# Patient Record
Sex: Female | Born: 1940
Health system: Southern US, Community
[De-identification: ages and names within clinical notes are randomized; demographics above are authoritative.]

## PROBLEM LIST (undated history)

## (undated) DIAGNOSIS — I341 Nonrheumatic mitral (valve) prolapse: Secondary | ICD-10-CM

## (undated) DIAGNOSIS — I350 Nonrheumatic aortic (valve) stenosis: Secondary | ICD-10-CM

## (undated) DIAGNOSIS — M1990 Unspecified osteoarthritis, unspecified site: Secondary | ICD-10-CM

## (undated) DIAGNOSIS — I420 Dilated cardiomyopathy: Secondary | ICD-10-CM

## (undated) DIAGNOSIS — B019 Varicella without complication: Secondary | ICD-10-CM

## (undated) DIAGNOSIS — I219 Acute myocardial infarction, unspecified: Secondary | ICD-10-CM

## (undated) DIAGNOSIS — D649 Anemia, unspecified: Secondary | ICD-10-CM

## (undated) DIAGNOSIS — I252 Old myocardial infarction: Secondary | ICD-10-CM

## (undated) DIAGNOSIS — I255 Ischemic cardiomyopathy: Secondary | ICD-10-CM

## (undated) DIAGNOSIS — I351 Nonrheumatic aortic (valve) insufficiency: Secondary | ICD-10-CM

## (undated) DIAGNOSIS — E7800 Pure hypercholesterolemia, unspecified: Secondary | ICD-10-CM

## (undated) DIAGNOSIS — IMO0001 Reserved for inherently not codable concepts without codable children: Secondary | ICD-10-CM

## (undated) HISTORY — DX: Nonrheumatic mitral (valve) prolapse: I34.1

## (undated) HISTORY — DX: Reserved for inherently not codable concepts without codable children: IMO0001

## (undated) HISTORY — DX: Dilated cardiomyopathy: I42.0

## (undated) HISTORY — DX: Pure hypercholesterolemia, unspecified: E78.00

## (undated) HISTORY — DX: Acute myocardial infarction, unspecified: I21.9

## (undated) HISTORY — DX: Old myocardial infarction: I25.2

## (undated) HISTORY — DX: Varicella without complication: B01.9

## (undated) HISTORY — DX: Ischemic cardiomyopathy: I25.5

## (undated) HISTORY — DX: Unspecified osteoarthritis, unspecified site: M19.90

## (undated) HISTORY — DX: Nonrheumatic aortic (valve) insufficiency: I35.1

## (undated) HISTORY — DX: Anemia, unspecified: D64.9

## (undated) HISTORY — DX: Nonrheumatic aortic (valve) stenosis: I35.0

---
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
## 1988-11-25 HISTORY — PX: CHOLECYSTECTOMY: SHX55

## 1989-11-25 HISTORY — PX: OTHER SURGICAL HISTORY: SHX169

## 1994-11-25 HISTORY — PX: OTHER SURGICAL HISTORY: SHX169

## 2000-04-02 ENCOUNTER — Other Ambulatory Visit: Admission: RE | Admit: 2000-04-02 | Discharge: 2000-04-02 | Payer: Self-pay | Admitting: Family Medicine

## 2000-06-04 ENCOUNTER — Encounter: Admission: RE | Admit: 2000-06-04 | Discharge: 2000-06-04 | Payer: Self-pay | Admitting: Family Medicine

## 2000-06-04 ENCOUNTER — Encounter: Payer: Self-pay | Admitting: Family Medicine

## 2001-07-31 ENCOUNTER — Encounter: Payer: Self-pay | Admitting: Family Medicine

## 2001-07-31 ENCOUNTER — Encounter: Admission: RE | Admit: 2001-07-31 | Discharge: 2001-07-31 | Payer: Self-pay | Admitting: Family Medicine

## 2001-12-04 ENCOUNTER — Other Ambulatory Visit: Admission: RE | Admit: 2001-12-04 | Discharge: 2001-12-04 | Payer: Self-pay | Admitting: Family Medicine

## 2003-08-02 ENCOUNTER — Encounter: Payer: Self-pay | Admitting: Family Medicine

## 2003-08-02 ENCOUNTER — Encounter: Admission: RE | Admit: 2003-08-02 | Discharge: 2003-08-02 | Payer: Self-pay | Admitting: Family Medicine

## 2004-06-14 ENCOUNTER — Other Ambulatory Visit: Admission: RE | Admit: 2004-06-14 | Discharge: 2004-06-14 | Payer: Self-pay | Admitting: Family Medicine

## 2004-06-14 ENCOUNTER — Encounter: Payer: Self-pay | Admitting: Family Medicine

## 2004-08-02 ENCOUNTER — Encounter: Admission: RE | Admit: 2004-08-02 | Discharge: 2004-08-02 | Payer: Self-pay | Admitting: Family Medicine

## 2004-10-09 ENCOUNTER — Ambulatory Visit: Payer: Self-pay | Admitting: Family Medicine

## 2004-11-25 DIAGNOSIS — I219 Acute myocardial infarction, unspecified: Secondary | ICD-10-CM

## 2004-11-25 HISTORY — DX: Acute myocardial infarction, unspecified: I21.9

## 2005-07-17 ENCOUNTER — Ambulatory Visit: Payer: Self-pay | Admitting: Family Medicine

## 2005-08-14 ENCOUNTER — Ambulatory Visit: Payer: Self-pay | Admitting: Family Medicine

## 2005-08-15 ENCOUNTER — Ambulatory Visit: Payer: Self-pay | Admitting: Family Medicine

## 2005-08-29 ENCOUNTER — Encounter (HOSPITAL_COMMUNITY): Admission: RE | Admit: 2005-08-29 | Discharge: 2005-11-27 | Payer: Self-pay | Admitting: Cardiology

## 2005-09-25 ENCOUNTER — Ambulatory Visit: Payer: Self-pay | Admitting: Family Medicine

## 2005-11-28 ENCOUNTER — Encounter (HOSPITAL_COMMUNITY): Admission: RE | Admit: 2005-11-28 | Discharge: 2006-02-26 | Payer: Self-pay | Admitting: Cardiology

## 2006-04-28 ENCOUNTER — Ambulatory Visit: Payer: Self-pay | Admitting: Family Medicine

## 2006-04-29 ENCOUNTER — Encounter: Admission: RE | Admit: 2006-04-29 | Discharge: 2006-04-29 | Payer: Self-pay | Admitting: Family Medicine

## 2006-10-15 ENCOUNTER — Ambulatory Visit: Payer: Self-pay | Admitting: Family Medicine

## 2006-12-03 ENCOUNTER — Encounter: Admission: RE | Admit: 2006-12-03 | Discharge: 2006-12-03 | Payer: Self-pay | Admitting: Family Medicine

## 2006-12-18 ENCOUNTER — Encounter: Admission: RE | Admit: 2006-12-18 | Discharge: 2006-12-18 | Payer: Self-pay | Admitting: Family Medicine

## 2007-04-28 ENCOUNTER — Encounter: Payer: Self-pay | Admitting: Family Medicine

## 2007-04-28 DIAGNOSIS — I10 Essential (primary) hypertension: Secondary | ICD-10-CM | POA: Insufficient documentation

## 2007-04-28 DIAGNOSIS — F329 Major depressive disorder, single episode, unspecified: Secondary | ICD-10-CM | POA: Insufficient documentation

## 2007-04-28 DIAGNOSIS — I059 Rheumatic mitral valve disease, unspecified: Secondary | ICD-10-CM

## 2007-04-28 DIAGNOSIS — E7800 Pure hypercholesterolemia, unspecified: Secondary | ICD-10-CM | POA: Insufficient documentation

## 2007-04-28 DIAGNOSIS — H9190 Unspecified hearing loss, unspecified ear: Secondary | ICD-10-CM | POA: Insufficient documentation

## 2007-04-29 ENCOUNTER — Ambulatory Visit: Payer: Self-pay | Admitting: Family Medicine

## 2007-04-29 DIAGNOSIS — M810 Age-related osteoporosis without current pathological fracture: Secondary | ICD-10-CM

## 2007-04-29 DIAGNOSIS — M25559 Pain in unspecified hip: Secondary | ICD-10-CM

## 2007-04-30 LAB — CONVERTED CEMR LAB
ALT: 22 units/L (ref 0–40)
Cholesterol: 222 mg/dL (ref 0–200)
HDL: 51.6 mg/dL (ref >39.0)
Total CHOL/HDL Ratio: 4.3

## 2007-06-08 ENCOUNTER — Encounter: Payer: Self-pay | Admitting: Family Medicine

## 2007-06-16 ENCOUNTER — Ambulatory Visit: Payer: Self-pay | Admitting: Internal Medicine

## 2007-06-22 ENCOUNTER — Encounter (INDEPENDENT_AMBULATORY_CARE_PROVIDER_SITE_OTHER): Payer: Self-pay | Admitting: *Deleted

## 2007-09-11 ENCOUNTER — Ambulatory Visit: Payer: Self-pay | Admitting: Family Medicine

## 2007-11-16 ENCOUNTER — Ambulatory Visit: Payer: Self-pay | Admitting: Family Medicine

## 2007-11-16 DIAGNOSIS — IMO0002 Reserved for concepts with insufficient information to code with codable children: Secondary | ICD-10-CM

## 2007-11-16 DIAGNOSIS — M25579 Pain in unspecified ankle and joints of unspecified foot: Secondary | ICD-10-CM

## 2007-11-17 ENCOUNTER — Inpatient Hospital Stay (HOSPITAL_COMMUNITY): Admission: RE | Admit: 2007-11-17 | Discharge: 2007-11-18 | Payer: Self-pay | Admitting: Orthopedic Surgery

## 2007-11-24 ENCOUNTER — Encounter: Payer: Self-pay | Admitting: Family Medicine

## 2007-11-26 HISTORY — PX: ANKLE SURGERY: SHX546

## 2007-12-02 ENCOUNTER — Encounter: Payer: Self-pay | Admitting: Family Medicine

## 2007-12-15 ENCOUNTER — Encounter: Payer: Self-pay | Admitting: Family Medicine

## 2008-01-19 ENCOUNTER — Encounter: Payer: Self-pay | Admitting: Family Medicine

## 2008-02-10 ENCOUNTER — Encounter: Payer: Self-pay | Admitting: Family Medicine

## 2008-02-25 ENCOUNTER — Encounter: Payer: Self-pay | Admitting: Family Medicine

## 2008-03-14 ENCOUNTER — Encounter: Payer: Self-pay | Admitting: Family Medicine

## 2008-04-11 ENCOUNTER — Encounter: Payer: Self-pay | Admitting: Family Medicine

## 2008-08-23 ENCOUNTER — Ambulatory Visit: Payer: Self-pay | Admitting: Family Medicine

## 2008-09-23 ENCOUNTER — Encounter: Payer: Self-pay | Admitting: Family Medicine

## 2009-09-05 ENCOUNTER — Ambulatory Visit: Payer: Self-pay | Admitting: Family Medicine

## 2009-10-11 ENCOUNTER — Ambulatory Visit: Payer: Self-pay | Admitting: Family Medicine

## 2010-08-21 ENCOUNTER — Ambulatory Visit: Payer: Self-pay | Admitting: Family Medicine

## 2010-12-16 ENCOUNTER — Encounter: Payer: Self-pay | Admitting: Family Medicine

## 2010-12-25 NOTE — Assessment & Plan Note (Signed)
Summary: FLU SHOT/CLE   Nurse Visit   Allergies: No Known Drug Allergies  Immunizations Administered:  Influenza Vaccine # 1:    Vaccine Type: Fluvax MCR    Site: left deltoid    Mfr: GlaxoSmithKline    Dose: 0.5 ml    Route: IM    Given by: Mervin Hack CMA (AAMA)    Exp. Date: 05/25/2011    Lot #: [id number]    VIS given: 06/19/10 version given August 21, 2010.  Flu Vaccine Consent Questions:    Do you have a history of severe allergic reactions to this vaccine? no    Any prior history of allergic reactions to egg and/or gelatin? no    Do you have a sensitivity to the preservative Thimersol? no    Do you have a past history of Guillan-Barre Syndrome? no    Do you currently have an acute febrile illness? no    Have you ever had a severe reaction to latex? no    Vaccine information given and explained to patient? yes    Are you currently pregnant? no  Orders Added: 1)  Influenza Vaccine MCR [00025]

## 2011-04-09 NOTE — Op Note (Signed)
NAMEANABEL, Latoya Parrish                ACCOUNT NO.:  [id number]   MEDICAL RECORD NO.:  [id number]          PATIENT TYPE:  INP   LOCATION:  5040                         FACILITY:  [facility]   PHYSICIAN:  Burnard Bunting, M.D.    DATE OF BIRTH:  02-Feb-1941   DATE OF PROCEDURE:  11/18/2007  DATE OF DISCHARGE:                               OPERATIVE REPORT   PREOPERATIVE DIAGNOSIS:  Right ankle syndesmotic injury with posterior  malleolar fracture.   POSTOPERATIVE DIAGNOSIS:  Right ankle syndesmotic injury with posterior  malleolar fracture.   PROCEDURE:  Open reduction, internal fixation of syndesmotic injury and  posterior malleolar injury.   SURGEON:  Burnard Bunting, M.D.   ASSISTANT:  None.   ANESTHESIA:  General endotracheal.   ESTIMATED BLOOD LOSS:  Minimal.   INDICATIONS:  [name] is a 70 year old female who injured her right  ankle today.  She presents, now, for operative management after  explanation of the risks and benefits.   PROCEDURE IN DETAIL:  The patient was brought to the operating room  where general endotracheal anesthesia was induced and preoperative  antibiotics were administered.  The right leg was prepped with DuraPrep  solution and draped in a sterile manner.  _ioban_________ was used to  cover the operative field.  Using a combination of internal rotation and  plantar flexion, the posterior malleolar fragment was reduced, the  syndesmosis was reduced.  Two screws were then placed across the  syndesmosis with a good fixation achieved.  Washers were used because  the bone quality was poor.  The mortise was reduced.  At this time, the  ankle was taken through a range of motion and the mortise was found to  be stable to lateral stress.  The incisions were then irrigated and  closed using 3-0 nylon suture.  Posterior splint was then applied.  The  patient tolerated the procedure well without immediate complications.      Burnard Bunting, M.D.  Electronically  Signed     GSD/MEDQ  D:  11/18/2007  T:  11/18/2007  Job:  [id number]

## 2011-08-28 ENCOUNTER — Ambulatory Visit (INDEPENDENT_AMBULATORY_CARE_PROVIDER_SITE_OTHER): Payer: Medicare Other

## 2011-08-28 DIAGNOSIS — Z23 Encounter for immunization: Secondary | ICD-10-CM

## 2011-08-30 LAB — DIFFERENTIAL
Basophils Absolute: 0
Eosinophils Absolute: 0.1 — ABNORMAL LOW
Monocytes Relative: 10
Neutro Abs: 7

## 2011-08-30 LAB — BASIC METABOLIC PANEL
CO2: 23
Calcium: 8.6
GFR calc non Af Amer: >60
Potassium: 3.6
Sodium: 136

## 2011-08-30 LAB — CBC
HCT: 32.2 — ABNORMAL LOW
Hemoglobin: 10.7 — ABNORMAL LOW
MCHC: 33.1
MCV: 84
Platelets: 226
RDW: 13.6

## 2012-05-27 ENCOUNTER — Encounter: Payer: Self-pay | Admitting: Family Medicine

## 2012-05-27 ENCOUNTER — Ambulatory Visit (INDEPENDENT_AMBULATORY_CARE_PROVIDER_SITE_OTHER): Payer: Medicare Other | Admitting: Family Medicine

## 2012-05-27 ENCOUNTER — Ambulatory Visit (INDEPENDENT_AMBULATORY_CARE_PROVIDER_SITE_OTHER)
Admission: RE | Admit: 2012-05-27 | Discharge: 2012-05-27 | Disposition: A | Discharge status: Home / Self Care | Payer: Medicare Other | Source: Ambulatory Visit | Attending: Family Medicine | Admitting: Family Medicine

## 2012-05-27 VITALS — BP 146/78 | HR 69 | Temp 97.5°F | Ht 69.0 in | Wt 168.0 lb

## 2012-05-27 DIAGNOSIS — M25552 Pain in left hip: Secondary | ICD-10-CM

## 2012-05-27 DIAGNOSIS — I10 Essential (primary) hypertension: Secondary | ICD-10-CM

## 2012-05-27 DIAGNOSIS — M25551 Pain in right hip: Secondary | ICD-10-CM

## 2012-05-27 DIAGNOSIS — M25559 Pain in unspecified hip: Secondary | ICD-10-CM

## 2012-05-27 DIAGNOSIS — M810 Age-related osteoporosis without current pathological fracture: Secondary | ICD-10-CM

## 2012-05-27 DIAGNOSIS — Z1231 Encounter for screening mammogram for malignant neoplasm of breast: Secondary | ICD-10-CM

## 2012-05-27 DIAGNOSIS — E7800 Pure hypercholesterolemia, unspecified: Secondary | ICD-10-CM

## 2012-05-27 NOTE — Assessment & Plan Note (Signed)
mammo screening scheduled

## 2012-05-27 NOTE — Progress Notes (Signed)
Subjective:    Patient ID: Latoya Parrish, female    DOB: February 12, 1941, 71 y.o.   MRN: [id number]  HPI Here to re establish Has not been here since 2010  Lost her husband last May- is doing ok overall  Is still living in her house - doing a lot of new things on her own More physical labor to care for her house Now having more hip problems and pain - both hips and legs and occ to her knees  Arms hurt at times - because she is using them to push her up   Hx of MI in past- sees Dr Mayford Knife every 6 months  Given her permission to use the aleve once in a while - if bp is well controlled   Also hyperlipidemia- is so /so -- has struggled with cholesterol meds  lipitor no help/ crestor gave her pain -- off of all of it right now   Also HTN- has been in good control - gets white coat syndrome now   Is due for dexa for osteoporosis  Wants vit D test  She takes her ca and vit D Last fx was ankle in 2009 No OP drugs right now  Was on fosamax for a while - caused her some "dental problems"   Needs to do mammo also   Is interested in shingles vaccine   Patient Active Problem List  Diagnosis  . HYPERCHOLESTEROLEMIA  . DEPRESSION  . DECREASED HEARING, LEFT EAR  . HYPERTENSION  . MYOCARDIAL INFARCTION, HX OF  . MITRAL VALVE PROLAPSE  . HIP PAIN  . ANKLE PAIN, RIGHT  . OSTEOPOROSIS NOS  . FRACTURE, TIBIA   Past Medical History  Diagnosis Date  . Anemia   . Arthritis   . Chicken pox   . Arterial disease   . White coat hypertension   . High cholesterol   . Heart attack 2006   Past Surgical History  Procedure Date  . Cholecystectomy 1990  . Tonsillectomy and adenoidectomy 1946  . Acoustic neuroma  1991  . Ankle surgery 2009   History  Substance Use Topics  . Smoking status: Former Games developer  . Smokeless tobacco: Not on file  . Alcohol Use: Yes   No family history on file. No Known Allergies Current Outpatient Prescriptions on File Prior to Visit  Medication Sig Dispense  Refill  . Calcium Carbonate-Vitamin D (CALCIUM-VITAMIN D) 500-200 MG-UNIT per tablet Take 1 tablet by mouth 2 (two) times daily with a meal.      . calcium citrate-vitamin D (CITRACAL+D) 315-200 MG-UNIT per tablet Take 1 tablet by mouth 2 (two) times daily.      . carvedilol (COREG) 6.25 MG tablet Take 6.25 mg by mouth 2 (two) times daily with a meal.      . valsartan (DIOVAN) 160 MG tablet Take 160 mg by mouth daily.            Review of Systems Review of Systems  Constitutional: Negative for fever, appetite change, fatigue and unexpected weight change.  Eyes: Negative for pain and visual disturbance.  Respiratory: Negative for cough and shortness of breath.   Cardiovascular: Negative for cp or palpitations    Gastrointestinal: Negative for nausea, diarrhea and constipation.  Genitourinary: Negative for urgency and frequency.  Skin: Negative for pallor or rash  MSK pos for hip and leg pain bilat, neg for red or swollen joints   Neurological: Negative for weakness, light-headedness, numbness and headaches.  Hematological: Negative for adenopathy. Does  not bruise/bleed easily.  Psychiatric/Behavioral: Negative for dysphoric mood. The patient is not nervous/anxious.         Objective:   Physical Exam  Constitutional: She appears well-developed and well-nourished. No distress.  HENT:  Head: Normocephalic and atraumatic.  Right Ear: External ear normal.  Left Ear: External ear normal.  Mouth/Throat: Oropharynx is clear and moist.  Eyes: Conjunctivae and EOM are normal. Pupils are equal, round, and reactive to light. No scleral icterus.  Neck: Normal range of motion. Neck supple. No JVD present. Carotid bruit is not present. No thyromegaly present.  Cardiovascular: Normal rate, regular rhythm, normal heart sounds and intact distal pulses.  Exam reveals no gallop.   Pulmonary/Chest: Effort normal and breath sounds normal. No respiratory distress. She has no wheezes.  Abdominal: Soft.  Bowel sounds are normal. She exhibits no distension, no abdominal bruit and no mass. There is no tenderness.  Musculoskeletal: She exhibits tenderness. She exhibits no edema.       Pain in groin bilaterally (worse on R) with leg movement - especially internal and external rotation of the hip No trochanteric or back tenderness No swollen joints or skin changes   Lymphadenopathy:    She has no cervical adenopathy.  Neurological: She is alert. She has normal reflexes. She displays no atrophy and no tremor. No cranial nerve deficit or sensory deficit. She exhibits normal muscle tone. Coordination normal.  Skin: Skin is warm and dry. No rash noted. No erythema. No pallor.  Psychiatric: She has a normal mood and affect.          Assessment & Plan:

## 2012-05-27 NOTE — Assessment & Plan Note (Signed)
Per pt lipids are up and down - unable to tolerate statins thus far Still working with cardiology  Eating low sat fat diet

## 2012-05-27 NOTE — Assessment & Plan Note (Signed)
Pt had 2-3 years of fosamax and then stopped due to dental problems Will check dexa  Disc ca and D - will check labs at next visit  Disc safety F/u after dexa ? If candidate for evista (has hx of MI)

## 2012-05-27 NOTE — Assessment & Plan Note (Signed)
Suspect OA Xray today and adv Aleve prn if ok with cardiol-pt aware to take with food

## 2012-05-27 NOTE — Assessment & Plan Note (Signed)
Pt has white coat syndrome but overall has been well controlled Continues to see cardiology  No change in medicines  Will disc doing lab work next time

## 2012-05-27 NOTE — Patient Instructions (Addendum)
Hip xray on the way out  We will schedule bone density and mammogram at check out  Follow up with me about 2-3 weeks after bone density test If you are interested in a shingles/zoster vaccine - call your insurance to check on coverage,( you should not get it within 1 month of other vaccines) , then call us for a prescription  for it to take to a pharmacy that gives the shot

## 2012-06-10 ENCOUNTER — Ambulatory Visit
Admission: RE | Admit: 2012-06-10 | Discharge: 2012-06-10 | Disposition: A | Discharge status: Home / Self Care | Payer: Medicare Other | Source: Ambulatory Visit | Attending: Family Medicine | Admitting: Family Medicine

## 2012-06-10 DIAGNOSIS — Z1231 Encounter for screening mammogram for malignant neoplasm of breast: Secondary | ICD-10-CM

## 2012-06-10 DIAGNOSIS — M810 Age-related osteoporosis without current pathological fracture: Secondary | ICD-10-CM

## 2012-06-16 ENCOUNTER — Other Ambulatory Visit: Payer: Self-pay | Admitting: Family Medicine

## 2012-06-16 DIAGNOSIS — R928 Other abnormal and inconclusive findings on diagnostic imaging of breast: Secondary | ICD-10-CM

## 2012-06-29 ENCOUNTER — Encounter: Payer: Self-pay | Admitting: Family Medicine

## 2012-06-29 ENCOUNTER — Ambulatory Visit (INDEPENDENT_AMBULATORY_CARE_PROVIDER_SITE_OTHER): Payer: Medicare Other | Admitting: Family Medicine

## 2012-06-29 VITALS — BP 132/70 | HR 77 | Temp 98.7°F | Wt 166.8 lb

## 2012-06-29 DIAGNOSIS — M25551 Pain in right hip: Secondary | ICD-10-CM

## 2012-06-29 DIAGNOSIS — M25559 Pain in unspecified hip: Secondary | ICD-10-CM

## 2012-06-29 DIAGNOSIS — M810 Age-related osteoporosis without current pathological fracture: Secondary | ICD-10-CM

## 2012-06-29 DIAGNOSIS — Z23 Encounter for immunization: Secondary | ICD-10-CM

## 2012-06-29 MED ORDER — RALOXIFENE HCL 60 MG PO TABS: 60.0000 mg | ORAL_TABLET | Freq: Every day | ORAL | Status: AC

## 2012-06-29 NOTE — Patient Instructions (Addendum)
Try evista- let me know if any problems  Keep as active as possible  Checking vit D level today Pneumonia vaccine today  Let me know if hip pain worsens and you want to see orthopedics

## 2012-06-29 NOTE — Assessment & Plan Note (Signed)
Rev dexa with pt  OP at FN Is worse than last one Cannot take bisphosphenates Will try evista 60 mg daily- disc poss side eff - goal 5 years dexa at 2 y Disc safety Vit D level today Enc to keep up exercise

## 2012-06-29 NOTE — Progress Notes (Signed)
Subjective:    Patient ID: Latoya Parrish, female    DOB: [birth year], 71 y.o.   MRN: [id number]  HPI Here to f/u for OP and hip pain   OP is worse in hip - T score -2.7 Ankle fracture is only one - 2009-- was a major injury - slip and fall  Worse dexa than 2008 Fosamax - not do able due to dental problems  She is interested in evista   Is taking her ca and D  Exercise - is joining a health club  Will start using machines with a trainer   Hip films at last visit did show OA of right hip  She is taking aleve every day -- is ok with cardiology , and no GI effects at all and bp is stable  She is up a night with pain occ-- ? If can try tylenol PM   Has not had labs in a while here Does with Dr Mayford Knife  Cholesterol/ liver   Needs a vit D level   Patient Active Problem List  Diagnosis  . HYPERCHOLESTEROLEMIA  . DEPRESSION  . DECREASED HEARING, LEFT EAR  . HYPERTENSION  . MYOCARDIAL INFARCTION, HX OF  . MITRAL VALVE PROLAPSE  . HIP PAIN  . ANKLE PAIN, RIGHT  . OSTEOPOROSIS NOS  . FRACTURE, TIBIA  . Hip pain, bilateral  . Other screening mammogram   Past Medical History  Diagnosis Date  . Anemia   . Arthritis   . Chicken pox   . Arterial disease   . White coat hypertension   . High cholesterol   . Heart attack 2006   Past Surgical History  Procedure Date  . Cholecystectomy 1990  . Tonsillectomy and adenoidectomy 1946  . Acoustic neuroma  1991  . Ankle surgery 2009   History  Substance Use Topics  . Smoking status: Former Games developer  . Smokeless tobacco: Not on file  . Alcohol Use: Yes   No family history on file. Allergies  Allergen Reactions  . Fosamax (Alendronate Sodium)     Dental problems  . Statins     Muscle pain    Current Outpatient Prescriptions on File Prior to Visit  Medication Sig Dispense Refill  . aspirin 81 MG tablet Take 81 mg by mouth daily.      . Calcium Carbonate-Vitamin D (CALCIUM-VITAMIN D) 500-200 MG-UNIT per tablet Take 1 tablet  by mouth daily.       . carvedilol (COREG) 6.25 MG tablet Take 6.25 mg by mouth 2 (two) times daily with a meal.      . naproxen sodium (ANAPROX) 220 MG tablet Take 220 mg by mouth daily.       . valsartan (DIOVAN) 160 MG tablet Take 160 mg by mouth daily.            Review of Systems Review of Systems  Constitutional: Negative for fever, appetite change, fatigue and unexpected weight change.  Eyes: Negative for pain and visual disturbance.  Respiratory: Negative for cough and shortness of breath.   Cardiovascular: Negative for cp or palpitations    Gastrointestinal: Negative for nausea, diarrhea and constipation.  Genitourinary: Negative for urgency and frequency.  Skin: Negative for pallor or rash   MSK pos for hip pain,neg for back pain  Neurological: Negative for weakness, light-headedness, numbness and headaches.  Hematological: Negative for adenopathy. Does not bruise/bleed easily.  Psychiatric/Behavioral: Negative for dysphoric mood. The patient is not nervous/anxious.  Objective:   Physical Exam  Constitutional: She appears well-developed and well-nourished.  HENT:  Head: Normocephalic and atraumatic.  Eyes: Conjunctivae and EOM are normal. Pupils are equal, round, and reactive to light.  Neck: Normal range of motion. Neck supple.  Cardiovascular: Normal rate and regular rhythm.   Pulmonary/Chest: Effort normal and breath sounds normal.  Musculoskeletal: She exhibits no edema and no tenderness.       Mild kyphosis Limited rom of hips  Nl gait   Lymphadenopathy:    She has no cervical adenopathy.  Neurological: She is alert. She has normal reflexes.  Skin: Skin is warm and dry. No pallor.  Psychiatric: She has a normal mood and affect.          Assessment & Plan:

## 2012-06-29 NOTE — Assessment & Plan Note (Signed)
Pt is getting by with aleve one a day and her cardiologist is ok with that/ also no GI symptoms  Will call for ortho ref if this gets worse Will continue to exercise as tolerated

## 2012-06-30 LAB — VITAMIN D 25 HYDROXY (VIT D DEFICIENCY, FRACTURES): Vit D, 25-Hydroxy: 45 ng/mL (ref 30–89)

## 2012-09-04 ENCOUNTER — Ambulatory Visit (INDEPENDENT_AMBULATORY_CARE_PROVIDER_SITE_OTHER): Payer: Medicare Other

## 2012-09-04 DIAGNOSIS — Z23 Encounter for immunization: Secondary | ICD-10-CM

## 2013-05-20 ENCOUNTER — Telehealth: Payer: Self-pay | Admitting: Family Medicine

## 2013-05-20 NOTE — Telephone Encounter (Signed)
appt made for tomorrow with dr Alphonsus Sias

## 2013-05-20 NOTE — Telephone Encounter (Signed)
Non-urgent f/u is indicated. F/u tomorrow in our office.

## 2013-05-20 NOTE — Telephone Encounter (Signed)
Patient Information:  Caller Name: Laquilla  Phone: [phone]  Patient: Latoya Parrish, Latoya Parrish  Gender: Female  DOB: 11-Jan-1941  Age: 72 Years  PCP: Roxy Manns [facility])  Office Follow Up:  Does the office need to follow up with this patient?: Yes  Instructions For The Office: Urgent call back needed re MD approval to be seen in office vs ED.  RN Note:  Left leg weakness present; worried leg will give out.  Walking with limp; favoring left leg. Advised to stay off leg until hears back from office staff regarrding appointment.  Urgent message sent to office staff for MD approval to be seen in office vs ED.  Please call back.  Symptoms  Reason For Call & Symptoms: Moderate "sciatica" pain in left leg from hip to ankle with low back pain. Dull hip/thigh pain and back and sharp in ankle.  Back pain rated 5/10 when walking and no back pain when sitting. Pain worsen when ambulatory or sleeps; pain stops when sits.  Reviewed Health History In EMR: Yes  Reviewed Medications In EMR: Yes  Reviewed Allergies In EMR: Yes  Reviewed Surgeries / Procedures: Yes  Date of Onset of Symptoms: 05/17/2013  Treatments Tried: Aleve  Treatments Tried Worked: Yes  Guideline(s) Used:  Leg Pain  Back Pain  Disposition Per Guideline:   Go to ED Now (or to Office with PCP Approval)  Reason For Disposition Reached:   Weakness of a leg or foot (e.g., unable to bear weight, dragging foot)  Advice Given:  Call Back If:  You become worse.  RN Overrode Recommendation:  Follow Up With Office Later  Urgent message sent to office for MD approval to be seen in office.

## 2013-05-21 ENCOUNTER — Encounter: Payer: Self-pay | Admitting: Internal Medicine

## 2013-05-21 ENCOUNTER — Ambulatory Visit (INDEPENDENT_AMBULATORY_CARE_PROVIDER_SITE_OTHER): Payer: Medicare Other | Admitting: Internal Medicine

## 2013-05-21 VITALS — BP 180/80 | HR 69 | Temp 98.1°F | Wt 167.0 lb

## 2013-05-21 DIAGNOSIS — M5430 Sciatica, unspecified side: Secondary | ICD-10-CM

## 2013-05-21 DIAGNOSIS — M5432 Sciatica, left side: Secondary | ICD-10-CM

## 2013-05-21 MED ORDER — HYDROCODONE-ACETAMINOPHEN 5-325 MG PO TABS: 1.0000 | ORAL_TABLET | Freq: Every evening | ORAL | Status: DC | PRN

## 2013-05-21 MED ORDER — PREDNISONE 20 MG PO TABS: 40.0000 mg | ORAL_TABLET | Freq: Every day | ORAL | Status: DC

## 2013-05-21 NOTE — Progress Notes (Signed)
  Subjective:    Patient ID: Latoya Parrish, female    DOB: 1941-04-13, 72 y.o.   MRN: [id number]  HPI Here with daughter Terrall Laity with pain "out of the blue" 4 days ago Initially both hips then concentrated on left hip and down leg Now mostly in left calf and ankle Some back pain at first---now mostly gone  Some weakness in left leg Trouble even standing up---mostly from the pain  No disc problems in past No recent injury  Has tried aleve-- 2 daily---without help Tylenol not helpful either  Current Outpatient Prescriptions on File Prior to Visit  Medication Sig Dispense Refill  . aspirin 81 MG tablet Take 81 mg by mouth daily.      . Calcium Carbonate-Vitamin D (CALCIUM-VITAMIN D) 500-200 MG-UNIT per tablet Take 1 tablet by mouth daily.       . carvedilol (COREG) 6.25 MG tablet Take 6.25 mg by mouth 2 (two) times daily with a meal.      . naproxen sodium (ANAPROX) 220 MG tablet Take 220 mg by mouth daily.       . valsartan (DIOVAN) 160 MG tablet Take 160 mg by mouth daily.       No current facility-administered medications on file prior to visit.    Allergies  Allergen Reactions  . Fosamax (Alendronate Sodium)     Dental problems  . Statins     Muscle pain     Past Medical History  Diagnosis Date  . Anemia   . Arthritis   . Chicken pox   . Arterial disease   . White coat hypertension   . High cholesterol   . Heart attack 2006    Past Surgical History  Procedure Laterality Date  . Cholecystectomy  1990  . Tonsillectomy and adenoidectomy  1946  . Acoustic neuroma   1991  . Ankle surgery  2009    No family history on file.  History   Social History  . Marital Status: Married    Spouse Name: N/A    Number of Children: N/A  . Years of Education: N/A   Occupational History  . teacher    Social History Main Topics  . Smoking status: Former Games developer  . Smokeless tobacco: Not on file  . Alcohol Use: Yes  . Drug Use: No  . Sexually Active: Not on  file   Other Topics Concern  . Not on file   Social History Narrative   Widowed in 2012    Working part time   Review of Systems No trouble with bowels No change in urine or loss of control     Objective:   Physical Exam  Musculoskeletal:  SLR basically negative bilaterally No spine or low back tenderness Slight pain with full internal rotation of hips  Neurological:  Gait is slow but not antalgic ?slight weakness in flexion at left knee but not definitive ?slightly decreased patellar reflex on left also          Assessment & Plan:

## 2013-05-21 NOTE — Patient Instructions (Addendum)
Please increase the aleve to 2 tabs twice a day. Call for reevaluation if you worsen (especially with change in bowel or bladder function or increased weakness)  Sciatica Sciatica is pain, weakness, numbness, or tingling along the path of the sciatic nerve. The nerve starts in the lower back and runs down the back of each leg. The nerve controls the muscles in the lower leg and in the back of the knee, while also providing sensation to the back of the thigh, lower leg, and the sole of your foot. Sciatica is a symptom of another medical condition. For instance, nerve damage or certain conditions, such as a herniated disk or bone spur on the spine, pinch or put pressure on the sciatic nerve. This causes the pain, weakness, or other sensations normally associated with sciatica. Generally, sciatica only affects one side of the body. CAUSES   Herniated or slipped disc.  Degenerative disk disease.  A pain disorder involving the narrow muscle in the buttocks (piriformis syndrome).  Pelvic injury or fracture.  Pregnancy.  Tumor (rare). SYMPTOMS  Symptoms can vary from mild to very severe. The symptoms usually travel from the low back to the buttocks and down the back of the leg. Symptoms can include:  Mild tingling or dull aches in the lower back, leg, or hip.  Numbness in the back of the calf or sole of the foot.  Burning sensations in the lower back, leg, or hip.  Sharp pains in the lower back, leg, or hip.  Leg weakness.  Severe back pain inhibiting movement. These symptoms may get worse with coughing, sneezing, laughing, or prolonged sitting or standing. Also, being overweight may worsen symptoms. DIAGNOSIS  Your caregiver will perform a physical exam to look for common symptoms of sciatica. He or she may ask you to do certain movements or activities that would trigger sciatic nerve pain. Other tests may be performed to find the cause of the sciatica. These may include:  Blood  tests.  X-rays.  Imaging tests, such as an MRI or CT scan. TREATMENT  Treatment is directed at the cause of the sciatic pain. Sometimes, treatment is not necessary and the pain and discomfort goes away on its own. If treatment is needed, your caregiver may suggest:  Over-the-counter medicines to relieve pain.  Prescription medicines, such as anti-inflammatory medicine, muscle relaxants, or narcotics.  Applying heat or ice to the painful area.  Steroid injections to lessen pain, irritation, and inflammation around the nerve.  Reducing activity during periods of pain.  Exercising and stretching to strengthen your abdomen and improve flexibility of your spine. Your caregiver may suggest losing weight if the extra weight makes the back pain worse.  Physical therapy.  Surgery to eliminate what is pressing or pinching the nerve, such as a bone spur or part of a herniated disk. HOME CARE INSTRUCTIONS   Only take over-the-counter or prescription medicines for pain or discomfort as directed by your caregiver.  Apply ice to the affected area for 20 minutes, 3 4 times a day for the first 48 72 hours. Then try heat in the same way.  Exercise, stretch, or perform your usual activities if these do not aggravate your pain.  Attend physical therapy sessions as directed by your caregiver.  Keep all follow-up appointments as directed by your caregiver.  Do not wear high heels or shoes that do not provide proper support.  Check your mattress to see if it is too soft. A firm mattress may lessen your  pain and discomfort. SEEK IMMEDIATE MEDICAL CARE IF:   You lose control of your bowel or bladder (incontinence).  You have increasing weakness in the lower back, pelvis, buttocks, or legs.  You have redness or swelling of your back.  You have a burning sensation when you urinate.  You have pain that gets worse when you lie down or awakens you at night.  Your pain is worse than you have  experienced in the past.  Your pain is lasting longer than 4 weeks.  You are suddenly losing weight without reason. MAKE SURE YOU:  Understand these instructions.  Will watch your condition.  Will get help right away if you are not doing well or get worse. Document Released: 11/05/2001 Document Revised: 05/12/2012 Document Reviewed: 03/22/2012 [facility] Patient Information 2014 [place], [place].

## 2013-05-21 NOTE — Assessment & Plan Note (Signed)
No worrisome neurologic findings Will try course of prednisone Increase aleve to 2 bid Hydrocodone for sleep reeval if worsening

## 2013-06-25 ENCOUNTER — Encounter: Payer: Self-pay | Admitting: Radiology

## 2013-06-28 ENCOUNTER — Ambulatory Visit (INDEPENDENT_AMBULATORY_CARE_PROVIDER_SITE_OTHER)
Admission: RE | Admit: 2013-06-28 | Discharge: 2013-06-28 | Disposition: A | Discharge status: Home / Self Care | Payer: Medicare Other | Source: Ambulatory Visit | Attending: Family Medicine | Admitting: Family Medicine

## 2013-06-28 ENCOUNTER — Ambulatory Visit (INDEPENDENT_AMBULATORY_CARE_PROVIDER_SITE_OTHER): Payer: Medicare Other | Admitting: Family Medicine

## 2013-06-28 ENCOUNTER — Encounter: Payer: Self-pay | Admitting: Family Medicine

## 2013-06-28 VITALS — BP 144/90 | HR 73 | Temp 98.4°F | Ht 69.0 in | Wt 171.5 lb

## 2013-06-28 DIAGNOSIS — M5430 Sciatica, unspecified side: Secondary | ICD-10-CM

## 2013-06-28 DIAGNOSIS — M5432 Sciatica, left side: Secondary | ICD-10-CM

## 2013-06-28 DIAGNOSIS — M25473 Effusion, unspecified ankle: Secondary | ICD-10-CM

## 2013-06-28 DIAGNOSIS — S8391XA Sprain of unspecified site of right knee, initial encounter: Secondary | ICD-10-CM | POA: Insufficient documentation

## 2013-06-28 DIAGNOSIS — IMO0002 Reserved for concepts with insufficient information to code with codable children: Secondary | ICD-10-CM

## 2013-06-28 DIAGNOSIS — M25471 Effusion, right ankle: Secondary | ICD-10-CM

## 2013-06-28 NOTE — Assessment & Plan Note (Signed)
Pedal edema in previously injured foot- may be in part to wearing a tight knee brace in the setting of verous insuff Will take off brace Elevate/ avoid sodium Update if no improvement

## 2013-06-28 NOTE — Assessment & Plan Note (Signed)
Nl exam today Seems to be improved Will update if no further improvement

## 2013-06-28 NOTE — Progress Notes (Signed)
Subjective:    Patient ID: Latoya Parrish, female    DOB: 1941/09/05, 72 y.o.   MRN: [id number]  HPI Here for sciatica on L and also injured knee on L Was seen 6/27- given prednisone , no neuro findings  Since then is improved - the leg pain improved  However - has some tingling in her toes of L foot and top of her foot Shoes feel funny / (? Feels a bit swollen)  Back feels fine for the most part   No motor change/ loss of strength   No loss of bowel or bladder continence   She twisted her knee 2 weeks ago  She has it wrapped  Makes her foot swollen - since last Thursday  Has hx of bakers cyst- ? Broke ? This is ankle she broke in the past  Knee itself is doing better   Aleve- occasionally  Needs urine drug screen- no norco in several days-she takes it infrequently  Patient Active Problem List   Diagnosis Date Noted  . Sciatica of left side 05/21/2013  . Hip pain, bilateral 05/27/2012  . Other screening mammogram 05/27/2012  . ANKLE PAIN, RIGHT 11/16/2007  . FRACTURE, TIBIA 11/16/2007  . HIP PAIN 04/29/2007  . OSTEOPOROSIS NOS 04/29/2007  . HYPERCHOLESTEROLEMIA 04/28/2007  . DEPRESSION 04/28/2007  . DECREASED HEARING, LEFT EAR 04/28/2007  . HYPERTENSION 04/28/2007  . MYOCARDIAL INFARCTION, HX OF 04/28/2007  . MITRAL VALVE PROLAPSE 04/28/2007   Past Medical History  Diagnosis Date  . Anemia   . Arthritis   . Chicken pox   . Arterial disease   . White coat hypertension   . High cholesterol   . Heart attack 2006   Past Surgical History  Procedure Laterality Date  . Cholecystectomy  1990  . Tonsillectomy and adenoidectomy  1946  . Acoustic neuroma   1991  . Ankle surgery  2009   History  Substance Use Topics  . Smoking status: Former Games developer  . Smokeless tobacco: Not on file  . Alcohol Use: Yes     Comment: occ   No family history on file. Allergies  Allergen Reactions  . Fosamax (Alendronate Sodium)     Dental problems  . Statins     Muscle pain     Current Outpatient Prescriptions on File Prior to Visit  Medication Sig Dispense Refill  . aspirin 81 MG tablet Take 81 mg by mouth daily.      . Calcium Carbonate-Vitamin D (CALCIUM-VITAMIN D) 500-200 MG-UNIT per tablet Take 1 tablet by mouth daily.       . carvedilol (COREG) 6.25 MG tablet Take 6.25 mg by mouth 2 (two) times daily with a meal.      . HYDROcodone-acetaminophen (NORCO/VICODIN) 5-325 MG per tablet Take 1 tablet by mouth at bedtime as needed for pain.  30 tablet  0  . naproxen sodium (ANAPROX) 220 MG tablet Take 220 mg by mouth as needed.       . valsartan (DIOVAN) 160 MG tablet Take 160 mg by mouth daily.       No current facility-administered medications on file prior to visit.        Review of Systems Review of Systems  Constitutional: Negative for fever, appetite change, fatigue and unexpected weight change.  Eyes: Negative for pain and visual disturbance.  Respiratory: Negative for cough and shortness of breath.   Cardiovascular: Negative for cp or palpitations    Gastrointestinal: Negative for nausea, diarrhea and constipation.  Genitourinary: Negative  for urgency and frequency.  Skin: Negative for pallor or rash   MSK pos for knee soreness and R pedal edema , pos for L foot tingling and low back pain  Neurological: Negative for weakness, light-headedness, numbness and headaches.  Hematological: Negative for adenopathy. Does not bruise/bleed easily.  Psychiatric/Behavioral: Negative for dysphoric mood. The patient is not nervous/anxious.         Objective:   Physical Exam  Constitutional: She appears well-developed and well-nourished. No distress.  Eyes: Conjunctivae and EOM are normal. Pupils are equal, round, and reactive to light.  Neck: Normal range of motion. Neck supple. No JVD present.  Cardiovascular: Normal rate and regular rhythm.   Pulmonary/Chest: Effort normal and breath sounds normal.  No crackles  Musculoskeletal: She exhibits edema and  tenderness.  R pedal edema one plus (with deformity from old ankle fx) No R knee eff/ edema/tenderness today  Good rom knee  L foot-nl sens on exam   LS - tender over upper lumbar spine  Neurological: She is alert. She has normal strength and normal reflexes. No cranial nerve deficit or sensory deficit. She exhibits normal muscle tone. Coordination and gait normal.  Skin: Skin is warm and dry. No rash noted. No erythema. No pallor.  Psychiatric: She has a normal mood and affect.          Assessment & Plan:

## 2013-06-28 NOTE — Patient Instructions (Addendum)
Xray of back today If ok we can consider some PT  Elevate right foot when you can/ stop the knee brace and wear a support stocking  Drink water and avoid salt as much as you can

## 2013-06-28 NOTE — Assessment & Plan Note (Signed)
Overall improved but continues to have intermittent tingling in L foot  Disc opt for tx -interested in PT  Nl exam  Xray today and then PT if ok

## 2013-07-07 ENCOUNTER — Encounter: Payer: Self-pay | Admitting: Family Medicine

## 2013-07-21 ENCOUNTER — Encounter: Payer: Self-pay | Admitting: *Deleted

## 2013-09-02 ENCOUNTER — Ambulatory Visit (INDEPENDENT_AMBULATORY_CARE_PROVIDER_SITE_OTHER): Payer: Medicare Other

## 2013-09-02 DIAGNOSIS — Z23 Encounter for immunization: Secondary | ICD-10-CM

## 2013-11-01 ENCOUNTER — Ambulatory Visit: Payer: Medicare Other | Admitting: Family Medicine

## 2013-11-09 ENCOUNTER — Ambulatory Visit (INDEPENDENT_AMBULATORY_CARE_PROVIDER_SITE_OTHER): Payer: Medicare Other | Admitting: Family Medicine

## 2013-11-09 ENCOUNTER — Encounter: Payer: Self-pay | Admitting: Family Medicine

## 2013-11-09 VITALS — BP 122/74 | HR 73 | Temp 97.9°F | Ht 69.0 in | Wt 172.0 lb

## 2013-11-09 DIAGNOSIS — Z9889 Other specified postprocedural states: Secondary | ICD-10-CM

## 2013-11-09 DIAGNOSIS — R42 Dizziness and giddiness: Secondary | ICD-10-CM | POA: Insufficient documentation

## 2013-11-09 DIAGNOSIS — Z86018 Personal history of other benign neoplasm: Secondary | ICD-10-CM | POA: Insufficient documentation

## 2013-11-09 DIAGNOSIS — E785 Hyperlipidemia, unspecified: Secondary | ICD-10-CM

## 2013-11-09 LAB — LDL CHOLESTEROL, DIRECT: Direct LDL: 187.9 mg/dL

## 2013-11-09 LAB — LIPID PANEL
HDL: 45.1 mg/dL (ref >39.00)
Triglycerides: 171 mg/dL — ABNORMAL HIGH (ref 0.0–149.0)

## 2013-11-09 LAB — ALT: ALT: 16 U/L (ref 0–35)

## 2013-11-09 LAB — AST: AST: 16 U/L (ref 0–37)

## 2013-11-09 MED ORDER — ZOSTER VACCINE LIVE 19400 UNT/0.65ML ~~LOC~~ SOLR: 0.6500 mL | Freq: Once | SUBCUTANEOUS | Status: DC

## 2013-11-09 NOTE — Patient Instructions (Addendum)
Labs today  We will do ENT referral at check out  Avoid red meat/ fried foods/ egg yolks/ fatty breakfast meats/ butter, cheese and high fat dairy/ and shellfish

## 2013-11-09 NOTE — Progress Notes (Signed)
Subjective:    Patient ID: Latoya Parrish, female    DOB: 04/05/1941, 72 y.o.   MRN: [id number]  HPI Here with problems with balance - her dizziness is back -she is worried about her hx of  acoustic neuroma -- (it was large and there was trauma from the surgery) This was in 1991 - then gamma knife surg in 1996  peroidically had MRI to follow - they got everything at that point   She thinks she tends to veer off to the left -- tends to tip that way  Sometimes some positional symptoms / better when still  No dizziness to roll over in bed at night   She rarely gets any spinning sensation- occasionally   No headaches or new hearing problems  Chronic sinus issues - always on the left   She wonders if her sciatica plays a role   No falls - she walks closely  Has a little head tremor   Needs to have chol checked Was on vytorin from her cardiologist in past- $$$ lipitor and zetia alone did not work  crestor gave her leg cramps   Patient Active Problem List   Diagnosis Date Noted  . Dizziness 11/09/2013  . S/P excision of acoustic neuroma 11/09/2013  . Hyperlipidemia 11/09/2013  . Swelling of right ankle joint 06/28/2013  . Sprain of right knee 06/28/2013  . Sciatica of left side 05/21/2013  . Hip pain, bilateral 05/27/2012  . Other screening mammogram 05/27/2012  . ANKLE PAIN, RIGHT 11/16/2007  . FRACTURE, TIBIA 11/16/2007  . HIP PAIN 04/29/2007  . OSTEOPOROSIS NOS 04/29/2007  . HYPERCHOLESTEROLEMIA 04/28/2007  . DEPRESSION 04/28/2007  . DECREASED HEARING, LEFT EAR 04/28/2007  . HYPERTENSION 04/28/2007  . MYOCARDIAL INFARCTION, HX OF 04/28/2007  . MITRAL VALVE PROLAPSE 04/28/2007   Past Medical History  Diagnosis Date  . Anemia   . Arthritis   . Chicken pox   . Arterial disease   . White coat hypertension   . High cholesterol   . Heart attack 2006   Past Surgical History  Procedure Laterality Date  . Cholecystectomy  1990  . Tonsillectomy and adenoidectomy   1946  . Acoustic neuroma   1991  . Ankle surgery  2009   History  Substance Use Topics  . Smoking status: Former Games developer  . Smokeless tobacco: Not on file  . Alcohol Use: Yes     Comment: occ   No family history on file. Allergies  Allergen Reactions  . Fosamax [Alendronate Sodium]     Dental problems  . Statins     Muscle pain    Current Outpatient Prescriptions on File Prior to Visit  Medication Sig Dispense Refill  . aspirin 81 MG tablet Take 81 mg by mouth daily.      . Calcium Carbonate-Vitamin D (CALCIUM-VITAMIN D) 500-200 MG-UNIT per tablet Take 1 tablet by mouth daily.       . carvedilol (COREG) 6.25 MG tablet Take 6.25 mg by mouth 2 (two) times daily with a meal.      . HYDROcodone-acetaminophen (NORCO/VICODIN) 5-325 MG per tablet Take 1 tablet by mouth at bedtime as needed for pain.  30 tablet  0  . naproxen sodium (ANAPROX) 220 MG tablet Take 220 mg by mouth as needed.       . valsartan (DIOVAN) 160 MG tablet Take 160 mg by mouth daily.       No current facility-administered medications on file prior to visit.  Review of Systems Review of Systems  Constitutional: Negative for fever, appetite change, fatigue and unexpected weight change.  Eyes: Negative for pain and visual disturbance.  Respiratory: Negative for cough and shortness of breath.   Cardiovascular: Negative for cp or palpitations    Gastrointestinal: Negative for nausea, diarrhea and constipation.  Genitourinary: Negative for urgency and frequency.  Skin: Negative for pallor or rash   Neurological: Negative for weakness, numbness and headaches. pos for dizziness , neg for blurred vision/ facial droop / speech problem  Hematological: Negative for adenopathy. Does not bruise/bleed easily.  Psychiatric/Behavioral: Negative for dysphoric mood. The patient is not nervous/anxious.         Objective:   Physical Exam  Constitutional: She appears well-developed and well-nourished. No distress.  HENT:    Head: Normocephalic and atraumatic.  Right Ear: External ear normal.  Left Ear: External ear normal.  Mouth/Throat: Oropharynx is clear and moist.  Nares are boggy and congested   No sinus or temporal tenderness   Eyes: Conjunctivae and EOM are normal. Pupils are equal, round, and reactive to light. Right eye exhibits no discharge. Left eye exhibits no discharge. No scleral icterus.  4-5 beats of horizontal nystagmus on R Baseline lid lag on R   Neck: Normal range of motion. Neck supple. No JVD present. Carotid bruit is not present. No thyromegaly present.  Cardiovascular: Normal rate and regular rhythm.   Pulmonary/Chest: Effort normal and breath sounds normal. No respiratory distress. She has no wheezes. She has no rales.  Abdominal: Soft. She exhibits no abdominal bruit.  Musculoskeletal: She exhibits no edema.  Lymphadenopathy:    She has no cervical adenopathy.  Neurological: She is alert. She has normal reflexes. No cranial nerve deficit. She exhibits normal muscle tone. Coordination normal.  Skin: Skin is warm and dry. No rash noted. No erythema. No pallor.  Psychiatric: She has a normal mood and affect.          Assessment & Plan:

## 2013-11-09 NOTE — Progress Notes (Signed)
Pre-visit discussion using our clinic review tool. No additional management support is needed unless otherwise documented below in the visit note.  

## 2013-11-10 ENCOUNTER — Telehealth: Payer: Self-pay | Admitting: Family Medicine

## 2013-11-10 NOTE — Telephone Encounter (Signed)
I will send to pharmacy on file - please call her or pharmacy and ask about dose because it is not in medicine history-thanks

## 2013-11-10 NOTE — Assessment & Plan Note (Signed)
With past hx of acoustic neuroma- fully removed/ tx with gamma knife  This may not be related however - she does have more vertiginous features/ tinnitus  Ref to ENT , pt also c/o of chronic sinusitis

## 2013-11-10 NOTE — Telephone Encounter (Signed)
Message copied by Judy Pimple on Wed Nov 10, 2013 10:36 PM ------      Message from: Shon Millet      Created: Wed Nov 10, 2013  3:09 PM       Pt advise of labs and agrees to go back on her vytorin, pt just needs new Rx sent to pharmacy on file (I wasn't sure of strength of Rx so I haven't sent Rx in), please advise ------

## 2013-11-10 NOTE — Assessment & Plan Note (Signed)
Re check lipid today without med  vytorin works but is too $$ She is intol of other med however  Prev px by cardiology Rev diet and exercise

## 2013-11-10 NOTE — Assessment & Plan Note (Signed)
Now having dizziness Doubt rel to recurrence of neuroma but she wants to check that out  Ref to ENT

## 2013-11-11 MED ORDER — EZETIMIBE-SIMVASTATIN 10-20 MG PO TABS: 1.0000 | ORAL_TABLET | Freq: Every day | ORAL | Status: DC

## 2013-11-11 NOTE — Telephone Encounter (Signed)
Called pharmacy and she was on vytorin 10-20 mg

## 2013-11-11 NOTE — Telephone Encounter (Signed)
vytorin 10-20 sent to pharmacy

## 2013-12-21 ENCOUNTER — Ambulatory Visit: Payer: Medicare Other | Attending: Otolaryngology | Admitting: Rehabilitative and Restorative Service Providers"

## 2013-12-21 DIAGNOSIS — R42 Dizziness and giddiness: Secondary | ICD-10-CM | POA: Insufficient documentation

## 2013-12-21 DIAGNOSIS — R269 Unspecified abnormalities of gait and mobility: Secondary | ICD-10-CM | POA: Insufficient documentation

## 2013-12-21 DIAGNOSIS — IMO0001 Reserved for inherently not codable concepts without codable children: Secondary | ICD-10-CM | POA: Insufficient documentation

## 2014-01-04 ENCOUNTER — Encounter: Payer: Self-pay | Admitting: General Surgery

## 2014-01-04 DIAGNOSIS — I429 Cardiomyopathy, unspecified: Secondary | ICD-10-CM

## 2014-01-07 ENCOUNTER — Ambulatory Visit: Payer: Medicare Other | Admitting: Cardiology

## 2014-01-10 ENCOUNTER — Ambulatory Visit: Payer: Medicare Other | Admitting: Rehabilitative and Restorative Service Providers"

## 2014-01-10 ENCOUNTER — Other Ambulatory Visit: Payer: Self-pay | Admitting: Cardiology

## 2014-01-12 ENCOUNTER — Ambulatory Visit: Payer: Medicare Other | Admitting: Cardiology

## 2014-01-12 ENCOUNTER — Encounter: Payer: Medicare Other | Admitting: Rehabilitative and Restorative Service Providers"

## 2014-01-17 ENCOUNTER — Ambulatory Visit: Payer: Medicare Other | Attending: Otolaryngology | Admitting: Rehabilitative and Restorative Service Providers"

## 2014-01-17 DIAGNOSIS — R269 Unspecified abnormalities of gait and mobility: Secondary | ICD-10-CM | POA: Insufficient documentation

## 2014-01-17 DIAGNOSIS — IMO0001 Reserved for inherently not codable concepts without codable children: Secondary | ICD-10-CM | POA: Insufficient documentation

## 2014-01-17 DIAGNOSIS — R42 Dizziness and giddiness: Secondary | ICD-10-CM | POA: Insufficient documentation

## 2014-01-19 ENCOUNTER — Ambulatory Visit: Payer: Medicare Other | Admitting: Rehabilitative and Restorative Service Providers"

## 2014-01-24 ENCOUNTER — Ambulatory Visit: Payer: Medicare Other | Attending: Otolaryngology | Admitting: Rehabilitative and Restorative Service Providers"

## 2014-01-24 DIAGNOSIS — R269 Unspecified abnormalities of gait and mobility: Secondary | ICD-10-CM | POA: Insufficient documentation

## 2014-01-24 DIAGNOSIS — IMO0001 Reserved for inherently not codable concepts without codable children: Secondary | ICD-10-CM | POA: Insufficient documentation

## 2014-01-24 DIAGNOSIS — R42 Dizziness and giddiness: Secondary | ICD-10-CM | POA: Insufficient documentation

## 2014-01-26 ENCOUNTER — Ambulatory Visit: Payer: Medicare Other | Admitting: Rehabilitative and Restorative Service Providers"

## 2014-01-31 ENCOUNTER — Ambulatory Visit: Payer: Medicare Other | Admitting: Rehabilitative and Restorative Service Providers"

## 2014-02-07 ENCOUNTER — Ambulatory Visit: Payer: Medicare Other | Admitting: Rehabilitative and Restorative Service Providers"

## 2014-02-09 ENCOUNTER — Ambulatory Visit: Payer: Medicare Other | Admitting: Cardiology

## 2014-02-14 ENCOUNTER — Other Ambulatory Visit: Payer: Self-pay | Admitting: *Deleted

## 2014-02-14 ENCOUNTER — Ambulatory Visit: Payer: Medicare Other | Admitting: Rehabilitative and Restorative Service Providers"

## 2014-02-14 MED ORDER — VALSARTAN 160 MG PO TABS: ORAL_TABLET | ORAL | Status: DC

## 2014-02-16 ENCOUNTER — Encounter: Payer: Medicare Other | Admitting: Rehabilitative and Restorative Service Providers"

## 2014-02-17 ENCOUNTER — Encounter: Payer: Self-pay | Admitting: Cardiology

## 2014-02-17 ENCOUNTER — Ambulatory Visit (INDEPENDENT_AMBULATORY_CARE_PROVIDER_SITE_OTHER): Payer: Medicare Other | Admitting: Cardiology

## 2014-02-17 VITALS — BP 156/70 | HR 84 | Ht 69.0 in | Wt 171.0 lb

## 2014-02-17 DIAGNOSIS — I255 Ischemic cardiomyopathy: Secondary | ICD-10-CM | POA: Insufficient documentation

## 2014-02-17 DIAGNOSIS — I351 Nonrheumatic aortic (valve) insufficiency: Secondary | ICD-10-CM | POA: Insufficient documentation

## 2014-02-17 DIAGNOSIS — I420 Dilated cardiomyopathy: Secondary | ICD-10-CM

## 2014-02-17 DIAGNOSIS — I252 Old myocardial infarction: Secondary | ICD-10-CM

## 2014-02-17 NOTE — Patient Instructions (Signed)
Your physician recommends that you continue on your current medications as directed. Please refer to the Current Medication list given to you today.  Your physician has requested that you regularly monitor and record your blood pressure readings at home. Please use the same machine at the same time of day to check your readings and record them for one week and call us with the results.   Your physician wants you to follow-up in: 6 months with Dr Turner You will receive a reminder letter in the mail two months in advance. If you don't receive a letter, please call our office to schedule the follow-up appointment.  

## 2014-02-17 NOTE — Progress Notes (Addendum)
[address] [place], [place]  [address] Phone: [phone] Fax:  [phone]  Date:  02/17/2014   ID:  Latoya Parrish, DOB 14-Aug-1941, MRN [id number]  PCP:  Roxy Manns, MD  Cardiologist:  Armanda Magic, MD     History of Present Illness: Latoya Parrish is a 73 y.o. female with a history of NSTEMI secondary to coronary artery vasospasm with normal coronary arteries at cath, HTN and dyslipidemia who presents today for followup.  She is doing well. She denies any chest pain,  dizziness, palpitations or syncope. She has chronic DOE when going up the stairs but that is stable.  She occasionally has some mild LE edema at the end of the day.   Wt Readings from Last 3 Encounters:  02/17/14 171 lb (77.565 kg)  11/09/13 172 lb (78.019 kg)  06/28/13 171 lb 8 oz (77.792 kg)     Past Medical History  Diagnosis Date  . Anemia   . Arthritis   . Chicken pox   . Arterial disease   . Heart attack 2006    nontransmural MI seconday to vasispasm w normal coronary arteries  . Old non-ST elevation myocardial infarction (NSTEMI)   . Ischemic dilated cardiomyopathy     resolved  . White coat hypertension   . High cholesterol   . Aortic insufficiency     mild to moderate by echo 12/2012 with mild pulmonary HTN PASP 35-24mmHg, grade II diasotlic dysfunction    Current Outpatient Prescriptions  Medication Sig Dispense Refill  . aspirin 81 MG tablet Take 81 mg by mouth daily.      . Calcium Carbonate-Vitamin D (CALCIUM-VITAMIN D) 500-200 MG-UNIT per tablet Take 1 tablet by mouth daily.       . carvedilol (COREG) 6.25 MG tablet Take 6.25 mg by mouth 2 (two) times daily with a meal.      . ezetimibe-simvastatin (VYTORIN) 10-20 MG per tablet Take 1 tablet by mouth daily.  30 tablet  11  . HYDROcodone-acetaminophen (NORCO/VICODIN) 5-325 MG per tablet Take 1 tablet by mouth at bedtime as needed for pain.  30 tablet  0  . naproxen sodium (ANAPROX) 220 MG tablet Take 220 mg by mouth as needed.        . valsartan (DIOVAN) 160 MG tablet TAKE 1 TABLET BY MOUTH DAILY.  90 tablet  0  . zoster vaccine live, PF, (ZOSTAVAX) 69629 UNT/0.65ML injection Inject 19,400 Units into the skin once.  1 vial  0   No current facility-administered medications for this visit.    Allergies:    Allergies  Allergen Reactions  . Ace Inhibitors Cough  . Fosamax [Alendronate Sodium]     Dental problems  . Statins     Muscle pain     Social History:  The patient  reports that she has never smoked. She does not have any smokeless tobacco history on file. She reports that she drinks alcohol. She reports that she does not use illicit drugs.   Family History:  The patient's family history includes COPD in her father; Hypertension in her father.   ROS:  Please see the history of present illness.      All other systems reviewed and negative.   PHYSICAL EXAM: VS:  BP 156/70  Pulse 84  Ht 5\' 9"  (1.753 m)  Wt 171 lb (77.565 kg)  BMI 25.24 kg/m2 Well nourished, well developed, in no acute distress HEENT: normal Neck: no JVD Cardiac:  normal S1, S2;  RRR; no murmur Lungs:  clear to auscultation bilaterally, no wheezing, rhonchi or rales Abd: soft, nontender, no hepatomegaly Ext: no edema Skin: warm and dry Neuro:  CNs 2-12 intact, no focal abnormalities noted  EKG:     NSR with no ST changes and normal intervals  ASSESSMENT AND PLAN:  1. Remote NSTEMI with normal coronary arteries at time of cath - secondary to coronary artery vasospasm - continue ASA/beta blocker 2. Ischemic DCM now resolved 3. HTN - borderline control  - continue ARB/beta blocker - I have asked her to check her BP daily for a week and call with the results 4. Dyslipidemia - LDL increased significantly in January due to her stopping her statin.  She is back on it now - continue statin - she has a repeat statin pending in another month - she has started exercising at [employer]  Followup with me in 6  months Signed, Armanda Magic, MD 02/17/2014 11:10 AM

## 2014-02-21 ENCOUNTER — Encounter: Payer: Medicare Other | Admitting: Rehabilitative and Restorative Service Providers"

## 2014-02-28 ENCOUNTER — Encounter: Payer: Medicare Other | Admitting: Rehabilitative and Restorative Service Providers"

## 2014-05-12 ENCOUNTER — Encounter: Payer: Self-pay | Admitting: *Deleted

## 2014-05-23 ENCOUNTER — Other Ambulatory Visit: Payer: Self-pay | Admitting: Cardiology

## 2014-07-04 ENCOUNTER — Other Ambulatory Visit: Payer: Self-pay

## 2014-07-04 MED ORDER — CARVEDILOL 6.25 MG PO TABS: 6.2500 mg | ORAL_TABLET | Freq: Two times a day (BID) | ORAL | Status: DC

## 2014-07-04 MED ORDER — VALSARTAN 160 MG PO TABS: 160.0000 mg | ORAL_TABLET | Freq: Every day | ORAL | Status: DC

## 2014-08-24 ENCOUNTER — Other Ambulatory Visit: Payer: Self-pay | Admitting: Cardiology

## 2014-09-17 ENCOUNTER — Telehealth: Payer: Self-pay

## 2014-09-17 NOTE — Telephone Encounter (Signed)
LVM for pt to call back.    RE: scheduling AWV for 2015 with NP or PA if pt allows.  

## 2014-09-20 ENCOUNTER — Telehealth (INDEPENDENT_AMBULATORY_CARE_PROVIDER_SITE_OTHER): Payer: Medicare Other | Admitting: Family Medicine

## 2014-09-20 DIAGNOSIS — I10 Essential (primary) hypertension: Secondary | ICD-10-CM

## 2014-09-20 DIAGNOSIS — E7800 Pure hypercholesterolemia, unspecified: Secondary | ICD-10-CM

## 2014-09-20 NOTE — Telephone Encounter (Signed)
Message copied by Judy PimpleWER, MARNE A on Tue Sep 20, 2014  5:33 PM ------      Message from: Alvina ChouWALSH, TERRI J      Created: Mon Sep 19, 2014  4:18 PM      Regarding: Lab orders for Wednesday, 10.28.15       Lab orders, no f/u appt ------

## 2014-09-21 ENCOUNTER — Other Ambulatory Visit (INDEPENDENT_AMBULATORY_CARE_PROVIDER_SITE_OTHER): Payer: Medicare Other

## 2014-09-21 ENCOUNTER — Ambulatory Visit (INDEPENDENT_AMBULATORY_CARE_PROVIDER_SITE_OTHER): Payer: Medicare Other

## 2014-09-21 DIAGNOSIS — E780 Pure hypercholesterolemia: Secondary | ICD-10-CM

## 2014-09-21 DIAGNOSIS — Z23 Encounter for immunization: Secondary | ICD-10-CM

## 2014-09-21 LAB — COMPREHENSIVE METABOLIC PANEL
ALBUMIN: 3.8 g/dL (ref 3.5–5.2)
ALK PHOS: 69 U/L (ref 39–117)
ALT: 14 U/L (ref 0–35)
AST: 19 U/L (ref 0–37)
BUN: 14 mg/dL (ref 6–23)
CALCIUM: 8.9 mg/dL (ref 8.4–10.5)
CHLORIDE: 106 meq/L (ref 96–112)
CO2: 27 meq/L (ref 19–32)
Creatinine, Ser: 0.9 mg/dL (ref 0.4–1.2)
GFR: 62.53 mL/min (ref >60.00)
GLUCOSE: 92 mg/dL (ref 70–99)
Potassium: 3.9 mEq/L (ref 3.5–5.1)
Sodium: 140 mEq/L (ref 135–145)
TOTAL PROTEIN: 7.2 g/dL (ref 6.0–8.3)
Total Bilirubin: 0.5 mg/dL (ref 0.2–1.2)

## 2014-09-21 LAB — LIPID PANEL
CHOLESTEROL: 236 mg/dL — AB (ref 0–200)
HDL: 49.6 mg/dL (ref >39.00)
LDL Cholesterol: 148 mg/dL — ABNORMAL HIGH (ref 0–99)
NonHDL: 186.4
Total CHOL/HDL Ratio: 5
Triglycerides: 192 mg/dL — ABNORMAL HIGH (ref 0.0–149.0)
VLDL: 38.4 mg/dL (ref 0.0–40.0)

## 2014-09-21 LAB — CBC WITH DIFFERENTIAL/PLATELET
BASOS PCT: 0.5 % (ref 0.0–3.0)
Basophils Absolute: 0 10*3/uL (ref 0.0–0.1)
Eosinophils Absolute: 0.1 10*3/uL (ref 0.0–0.7)
Eosinophils Relative: 1.5 % (ref 0.0–5.0)
HCT: 36.5 % (ref 36.0–46.0)
Hemoglobin: 11.9 g/dL — ABNORMAL LOW (ref 12.0–15.0)
LYMPHS PCT: 32.4 % (ref 12.0–46.0)
Lymphs Abs: 2.2 10*3/uL (ref 0.7–4.0)
MCHC: 32.6 g/dL (ref 30.0–36.0)
MCV: 84.8 fl (ref 78.0–100.0)
MONO ABS: 0.6 10*3/uL (ref 0.1–1.0)
MONOS PCT: 9.5 % (ref 3.0–12.0)
Neutro Abs: 3.8 10*3/uL (ref 1.4–7.7)
Neutrophils Relative %: 56.1 % (ref 43.0–77.0)
PLATELETS: 192 10*3/uL (ref 150.0–400.0)
RBC: 4.31 Mil/uL (ref 3.87–5.11)
RDW: 13.9 % (ref 11.5–15.5)
WBC: 6.8 10*3/uL (ref 4.0–10.5)

## 2014-09-21 LAB — TSH: TSH: 1.35 u[IU]/mL (ref 0.35–4.50)

## 2014-09-22 ENCOUNTER — Encounter: Payer: Self-pay | Admitting: *Deleted

## 2014-09-22 DIAGNOSIS — Z23 Encounter for immunization: Secondary | ICD-10-CM

## 2014-09-26 ENCOUNTER — Ambulatory Visit: Payer: Medicare Other | Admitting: Cardiology

## 2014-09-27 ENCOUNTER — Encounter: Payer: Self-pay | Admitting: Cardiology

## 2014-09-27 ENCOUNTER — Ambulatory Visit (INDEPENDENT_AMBULATORY_CARE_PROVIDER_SITE_OTHER): Payer: Medicare Other | Admitting: Cardiology

## 2014-09-27 VITALS — BP 150/82 | HR 80 | Ht 69.0 in | Wt 170.6 lb

## 2014-09-27 DIAGNOSIS — I351 Nonrheumatic aortic (valve) insufficiency: Secondary | ICD-10-CM

## 2014-09-27 DIAGNOSIS — I10 Essential (primary) hypertension: Secondary | ICD-10-CM

## 2014-09-27 DIAGNOSIS — E7800 Pure hypercholesterolemia, unspecified: Secondary | ICD-10-CM

## 2014-09-27 DIAGNOSIS — I420 Dilated cardiomyopathy: Principal | ICD-10-CM

## 2014-09-27 DIAGNOSIS — I252 Old myocardial infarction: Secondary | ICD-10-CM

## 2014-09-27 DIAGNOSIS — I255 Ischemic cardiomyopathy: Secondary | ICD-10-CM

## 2014-09-27 MED ORDER — SIMVASTATIN 20 MG PO TABS: 20.0000 mg | ORAL_TABLET | Freq: Every day | ORAL | Status: DC

## 2014-09-27 NOTE — Patient Instructions (Signed)
Your physician wants you to follow-up in: 6 months with Dr. Mayford Knifeurner. You will receive a reminder letter in the mail two months in advance. If you don't receive a letter, please call our office to schedule the follow-up appointment.  Your physician has recommended you make the following change in your medication:  1) START Simvastatin 20 mg daily.   Your physician has requested that you have an echocardiogram. Echocardiography is a painless test that uses sound waves to create images of your heart. It provides your doctor with information about the size and shape of your heart and how well your heart's chambers and valves are working. This procedure takes approximately one hour. There are no restrictions for this procedure.  Your physician recommends that you have lab work in: 6 weeks (FASTING lipid, liver, BMET)

## 2014-09-27 NOTE — Progress Notes (Signed)
[address] [place], [place]  [address] Phone: [phone] Fax:  [phone]  Date:  09/27/2014   ID:  Latoya Parrish, DOB 1941/08/18, MRN [id number]  PCP:  Roxy Manns, MD  Cardiologist:  Armanda Magic, MD    History of Present Illness: Latoya Parrish is a 73 y.o. female with a history of NSTEMI secondary to coronary artery vasospasm with normal coronary arteries at cath, HTN and dyslipidemia who presents today for followup. She is doing well. She denies any chest pain, dizziness, palpitations or syncope. She has chronic DOE when going up the stairs but that is stable. She occasionally has some mild LE edema at the end of the day.   Wt Readings from Last 3 Encounters:  09/27/14 170 lb 9.6 oz (77.384 kg)  02/17/14 171 lb (77.565 kg)  11/09/13 172 lb (78.019 kg)     Past Medical History  Diagnosis Date  . Anemia   . Arthritis   . Chicken pox   . Arterial disease   . Heart attack 2006    nontransmural MI seconday to vasispasm w normal coronary arteries  . Old non-ST elevation myocardial infarction (NSTEMI)   . Ischemic dilated cardiomyopathy     resolved  . White coat hypertension   . High cholesterol   . Aortic insufficiency     mild to moderate by echo 12/2012 with mild pulmonary HTN PASP 35-21mmHg, grade II diasotlic dysfunction    Current Outpatient Prescriptions  Medication Sig Dispense Refill  . aspirin 81 MG tablet Take 81 mg by mouth daily.    . Calcium Carbonate-Vitamin D (CALCIUM-VITAMIN D) 500-200 MG-UNIT per tablet Take 1 tablet by mouth daily.     . carvedilol (COREG) 6.25 MG tablet Take 1 tablet (6.25 mg total) by mouth 2 (two) times daily with a meal. 60 tablet 1  . HYDROcodone-acetaminophen (NORCO/VICODIN) 5-325 MG per tablet Take 1 tablet by mouth at bedtime as needed for pain. 30 tablet 0  . naproxen sodium (ANAPROX) 220 MG tablet Take 220 mg by mouth as needed.     . valsartan (DIOVAN) 160 MG tablet Take 1 tablet (160 mg total) by mouth  daily. 30 tablet 1  . ezetimibe-simvastatin (VYTORIN) 10-20 MG per tablet Take 1 tablet by mouth daily. 30 tablet 11  . valsartan (DIOVAN) 160 MG tablet TAKE 1 TABLET BY MOUTH DAILY. 90 tablet 0  . zoster vaccine live, PF, (ZOSTAVAX) 29518 UNT/0.65ML injection Inject 19,400 Units into the skin once. 1 vial 0   No current facility-administered medications for this visit.    Allergies:    Allergies  Allergen Reactions  . Ace Inhibitors Cough  . Fosamax [Alendronate Sodium]     Dental problems  . Statins     Muscle pain     Social History:  The patient  reports that she has never smoked. She does not have any smokeless tobacco history on file. She reports that she drinks alcohol. She reports that she does not use illicit drugs.   Family History:  The patient's family history includes COPD in her father; Hypertension in her father and sister.   ROS:  Please see the history of present illness.      All other systems reviewed and negative.   PHYSICAL EXAM: VS:  BP 150/82 mmHg  Ht 5\' 9"  (1.753 m)  Wt 170 lb 9.6 oz (77.384 kg)  BMI 25.18 kg/m2 Well nourished, well developed, in no acute distress HEENT: normal Neck: no JVD  Cardiac:  normal S1, S2; RRR; 2/6 SM at RUSB  Lungs:  clear to auscultation bilaterally, no wheezing, rhonchi or rales Abd: soft, nontender, no hepatomegaly Ext: no edema Skin: warm and dry Neuro:  CNs 2-12 intact, no focal abnormalities noted    ASSESSMENT AND PLAN:  1. Remote NSTEMI with normal coronary arteries at time of cath - secondary to coronary artery vasospasm - continue ASA/beta blocker 2. Ischemic DCM now resolved 3. HTN - she has documented white coat syndrome.  BP at home range from 110-138/64-23mmHg - continue ARB/beta blocker - check BMET   4.   Dyslipidemia - LDL increased significantly in January due to her stopping her statin. She has not been taking it due to cost.  She had been on crestor but that was stopped due to leg cramps.  I am  going to start her back on just simvastatin for cost. We will start at 20mg  daily and recheck FLP and ALT in 6 weeks - she has started exercising at [employer]   5.  Mild to moderate AR by echo 2014 - will recheck to assess with echo  Followup with me in 6 months  Signed, Armanda Magic, MD [facility] 09/27/2014 2:43 PM

## 2014-09-28 ENCOUNTER — Other Ambulatory Visit: Payer: Self-pay | Admitting: Cardiology

## 2014-10-10 ENCOUNTER — Ambulatory Visit (HOSPITAL_COMMUNITY): Payer: Medicare Other | Attending: Family Medicine

## 2014-10-10 DIAGNOSIS — I359 Nonrheumatic aortic valve disorder, unspecified: Secondary | ICD-10-CM | POA: Diagnosis not present

## 2014-10-10 DIAGNOSIS — I10 Essential (primary) hypertension: Secondary | ICD-10-CM | POA: Diagnosis not present

## 2014-10-10 DIAGNOSIS — E785 Hyperlipidemia, unspecified: Secondary | ICD-10-CM | POA: Diagnosis not present

## 2014-10-10 DIAGNOSIS — I351 Nonrheumatic aortic (valve) insufficiency: Secondary | ICD-10-CM

## 2014-10-10 DIAGNOSIS — I420 Dilated cardiomyopathy: Secondary | ICD-10-CM

## 2014-10-10 DIAGNOSIS — R42 Dizziness and giddiness: Secondary | ICD-10-CM | POA: Insufficient documentation

## 2014-10-10 DIAGNOSIS — I255 Ischemic cardiomyopathy: Secondary | ICD-10-CM

## 2014-10-10 NOTE — Progress Notes (Signed)
2D Echo completed. 10/10/2014 

## 2014-11-07 ENCOUNTER — Telehealth: Payer: Self-pay | Admitting: Family Medicine

## 2014-11-07 DIAGNOSIS — I10 Essential (primary) hypertension: Secondary | ICD-10-CM

## 2014-11-07 DIAGNOSIS — E7800 Pure hypercholesterolemia, unspecified: Secondary | ICD-10-CM

## 2014-11-07 NOTE — Telephone Encounter (Signed)
-----   Message from Alvina Chouerri J Walsh sent at 11/03/2014 10:45 AM EST ----- Regarding: Lab orders for Wednesday, 12.16.15 Lab orders, no f/u appt

## 2014-11-09 ENCOUNTER — Other Ambulatory Visit: Payer: Medicare Other

## 2014-12-28 ENCOUNTER — Other Ambulatory Visit: Payer: Self-pay | Admitting: *Deleted

## 2014-12-28 ENCOUNTER — Other Ambulatory Visit: Payer: Self-pay | Admitting: Cardiology

## 2014-12-28 MED ORDER — VALSARTAN 160 MG PO TABS: 160.0000 mg | ORAL_TABLET | Freq: Every day | ORAL | Status: DC

## 2015-03-29 DIAGNOSIS — H16212 Exposure keratoconjunctivitis, left eye: Secondary | ICD-10-CM | POA: Diagnosis not present

## 2015-04-05 DIAGNOSIS — H16212 Exposure keratoconjunctivitis, left eye: Secondary | ICD-10-CM | POA: Diagnosis not present

## 2015-05-10 ENCOUNTER — Other Ambulatory Visit: Payer: Self-pay | Admitting: Cardiology

## 2015-05-22 ENCOUNTER — Encounter: Payer: Self-pay | Admitting: Cardiology

## 2015-05-22 DIAGNOSIS — I272 Pulmonary hypertension, unspecified: Secondary | ICD-10-CM | POA: Insufficient documentation

## 2015-05-22 DIAGNOSIS — I252 Old myocardial infarction: Secondary | ICD-10-CM | POA: Insufficient documentation

## 2015-05-22 NOTE — Progress Notes (Signed)
Cardiology Office Note   Date:  05/23/2015   ID:  Latoya MagicMary L Sliva, DOB 01/03/1941, MRN [id number]  PCP:  Roxy MannsMarne Tower, MD    Chief Complaint  Patient presents with  . Follow-up    ischemic dilated cardiomyopathy      History of Present Illness: Latoya Parrish is a 74 y.o. female with a history of NSTEMI secondary to coronary artery vasospasm with normal coronary arteries at cath, HTN and dyslipidemia who presents today for followup. She is doing well. She denies any chest pain, dizziness, palpitations or syncope. She has chronic DOE when going out in the heat but that is stable. She occasionally has some mild LE edema at the end of the day.    Past Medical History  Diagnosis Date  . Anemia   . Arthritis   . Chicken pox   . Arterial disease   . Heart attack 2006    nontransmural MI seconday to vasispasm w normal coronary arteries  . Old non-ST elevation myocardial infarction (NSTEMI)   . Ischemic dilated cardiomyopathy     resolved  . White coat hypertension   . High cholesterol   . Aortic insufficiency     mild to moderate by echo 12/2012 with mild pulmonary HTN PASP 35-8140mmHg, grade II diasotlic dysfunction  . MI, old 05/22/2015  . Pulmonary HTN 05/22/2015    Mild with PASP 38mmHg echo 09/2014    Past Surgical History  Procedure Laterality Date  . Cholecystectomy  1990  . Tonsillectomy and adenoidectomy  1946  . Acoustic neuroma   1991  . Ankle surgery  2009  . Facial plastic surgery  1996     Current Outpatient Prescriptions  Medication Sig Dispense Refill  . aspirin 81 MG tablet Take 81 mg by mouth daily.    . Calcium Carbonate-Vitamin D (CALCIUM-VITAMIN D) 500-200 MG-UNIT per tablet Take 1 tablet by mouth daily.     . carvedilol (COREG) 6.25 MG tablet TAKE 1 TABLET BY MOUTH 2 TIMES DAILY WITH A MEAL. 60 tablet 0  . ezetimibe-simvastatin (VYTORIN) 10-20 MG per tablet Take 1 tablet by mouth daily. 30 tablet 11  . HYDROcodone-acetaminophen  (NORCO/VICODIN) 5-325 MG per tablet Take 1 tablet by mouth at bedtime as needed for pain. 30 tablet 0  . naproxen sodium (ANAPROX) 220 MG tablet Take 220 mg by mouth as needed.     . simvastatin (ZOCOR) 20 MG tablet Take 1 tablet (20 mg total) by mouth at bedtime. 90 tablet 3  . valsartan (DIOVAN) 160 MG tablet Take 1 tablet (160 mg total) by mouth daily. 90 tablet 1  . zoster vaccine live, PF, (ZOSTAVAX) [id number] UNT/0.65ML injection Inject 19,400 Units into the skin once. 1 vial 0   No current facility-administered medications for this visit.    Allergies:   Ace inhibitors; Fosamax; and Statins    Social History:  The patient  reports that she has never smoked. She does not have any smokeless tobacco history on file. She reports that she drinks alcohol. She reports that she does not use illicit drugs.   Family History:  The patient's family history includes COPD in her father; Hypertension in her father and sister.    ROS:  Please see the history of present illness.   Otherwise, review of systems are positive for balance problems.   All other systems are reviewed and negative.    PHYSICAL EXAM:  VS:  BP 120/76 mmHg  Pulse 73  Ht  (1.753 m)  Wt 168 lb 3.2 oz (76.295 kg)  BMI 24.83 kg/m2 , BMI Body mass index is 24.83 kg/(m^2). GEN: Well nourished, well developed, in no acute distress HEENT: normal Neck: no JVD, carotid bruits, or masses Cardiac: RRR; no murmurs, rubs, or gallops,no edema  Respiratory:  clear to auscultation bilaterally, normal work of breathing GI: soft, nontender, nondistended, + BS MS: no deformity or atrophy Skin: warm and dry, no rash Neuro:  Strength and sensation are intact Psych: euthymic mood, full affect   EKG:  EKG was ordered today showed NSR with septal infarct and nonspecific ST abnormality    Recent Labs: 09/21/2014: ALT 14; BUN 14; Creatinine, Ser 0.9; Hemoglobin 11.9*; Platelets 192.0; Potassium 3.9; Sodium 140; TSH 1.35    Lipid  Panel    Component Value Date/Time   CHOL 236* 09/21/2014 0845   TRIG 192.0* 09/21/2014 0845   HDL 49.60 09/21/2014 0845   CHOLHDL 5 09/21/2014 0845   VLDL 38.4 09/21/2014 0845   LDLCALC 148* 09/21/2014 0845   LDLDIRECT 187.9 11/09/2013 0835      Wt Readings from Last 3 Encounters:  05/23/15 168 lb 3.2 oz (76.295 kg)  09/27/14 170 lb 9.6 oz (77.384 kg)  02/17/14 171 lb (77.565 kg)    ASSESSMENT AND PLAN:  1. Remote NSTEMI with normal coronary arteries at time of cath - secondary to coronary artery vasospasm - continue ASA/beta blocker 2. Ischemic DCM now resolved 3. HTN - she has documented white coat syndrome. BP controlled today and she brought in her BP readings from home which are well controlled.   - continue ARB/beta blocker - check BMET  4. Dyslipidemia - LDL increased in October 2015 - she has started exercising at [employer] - continue Zocor  5. Mild AR with mild pulmonary HTN (PASP by echo 09/2014) - will recheck echo 09/2015    Current medicines are reviewed at length with the patient today.  The patient does not have concerns regarding medicines.  The following changes have been made:  no change  Labs/ tests ordered today: See above Assessment and Plan No orders of the defined types were placed in this encounter.     Disposition:   FU with me in 6 months  Signed, Quintella Reichert, MD  05/23/2015 8:18 AM    [facility] [address] [place], [place], [place]  [address] Phone: [phone]; Fax: [phone]

## 2015-05-23 ENCOUNTER — Ambulatory Visit (INDEPENDENT_AMBULATORY_CARE_PROVIDER_SITE_OTHER): Payer: Medicare Other | Admitting: Cardiology

## 2015-05-23 ENCOUNTER — Encounter: Payer: Self-pay | Admitting: Cardiology

## 2015-05-23 VITALS — BP 120/76 | HR 73 | Ht 69.0 in | Wt 168.2 lb

## 2015-05-23 DIAGNOSIS — I252 Old myocardial infarction: Secondary | ICD-10-CM

## 2015-05-23 DIAGNOSIS — I272 Pulmonary hypertension, unspecified: Secondary | ICD-10-CM

## 2015-05-23 DIAGNOSIS — I351 Nonrheumatic aortic (valve) insufficiency: Secondary | ICD-10-CM

## 2015-05-23 DIAGNOSIS — I270 Primary pulmonary hypertension: Secondary | ICD-10-CM

## 2015-05-23 DIAGNOSIS — I420 Dilated cardiomyopathy: Principal | ICD-10-CM

## 2015-05-23 DIAGNOSIS — I10 Essential (primary) hypertension: Secondary | ICD-10-CM

## 2015-05-23 DIAGNOSIS — E7800 Pure hypercholesterolemia, unspecified: Secondary | ICD-10-CM

## 2015-05-23 DIAGNOSIS — I255 Ischemic cardiomyopathy: Secondary | ICD-10-CM

## 2015-05-23 NOTE — Patient Instructions (Addendum)
Medication Instructions:  Your physician recommends that you continue on your current medications as directed. Please refer to the Current Medication list given to you today.   Labwork: Friday, June 1st: FASTING labs (BMET, LFTs, Lipids). You may come ANY TIME between 7:30 AM and 5:00 PM.   Testing/Procedures: Your physician has requested that you have an echocardiogram in November, 2016. Echocardiography is a painless test that uses sound waves to create images of your heart. It provides your doctor with information about the size and shape of your heart and how well your heart's chambers and valves are working. This procedure takes approximately one hour. There are no restrictions for this procedure.  Follow-Up: Your physician wants you to follow-up in: 6 months with Dr. Mayford Knifeurner. You will receive a reminder letter in the mail two months in advance. If you don't receive a letter, please call our office to schedule the follow-up appointment.   Any Other Special Instructions Will Be Listed Below (If Applicable).

## 2015-05-26 ENCOUNTER — Other Ambulatory Visit: Payer: Self-pay

## 2015-06-06 ENCOUNTER — Encounter: Payer: Self-pay | Admitting: Family Medicine

## 2015-06-06 ENCOUNTER — Ambulatory Visit (INDEPENDENT_AMBULATORY_CARE_PROVIDER_SITE_OTHER): Payer: Medicare Other | Admitting: Family Medicine

## 2015-06-06 VITALS — BP 144/78 | HR 63 | Temp 97.7°F | Ht 69.0 in | Wt 166.8 lb

## 2015-06-06 DIAGNOSIS — R270 Ataxia, unspecified: Secondary | ICD-10-CM | POA: Diagnosis not present

## 2015-06-06 DIAGNOSIS — R2689 Other abnormalities of gait and mobility: Secondary | ICD-10-CM | POA: Insufficient documentation

## 2015-06-06 NOTE — Progress Notes (Signed)
Pre visit review using our clinic review tool, if applicable. No additional management support is needed unless otherwise documented below in the visit note. 

## 2015-06-06 NOTE — Patient Instructions (Signed)
Balance is off so use caution and the tricks and exercise that you have from PT  Use a walker when needed if you feel unsteady   Stop at check out for referral to neurology

## 2015-06-06 NOTE — Progress Notes (Signed)
Subjective:    Patient ID: Latoya MagicMary L Parrish, female    DOB: 20-Jul-1941, 74 y.o.   MRN: [id number]  HPI Here for balance issues  When she gets outside of the house- aware that she is off balance -this affects what she does  Is interested in seeing neurologist   Had acoustic neuroma removal in the past (gamma knife procedure)   She had PT in the past  Worked on balance with her - gave tricks and exercises to do    Has done great in general - helped her joint pain (that has gone away)   No tremor  A few toes in L foot stay numb (after broken ankle with a screw)  Perhaps a bit in other foot  Vision - is good overall - has baseline lid droop in R eye (her L eye does not fully close baseline)  Just had a good exam   Hearing is ok - had that checked in R ear -good  No hearing in L ear -baseline   Patient Active Problem List   Diagnosis Date Noted  . MI, old 05/22/2015  . Pulmonary HTN 05/22/2015  . Ischemic dilated cardiomyopathy   . Aortic insufficiency   . Dizziness 11/09/2013  . S/P excision of acoustic neuroma 11/09/2013  . Swelling of right ankle joint 06/28/2013  . Sprain of right knee 06/28/2013  . Sciatica of left side 05/21/2013  . Hip pain, bilateral 05/27/2012  . Other screening mammogram 05/27/2012  . ANKLE PAIN, RIGHT 11/16/2007  . FRACTURE, TIBIA 11/16/2007  . HIP PAIN 04/29/2007  . OSTEOPOROSIS NOS 04/29/2007  . HYPERCHOLESTEROLEMIA 04/28/2007  . DEPRESSION 04/28/2007  . DECREASED HEARING, LEFT EAR 04/28/2007  . Essential hypertension 04/28/2007  . MITRAL VALVE PROLAPSE 04/28/2007   Past Medical History  Diagnosis Date  . Anemia   . Arthritis   . Chicken pox   . Arterial disease   . Heart attack 2006    nontransmural MI seconday to vasispasm w normal coronary arteries  . Old non-ST elevation myocardial infarction (NSTEMI)   . Ischemic dilated cardiomyopathy     resolved  . White coat hypertension   . High cholesterol   . Aortic insufficiency    mild to moderate by echo 12/2012 with mild pulmonary HTN PASP 35-2440mmHg, grade II diasotlic dysfunction  . MI, old 05/22/2015  . Pulmonary HTN 05/22/2015    Mild with PASP 38mmHg echo 09/2014   Past Surgical History  Procedure Laterality Date  . Cholecystectomy  1990  . Tonsillectomy and adenoidectomy  1946  . Acoustic neuroma   1991  . Ankle surgery  2009  . Facial plastic surgery  1996   History  Substance Use Topics  . Smoking status: Never Smoker   . Smokeless tobacco: Not on file  . Alcohol Use: 0.0 oz/week    0 Standard drinks or equivalent per week     Comment: occ   Family History  Problem Relation Age of Onset  . Hypertension Father   . COPD Father   . Hypertension Sister    Allergies  Allergen Reactions  . Ace Inhibitors Cough  . Fosamax [Alendronate Sodium]     Dental problems  . Statins     Muscle pain    Current Outpatient Prescriptions on File Prior to Visit  Medication Sig Dispense Refill  . aspirin 81 MG tablet Take 81 mg by mouth daily.    . Calcium Carbonate-Vitamin D (CALCIUM-VITAMIN D) 500-200 MG-UNIT per tablet Take 1  tablet by mouth daily.     . carvedilol (COREG) 6.25 MG tablet TAKE 1 TABLET BY MOUTH 2 TIMES DAILY WITH A MEAL. 60 tablet 0  . ezetimibe-simvastatin (VYTORIN) 10-20 MG per tablet Take 1 tablet by mouth daily. 30 tablet 11  . naproxen sodium (ANAPROX) 220 MG tablet Take 220 mg by mouth as needed.     . simvastatin (ZOCOR) 20 MG tablet Take 1 tablet (20 mg total) by mouth at bedtime. 90 tablet 3  . valsartan (DIOVAN) 160 MG tablet Take 1 tablet (160 mg total) by mouth daily. 90 tablet 1  . zoster vaccine live, PF, (ZOSTAVAX) [address] UNT/0.65ML injection Inject 19,400 Units into the skin once. 1 vial 0   No current facility-administered medications on file prior to visit.      Review of Systems Review of Systems  Constitutional: Negative for fever, appetite change, fatigue and unexpected weight change.  Eyes: Negative for pain and  visual disturbance.  Respiratory: Negative for cough and shortness of breath.   Cardiovascular: Negative for cp or palpitations    Gastrointestinal: Negative for nausea, diarrhea and constipation.  Genitourinary: Negative for urgency and frequency.  Skin: Negative for pallor or rash   Neurological: Negative for weakness,  numbness and headaches. pos for poor balance and occ light headedness, neg for vertigo symptoms  Hematological: Negative for adenopathy. Does not bruise/bleed easily.  Psychiatric/Behavioral: Negative for dysphoric mood. The patient is not nervous/anxious.         Objective:   Physical Exam  Constitutional: She is oriented to person, place, and time. She appears well-developed and well-nourished. No distress.  HENT:  Head: Normocephalic and atraumatic.  Right Ear: External ear normal.  Left Ear: External ear normal.  Nose: Nose normal.  Mouth/Throat: Oropharynx is clear and moist. No oropharyngeal exudate.  Baseline facial assymetry on the L Unable to hear with R ear     Eyes: Conjunctivae and EOM are normal. Pupils are equal, round, and reactive to light. Right eye exhibits no discharge. Left eye exhibits no discharge. No scleral icterus.  Cannot fully close L eye (baseline) No nystagmus   Neck: Normal range of motion and full passive range of motion without pain. Neck supple. No JVD present. Carotid bruit is not present. No tracheal deviation present. No thyromegaly present.  Cardiovascular: Normal rate, regular rhythm and normal heart sounds.   No murmur heard. Pulmonary/Chest: Effort normal and breath sounds normal. No respiratory distress. She has no wheezes. She has no rales.  Abdominal: Soft. Bowel sounds are normal. She exhibits no distension and no mass. There is no tenderness.  Musculoskeletal: She exhibits no edema or tenderness.  Lymphadenopathy:    She has no cervical adenopathy.  Neurological: She is alert and oriented to person, place, and time.  She has normal strength and normal reflexes. She displays no atrophy and no tremor. No sensory deficit. She exhibits normal muscle tone. Coordination and gait abnormal.  Wide based gait  Trouble with heel/toe walking   Some backward lean on romberg exam   Baseline facial assymetry  No tremor   Skin: Skin is warm and dry. No rash noted. No pallor.  Psychiatric: She has a normal mood and affect. Her behavior is normal. Thought content normal.  Pleasant/talkative and mentally sharp          Assessment & Plan:   Problem List Items Addressed This Visit    Ataxia    Noted on exam today- wide based gait  Much trouble  with heel/toe walk Unsure if adding to balance problems or resulting from them Ref to neuro done       Relevant Orders   Ambulatory referral to Neurology   Poor balance - Primary    In elderly female with remote hx of acoustic neuroma in the past  Some ataxia on exam today  Does well prev falls since doing PT for gait training -but more fearful about going out lately  Recommend walker when needed Ref to neuro for eval       Relevant Orders   Ambulatory referral to Neurology

## 2015-06-06 NOTE — Assessment & Plan Note (Signed)
In elderly female with remote hx of acoustic neuroma in the past  Some ataxia on exam today  Does well prev falls since doing PT for gait training -but more fearful about going out lately  Recommend walker when needed Ref to neuro for eval

## 2015-06-07 NOTE — Assessment & Plan Note (Signed)
Noted on exam today- wide based gait  Much trouble with heel/toe walk Unsure if adding to balance problems or resulting from them Ref to neuro done

## 2015-06-08 ENCOUNTER — Other Ambulatory Visit: Payer: Self-pay | Admitting: Cardiology

## 2015-07-12 DIAGNOSIS — H10413 Chronic giant papillary conjunctivitis, bilateral: Secondary | ICD-10-CM | POA: Diagnosis not present

## 2015-07-12 DIAGNOSIS — H16212 Exposure keratoconjunctivitis, left eye: Secondary | ICD-10-CM | POA: Diagnosis not present

## 2015-07-24 ENCOUNTER — Encounter: Payer: Self-pay | Admitting: Neurology

## 2015-07-24 ENCOUNTER — Ambulatory Visit (INDEPENDENT_AMBULATORY_CARE_PROVIDER_SITE_OTHER): Payer: Medicare Other | Admitting: Neurology

## 2015-07-24 VITALS — BP 144/84 | HR 82 | Resp 20 | Ht 69.0 in | Wt 167.0 lb

## 2015-07-24 DIAGNOSIS — H933X2 Disorders of left acoustic nerve: Secondary | ICD-10-CM

## 2015-07-24 DIAGNOSIS — H933X9 Disorders of unspecified acoustic nerve: Secondary | ICD-10-CM | POA: Insufficient documentation

## 2015-07-24 NOTE — Patient Instructions (Signed)
Acoustic Neuroma An acoustic neuroma is an abnormal growth (tumor) of the pair of nerves that connects the ear to the brain (acoustic nerves), which allows you to hear. SYMPTOMS  Acoustic neuroma usually occurs on just one of the acoustic nerves and produces symptoms of:  Hearing loss.  Ringing in the affected ear.  Dizziness and loss of balance. DIAGNOSIS  It is usually diagnosed with magnetic resonance imaging (MRI). This is a special type of X-ray. TREATMENT   Surgery by a specialist can often remove the tumor while saving the function of the nerve. Often hearing can be preserved. However, sometimes hearing cannot be preserved.  The neurosurgeon will use an operating microscope.  The tumors can also be treated with radiation using a gamma knife. This is not a knife, but a machine that delivers a high dose of focused radiation. Little of the surrounding tissue is affected by the radiation because most of the radiation is directed with precision to the tumor. Because of the location of this tumor, there are complications that can come from this surgery. Some of these problems are:  Hearing loss.  Weakened or no facial muscle movement on the side that was operated (transient or permanent facial paralysis).  Leaking of the fluid that surrounds the brain and spinal cord (cerebrospinal fluid).  Inflammation caused by infection of the membranes surrounding your brain (meningitis).  Bleeding.  Stroke. Your surgeon will discuss why the surgery is necessary and also discuss the possible complications.  LET YOUR CAREGIVER KNOW ABOUT:  Allergies.  Medications taken.  Previous surgery.  Possibility of pregnancy.  Use of any steroids, including oral, skin creams, or drops. HOME CARE INSTRUCTIONS  A bandage (dressing), depending on the location of the cut from surgery (laceration), may have been applied. This may be changed once per day or as instructed. If the dressing sticks, it  may be soaked off with soapy water or hydrogen peroxide. Only take over-the-counter or prescription medicines for pain, discomfort, or fever as directed by your caregiver.  SEEK IMMEDIATE MEDICAL CARE IF:   You have redness, swelling, or increasing pain in the wound.  You notice pus coming from the wound.  You have a fever.  You notice a bad smell coming from the wound or dressing.  You have a breaking open of the wound (the edges do not stay together) after sutures have been removed. Document Released: 08/21/2005 Document Revised: 03/08/2013 Document Reviewed: 03/16/2014 [facility] Patient Information 2015 [place], [place]. This information is not intended to replace advice given to you by your health care provider. Make sure you discuss any questions you have with your health care provider.

## 2015-07-24 NOTE — Progress Notes (Signed)
Provider:  Melvyn Parrish, M D  Referring Provider: Judy Pimple, MD Primary Care Physician:  Latoya Manns, MD  Chief Complaint  Patient presents with  . Gait Problem    balance problems, rm 11, alone   Chief complaint according to patient : " My equilibrium is off"   HPI:  Latoya Parrish is a 74 y.o. female , seen here as a referral from Latoya Parrish for balance problems.  Latoya Parrish presents here as a new patient. She states that in 1991 she was diagnosed with a golf ball sized acoustic neuroma which had to be removed surgically. In 1996 she had a second procedure to remove some of the tumor tissue that could not have been removed first time. This was a gamma knife surgery. She remained with a weakness of the left face almost appearing like a Bell's palsy. She underwent a plastic surgery procedure to gain more symmetry to her facial features which worked fine for her. In the 1990s there was no vestibular rehabilitation available and the patient noted that she had balance problems ever since her first surgery.  But these seem to have recently gained a new quality and intensity.  She is physically in excellent shape, has good vision and fairly good hearing in the right ear,  she is completely deaf in the left ear at baseline. She does have a dilated cardiomyopathy pulmonary hypertension, and had an myocardial infarction in the past. She also has an aortic insufficiency murmur. The concern here is about worsening of her balance problems. The patient reports that if she is in the process of climbing down a flight of stairs she feels as if she would fall any time. She has to cling to the railing. There have been no falls over the last 6 months reported , but she does have the fear of falling.   Social history:  She drinks one coffee in AM, ETOH: one glass of wine at night and  She is a non smoker. Widowed since 2012 after 40 years of marriage.  Retired Comptroller, with 2 adult daughters.      Review of Systems: Out of a complete 14 system review, the patient complains of only the following symptoms, and all other reviewed systems are negative. Geriatric depression score was endorsed at one point, she stated hearing loss, occasional spinning sensation, tinnitus on the right.      Social History   Social History  . Marital Status: Married    Spouse Name: N/A  . Number of Children: N/A  . Years of Education: N/A   Occupational History  . teacher    Social History Main Topics  . Smoking status: Never Smoker   . Smokeless tobacco: Not on file  . Alcohol Use: 0.0 oz/week    0 Standard drinks or equivalent per week     Comment: occ  . Drug Use: No  . Sexual Activity: Not on file   Other Topics Concern  . Not on file   Social History Narrative   Widowed in 2012    Working part time    Family History  Problem Relation Age of Onset  . Hypertension Father   . COPD Father   . Hypertension Sister     Past Medical History  Diagnosis Date  . Anemia   . Arthritis   . Chicken pox   . Arterial disease   . Heart attack 2006    nontransmural MI seconday to vasispasm w  normal coronary arteries  . Old non-ST elevation myocardial infarction (NSTEMI)   . Ischemic dilated cardiomyopathy     resolved  . White coat hypertension   . High cholesterol   . Aortic insufficiency     mild to moderate by echo 12/2012 with mild pulmonary HTN PASP 35-80mmHg, grade II diasotlic dysfunction  . MI, old 05/22/2015  . Pulmonary HTN 05/22/2015    Mild with PASP echo 09/2014    Past Surgical History  Procedure Laterality Date  . Cholecystectomy  1990  . Tonsillectomy and adenoidectomy  1946  . Acoustic neuroma   1991  . Ankle surgery  2009  . Facial plastic surgery  1996    Current Outpatient Prescriptions  Medication Sig Dispense Refill  . aspirin 81 MG tablet Take 81 mg by mouth daily.    . Calcium Carbonate-Vitamin D (CALCIUM-VITAMIN D) 500-200 MG-UNIT per  tablet Take 1 tablet by mouth daily.     . carvedilol (COREG) 6.25 MG tablet TAKE 1 TABLET BY MOUTH 2 TIMES DAILY WITH A MEAL. 60 tablet 5  . ezetimibe-simvastatin (VYTORIN) 10-20 MG per tablet Take 1 tablet by mouth daily. 30 tablet 11  . naproxen sodium (ANAPROX) 220 MG tablet Take 220 mg by mouth as needed.     . simvastatin (ZOCOR) 20 MG tablet Take 1 tablet (20 mg total) by mouth at bedtime. 90 tablet 3  . valsartan (DIOVAN) 160 MG tablet Take 1 tablet (160 mg total) by mouth daily. 90 tablet 1  . zoster vaccine live, PF, (ZOSTAVAX) 09811 UNT/0.65ML injection Inject 19,400 Units into the skin once. 1 vial 0   No current facility-administered medications for this visit.    Allergies as of 07/24/2015 - Review Complete 07/24/2015  Allergen Reaction Noted  . Ace inhibitors Cough 02/17/2014  . Fosamax [alendronate sodium]  05/27/2012  . Statins  05/27/2012    Vitals: BP 144/84 mmHg  Pulse 82  Resp 20  Ht 5\' 9"  (1.753 m)  Wt 167 lb (75.751 kg)  BMI 24.65 kg/m2 Last Weight:  Wt Readings from Last 1 Encounters:  07/24/15 167 lb (75.751 kg)   BJY:NWGN mass index is 24.65 kg/(m^2).     Last Height:   Ht Readings from Last 1 Encounters:  07/24/15 5\' 9"  (1.753 m)    Physical exam:  General: The patient is awake, alert and appears not in acute distress. The patient is well groomed. Head: Normocephalic, atraumatic. Neck is supple. Mallampati 3,  neck circumference: 14.5  Nasal airflow unrestricted. Cardiovascular:  Regular rate and rhythm, with a ejection/ regurgitation  murmur or carotid bruit, and without distended neck veins. Respiratory: Lungs are clear to auscultation. Skin:  Without evidence of edema, or rash Trunk: The patient's posture is erect .  Neurologic exam : The patient is awake and alert, oriented to place and time.   Memory subjective  described as intact.    Attention span & concentration ability appears normal.  Speech is fluent, without dysarthria,  dysphonia or aphasia.  Mood and affect are appropriate.  Cranial nerves: Pupils are equal and briskly reactive to light. Funduscopic exam without evidence of pallor or edema.  Extraocular movements in vertical and horizontal planes intact and without nystagmus. Visual fields by finger perimetry are intact. Hearing to finger rub intact on the right- completely deaf on the left .   Facial sensation to fine touch is lost in the left face. .  Facial motor strength is asymmetric, the left eye cannot be closed-  the left face is drawn.  Tongue and uvula move midline. Shoulder shrug was symmetrical.   Motor exam:   Normal tone, muscle bulk and symmetric strength in all extremities.  Sensory:  Fine touch, pinprick and vibration were tested in all extremities. Proprioception in the left  upper extremities was affected.  The left foot  does not feel fine touch.   Coordination: Rapid alternating movements in the fingers/hands was normal.  Finger-to-nose maneuver on the left with mild ataxia, dysmetria but no tremor.   Gait and station: Patient walks without assistive device and is able unassisted to climb up to the exam table. Strength within normal limits. Stance is stable and normal.  Toe and hell stand were tested, the patient has a left foot drop .  Tandem gait is fragmented. Turns with 4 Steps. Romberg testing is negative.  Deep tendon reflexes: in the  upper and lower extremities are symmetric and intact. Babinski maneuver response is  Downgoing. The patient reports not feeling the plantar stimulus on the left.  The patient was advised of the nature of the diagnosed balance  disorder , the treatment options and risks for general a health and wellness arising from not treating the condition.  I spent more than 40 minutes of face to face time with the patient. Greater than 50% of time was spent in counseling and coordination of care. We have discussed the diagnosis and differential and I answered  the patient's questions.   The patient brought her original MRIs from 1991 with her for me to review here,  in her presence. They document a large acoustic neuroma affecting the left brainstem.   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 The removal of the neuroma also removed this nerve. Hearing and balance have since then been affected. A worsening of the balance problems may have arisen due to aging process. She also has gained a hunched posture and is not quite indirect as she walks. And she has been placed on several antihypertensive medications , which  affect her ability to regain her balance when she initiates movement or changes posture.  2) she fractured her left foot several years ago but since then has a mild foot drop and an inability to completely dorsiflex at the ankle. There is also sensory loss in the left foot is numb or presents only with dull sensations but not to fine filament touch or vibration. This affects her balance further. It is a contributor but not a cause of her balance problem.  3) the patient underwent vestibular rehabilitation was Margretta Ditty at: [facility] rehabilitation. She concluded that treatment in May 2015. She gained a sense of better balance from the therapy , it helped her and I think that she should repeat the course.    Plan:  Treatment plan and additional workup :  Mrs. Dore's hearing and balance both are damaged from acoustic neuroma, which compressed the eighth cranial nerve completely. The surgical removal also eliminated any  rehabilitation possibility. There is no medication for her condition as she is well aware of. My goal is for her to repeat the conservative vestibular  rehabilitation with the main goal of fall prevention and gait stability improvement. Her balance difficulties are primarily of central origin.   Porfirio Mylar Shanan Fitzpatrick MD  07/24/2015   CC: Latoya Pimple, Md [address] [place], [place] [address]

## 2015-08-09 ENCOUNTER — Encounter: Payer: Self-pay | Admitting: Cardiology

## 2015-08-11 ENCOUNTER — Other Ambulatory Visit: Payer: Self-pay | Admitting: Cardiology

## 2015-09-11 ENCOUNTER — Ambulatory Visit (INDEPENDENT_AMBULATORY_CARE_PROVIDER_SITE_OTHER): Payer: Medicare Other

## 2015-09-11 DIAGNOSIS — Z23 Encounter for immunization: Secondary | ICD-10-CM

## 2015-09-28 ENCOUNTER — Other Ambulatory Visit: Payer: Self-pay | Admitting: Cardiology

## 2015-10-02 ENCOUNTER — Other Ambulatory Visit: Payer: Self-pay

## 2015-10-02 ENCOUNTER — Ambulatory Visit (HOSPITAL_COMMUNITY): Payer: Medicare Other | Attending: Cardiology

## 2015-10-02 DIAGNOSIS — I351 Nonrheumatic aortic (valve) insufficiency: Secondary | ICD-10-CM | POA: Diagnosis not present

## 2015-10-02 DIAGNOSIS — I340 Nonrheumatic mitral (valve) insufficiency: Secondary | ICD-10-CM | POA: Diagnosis not present

## 2015-10-02 DIAGNOSIS — I517 Cardiomegaly: Secondary | ICD-10-CM | POA: Insufficient documentation

## 2015-10-02 DIAGNOSIS — I272 Other secondary pulmonary hypertension: Secondary | ICD-10-CM | POA: Diagnosis not present

## 2015-10-02 DIAGNOSIS — I10 Essential (primary) hypertension: Secondary | ICD-10-CM | POA: Insufficient documentation

## 2015-10-02 DIAGNOSIS — Q248 Other specified congenital malformations of heart: Secondary | ICD-10-CM | POA: Diagnosis not present

## 2015-10-02 DIAGNOSIS — I255 Ischemic cardiomyopathy: Secondary | ICD-10-CM | POA: Diagnosis not present

## 2015-10-02 DIAGNOSIS — I420 Dilated cardiomyopathy: Secondary | ICD-10-CM

## 2015-11-14 ENCOUNTER — Other Ambulatory Visit: Payer: Self-pay | Admitting: Cardiology

## 2015-11-29 ENCOUNTER — Encounter: Payer: Self-pay | Admitting: Cardiology

## 2015-11-29 ENCOUNTER — Ambulatory Visit (INDEPENDENT_AMBULATORY_CARE_PROVIDER_SITE_OTHER): Payer: Medicare Other | Admitting: Cardiology

## 2015-11-29 VITALS — BP 162/70 | HR 68 | Ht 69.0 in | Wt 172.0 lb

## 2015-11-29 DIAGNOSIS — I252 Old myocardial infarction: Secondary | ICD-10-CM | POA: Diagnosis not present

## 2015-11-29 DIAGNOSIS — I059 Rheumatic mitral valve disease, unspecified: Secondary | ICD-10-CM

## 2015-11-29 DIAGNOSIS — I255 Ischemic cardiomyopathy: Secondary | ICD-10-CM

## 2015-11-29 DIAGNOSIS — I351 Nonrheumatic aortic (valve) insufficiency: Secondary | ICD-10-CM

## 2015-11-29 DIAGNOSIS — I10 Essential (primary) hypertension: Secondary | ICD-10-CM

## 2015-11-29 DIAGNOSIS — E7800 Pure hypercholesterolemia, unspecified: Secondary | ICD-10-CM

## 2015-11-29 DIAGNOSIS — I420 Dilated cardiomyopathy: Secondary | ICD-10-CM

## 2015-11-29 NOTE — Patient Instructions (Signed)
Medication Instructions:  Your physician recommends that you continue on your current medications as directed. Please refer to the Current Medication list given to you today.   Labwork: None  Testing/Procedures: None  Follow-Up: Your physician wants you to follow-up in: 1 year with Dr. Mayford Knifeurner. You will receive a reminder letter in the mail two months in advance. If you don't receive a letter, please call our office to schedule the follow-up appointment.   Any Other Special Instructions Will Be Listed Below (If Applicable). Please check your blood pressure daily for one week and call with results.    If you need a refill on your cardiac medications before your next appointment, please call your pharmacy.

## 2015-11-29 NOTE — Progress Notes (Signed)
0                Cardiology Office Note   Date:  11/29/2015   ID:  SHAANA ACOCELLA, DOB 10/13/1941, MRN [id number]  PCP:  Roxy Manns, MD    Chief Complaint  Patient presents with  . Hypertension  . Hyperlipidemia      History of Present Illness: Latoya Parrish is a 75 y.o. female with a history of NSTEMI secondary to coronary artery vasospasm with normal coronary arteries at cath, HTN and dyslipidemia who presents today for followup. She is doing well. She denies any chest pain or syncope. She has chronic DOE when going out in the heat but that is stable. She occasionally has some mild LE edema at the end of the day. She has occasional feeling of off balance related to prior acoustic neruoma and is followed by Neurology.      Past Medical History  Diagnosis Date  . Anemia   . Arthritis   . Chicken pox   . Arterial disease (HCC)   . Heart attack (HCC) 2006    nontransmural MI seconday to vasispasm w normal coronary arteries  . Old non-ST elevation myocardial infarction (NSTEMI)   . Ischemic dilated cardiomyopathy     resolved  . White coat hypertension   . High cholesterol   . Aortic insufficiency     mild to moderate by echo 12/2012 with mild pulmonary HTN PASP 35-65mmHg, grade II diasotlic dysfunction  . MI, old 05/22/2015  . Pulmonary HTN (HCC) 05/22/2015    Mild with PASP echo 09/2014    Past Surgical History  Procedure Laterality Date  . Cholecystectomy  1990  . Tonsillectomy and adenoidectomy  1946  . Acoustic neuroma   1991  . Ankle surgery  2009  . Facial plastic surgery  1996     Current Outpatient Prescriptions  Medication Sig Dispense Refill  . aspirin 81 MG tablet Take 81 mg by mouth daily.    . Calcium Carbonate-Vitamin D (CALCIUM-VITAMIN D) 500-200 MG-UNIT per tablet Take 1 tablet by mouth daily.     . carvedilol (COREG) 6.25 MG tablet TAKE 1 TABLET BY MOUTH 2 TIMES DAILY WITH A MEAL. 60 tablet 7  . ezetimibe-simvastatin (VYTORIN) 10-20 MG  per tablet Take 1 tablet by mouth daily. 30 tablet 11  . naproxen sodium (ANAPROX) 220 MG tablet Take 220 mg by mouth as needed.     . simvastatin (ZOCOR) 20 MG tablet Take 1 tablet (20 mg total) by mouth at bedtime. 90 tablet 3  . valsartan (DIOVAN) 160 MG tablet Take 1 tablet (160 mg total) by mouth daily. 90 tablet 1  . zoster vaccine live, PF, (ZOSTAVAX) 40981 UNT/0.65ML injection Inject 19,400 Units into the skin once. 1 vial 0   No current facility-administered medications for this visit.    Allergies:   Ace inhibitors; Fosamax; and Statins    Social History:  The patient  reports that she has never smoked. She does not have any smokeless tobacco history on file. She reports that she drinks alcohol. She reports that she does not use illicit drugs.   Family History:  The patient's family history includes COPD in her father; Hypertension in her father and sister.    ROS:  Please see the history of present illness.   Otherwise, review of systems are positive for none.   All other systems are reviewed and negative.    PHYSICAL EXAM: VS:  BP 162/70 mmHg  Pulse 68  Ht 5\' 9"  (1.753 m)  Wt 172 lb (78.019 kg)  BMI 25.39 kg/m2 , BMI Body mass index is 25.39 kg/(m^2). GEN: Well nourished, well developed, in no acute distress HEENT: normal Neck: no JVD, carotid bruits, or masses Cardiac: RRR; no murmurs, rubs, or gallops,no edema  Respiratory:  clear to auscultation bilaterally, normal work of breathing GI: soft, nontender, nondistended, + BS MS: no deformity or atrophy Skin: warm and dry, no rash Neuro:  Strength and sensation are intact Psych: euthymic mood, full affect   EKG:  EKG is not ordered today.    Recent Labs: No results found for requested labs within last 365 days.    Lipid Panel    Component Value Date/Time   CHOL 236* 09/21/2014 0845   TRIG 192.0* 09/21/2014 0845   HDL 49.60 09/21/2014 0845   CHOLHDL 5 09/21/2014 0845   VLDL 38.4 09/21/2014 0845    LDLCALC 148* 09/21/2014 0845   LDLDIRECT 187.9 11/09/2013 0835      Wt Readings from Last 3 Encounters:  11/29/15 172 lb (78.019 kg)  07/24/15 167 lb (75.751 kg)  06/06/15 166 lb 12 oz (75.637 kg)    ASSESSMENT AND PLAN:  1. Remote NSTEMI with normal coronary arteries at time of cath - secondary to coronary artery vasospasm - continue ASA/beta blocker 2. Ischemic DCM now resolved HTN - she has documented white coat syndrome. BP borderline controlled today .  I have asked her to check her BP daily for a week and call with the results. - continue ARB/beta blocker  4. Dyslipidemia - LDL increased in October 2015 - she has not been exercising lately.   - continue Vytorin - she is getting her lipids checked by PCP and Will get a copy of those.    5. Mild AR and mild MVP with no with  pulmonary HTN by echo 09/2015    Current medicines are reviewed at length with the patient today.  The patient does not have concerns regarding medicines.  The following changes have been made:  no change  Labs/ tests ordered today: See above Assessment and Plan No orders of the defined types were placed in this encounter.     Disposition:   FU with me in 1 year  Signed, Quintella ReichertURNER,TRACI R, MD  11/29/2015 9:10 AM    [facility] [address], [place], [place]  [address] Phone: [phone]; Fax: [phone]

## 2015-12-31 ENCOUNTER — Telehealth: Payer: Self-pay | Admitting: Family Medicine

## 2015-12-31 DIAGNOSIS — I10 Essential (primary) hypertension: Secondary | ICD-10-CM

## 2015-12-31 DIAGNOSIS — E7800 Pure hypercholesterolemia, unspecified: Secondary | ICD-10-CM

## 2015-12-31 NOTE — Telephone Encounter (Signed)
-----   Message from Baldomero Lamy sent at 12/28/2015  9:55 AM EST ----- Regarding: Cpx labs Thurs 2/9, need orders. Thanks! :-) Please order  future cpx labs for pt's upcoming lab appt. Thanks Rodney Booze

## 2016-01-04 ENCOUNTER — Other Ambulatory Visit (INDEPENDENT_AMBULATORY_CARE_PROVIDER_SITE_OTHER): Payer: Medicare Other

## 2016-01-04 DIAGNOSIS — I10 Essential (primary) hypertension: Secondary | ICD-10-CM | POA: Diagnosis not present

## 2016-01-04 DIAGNOSIS — E7800 Pure hypercholesterolemia, unspecified: Secondary | ICD-10-CM

## 2016-01-04 LAB — COMPREHENSIVE METABOLIC PANEL
ALK PHOS: 63 U/L (ref 39–117)
ALT: 17 U/L (ref 0–35)
AST: 19 U/L (ref 0–37)
Albumin: 4.4 g/dL (ref 3.5–5.2)
BILIRUBIN TOTAL: 0.6 mg/dL (ref 0.2–1.2)
BUN: 19 mg/dL (ref 6–23)
CALCIUM: 9.3 mg/dL (ref 8.4–10.5)
CO2: 28 mEq/L (ref 19–32)
CREATININE: 0.88 mg/dL (ref 0.40–1.20)
Chloride: 105 mEq/L (ref 96–112)
GFR: 66.42 mL/min (ref >60.00)
GLUCOSE: 98 mg/dL (ref 70–99)
Potassium: 4.5 mEq/L (ref 3.5–5.1)
Sodium: 138 mEq/L (ref 135–145)
Total Protein: 7.2 g/dL (ref 6.0–8.3)

## 2016-01-04 LAB — CBC WITH DIFFERENTIAL/PLATELET
BASOS ABS: 0 10*3/uL (ref 0.0–0.1)
Basophils Relative: 0.5 % (ref 0.0–3.0)
EOS ABS: 0.1 10*3/uL (ref 0.0–0.7)
EOS PCT: 1.4 % (ref 0.0–5.0)
HEMATOCRIT: 38.4 % (ref 36.0–46.0)
Hemoglobin: 12.5 g/dL (ref 12.0–15.0)
Lymphocytes Relative: 30.4 % (ref 12.0–46.0)
Lymphs Abs: 2.3 10*3/uL (ref 0.7–4.0)
MCHC: 32.6 g/dL (ref 30.0–36.0)
MCV: 84.6 fl (ref 78.0–100.0)
MONO ABS: 0.6 10*3/uL (ref 0.1–1.0)
MONOS PCT: 8.3 % (ref 3.0–12.0)
NEUTROS ABS: 4.5 10*3/uL (ref 1.4–7.7)
Neutrophils Relative %: 59.4 % (ref 43.0–77.0)
PLATELETS: 191 10*3/uL (ref 150.0–400.0)
RBC: 4.54 Mil/uL (ref 3.87–5.11)
RDW: 14.2 % (ref 11.5–15.5)
WBC: 7.5 10*3/uL (ref 4.0–10.5)

## 2016-01-04 LAB — LIPID PANEL
CHOLESTEROL: 210 mg/dL — AB (ref 0–200)
HDL: 52 mg/dL (ref >39.00)
LDL Cholesterol: 121 mg/dL — ABNORMAL HIGH (ref 0–99)
NonHDL: 157.7
TRIGLYCERIDES: 183 mg/dL — AB (ref 0.0–149.0)
Total CHOL/HDL Ratio: 4
VLDL: 36.6 mg/dL (ref 0.0–40.0)

## 2016-01-04 LAB — TSH: TSH: 1.03 u[IU]/mL (ref 0.35–4.50)

## 2016-01-12 ENCOUNTER — Encounter: Payer: Self-pay | Admitting: Family Medicine

## 2016-01-12 ENCOUNTER — Ambulatory Visit (INDEPENDENT_AMBULATORY_CARE_PROVIDER_SITE_OTHER): Payer: Medicare Other | Admitting: Family Medicine

## 2016-01-12 VITALS — BP 140/84 | HR 64 | Temp 98.0°F | Ht 67.5 in | Wt 169.8 lb

## 2016-01-12 DIAGNOSIS — Z23 Encounter for immunization: Secondary | ICD-10-CM

## 2016-01-12 DIAGNOSIS — E2839 Other primary ovarian failure: Secondary | ICD-10-CM

## 2016-01-12 DIAGNOSIS — Z0000 Encounter for general adult medical examination without abnormal findings: Secondary | ICD-10-CM | POA: Diagnosis not present

## 2016-01-12 DIAGNOSIS — E7800 Pure hypercholesterolemia, unspecified: Secondary | ICD-10-CM | POA: Diagnosis not present

## 2016-01-12 DIAGNOSIS — M810 Age-related osteoporosis without current pathological fracture: Secondary | ICD-10-CM | POA: Diagnosis not present

## 2016-01-12 DIAGNOSIS — I10 Essential (primary) hypertension: Secondary | ICD-10-CM

## 2016-01-12 DIAGNOSIS — Z1239 Encounter for other screening for malignant neoplasm of breast: Secondary | ICD-10-CM | POA: Insufficient documentation

## 2016-01-12 MED ORDER — SIMVASTATIN 20 MG PO TABS: 20.0000 mg | ORAL_TABLET | Freq: Every day | ORAL | Status: DC

## 2016-01-12 NOTE — Progress Notes (Signed)
Subjective:    Patient ID: Latoya Parrish, female    DOB: 08/18/1941, 75 y.o.   MRN: [id number]  HPI Here for annual medicare wellness visit as well as chronic/acute medical problems as well as annual preventative exam  I have personally reviewed the Medicare Annual Wellness questionnaire and have noted 1. The patient's medical and social history 2. Their use of alcohol, tobacco or illicit drugs 3. Their current medications and supplements 4. The patient's functional ability including ADL's, fall risks, home safety risks and hearing or visual             impairment. 5. Diet and physical activities 6. Evidence for depression or mood disorders  The patients weight, height, BMI have been recorded in the chart and visual acuity is per eye clinic.  I have made referrals, counseling and provided education to the patient based review of the above and I have provided the pt with a written personalized care plan for preventive services. Reviewed and updated provider list, see scanned forms.  See scanned forms.  Routine anticipatory guidance given to patient.  See health maintenance. Colon cancer screening - never had a colonoscopy , she just sent off for a kit through [employer] - for screen  Breast cancer screening-- mm has been quite a few years now since her last one  Would like to go - would like to start going to [facility]  Self breast exam- no changes or lumps  Flu vaccine 10/16 Tetanus vaccine 11/10  Pneumovax 8/13 , due for the prevnar  Zoster vaccine - could not afford it   dexa- 7/13 osteoporosis  No falls or fx in the past year  Last fx was ankle from a fall  She is taking ca and D for her bones Does exercise - and just started Yoga  She cannot take bisphosphenate  Took evista (tried it) - did not tolerate it - she does not know what side effects were-never told us at the time  She does have balance problems - and does follow fall precautions- very careful (due to her  acoustic neuroma removal) - has seen neuro and does PT for balance and also exercise   Advance directive-has a living will and poa  Cognitive function addressed- see scanned forms- and if abnormal then additional documentation follows. No concerns - very good!   PMH and SH reviewed  Meds, vitals, and allergies reviewed.   ROS: See HPI.  Otherwise negative.    Wt is down 3 lb with bmi of 26  bp is stable today (usually 140s/80s)- she follow with cardiology - has white coat HTN = usually [id number] systolic at home  No cp or palpitations or headaches or edema  No side effects to medicines  BP Readings from Last 3 Encounters:  01/12/16 140/84  11/29/15 162/70  07/24/15 144/84     Hyperlipidemia Hx of MI and other cardiac issues  (stable - yearly f/u Dr Radford Pax)  Lab Results  Component Value Date   CHOL 210* 01/04/2016   CHOL 236* 09/21/2014   CHOL 251* 11/09/2013   Lab Results  Component Value Date   HDL 52.00 01/04/2016   HDL 49.60 09/21/2014   HDL 45.10 11/09/2013   Lab Results  Component Value Date   LDLCALC 121* 01/04/2016   LDLCALC 148* 09/21/2014   Lab Results  Component Value Date   TRIG 183.0* 01/04/2016   TRIG 192.0* 09/21/2014   TRIG 171.0* 11/09/2013   Lab Results  Component Value  Date   CHOLHDL 4 01/04/2016   CHOLHDL 5 09/21/2014   CHOLHDL 6 11/09/2013   Lab Results  Component Value Date   LDLDIRECT 187.9 11/09/2013   LDLDIRECT 129.5 04/29/2007   on simvastatin and diet   a little improved Cannot get LDL to goal because she cannot tolerate more statin (cardiology aware)  Trying to improve diet   Patient Active Problem List   Diagnosis Date Noted  . Encounter for Medicare annual wellness exam 01/12/2016  . Routine general medical examination at a health care facility 01/12/2016  . Estrogen deficiency 01/12/2016  . Breast cancer screening 01/12/2016  . 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 Poor balance 06/06/2015  . Ataxia 06/06/2015  .  MI, old 05/22/2015  . Ischemic dilated cardiomyopathy   . Aortic insufficiency   . Dizziness 11/09/2013  . S/P excision of acoustic neuroma 11/09/2013  . Swelling of right ankle joint 06/28/2013  . Sprain of right knee 06/28/2013  . Sciatica of left side 05/21/2013  . Hip pain, bilateral 05/27/2012  . Other screening mammogram 05/27/2012  . ANKLE PAIN, RIGHT 11/16/2007  . FRACTURE, TIBIA 11/16/2007  . HIP PAIN 04/29/2007  . Osteoporosis 04/29/2007  . HYPERCHOLESTEROLEMIA 04/28/2007  . DEPRESSION 04/28/2007  . DECREASED HEARING, LEFT EAR 04/28/2007  . Essential hypertension 04/28/2007  . Mitral valve disorder 04/28/2007   Past Medical History  Diagnosis Date  . Anemia   . Arthritis   . Chicken pox   . Arterial disease ([place])   . Heart attack ([place]) 2006    nontransmural MI seconday to vasispasm w normal coronary arteries  . Old non-ST elevation myocardial infarction (NSTEMI)   . Ischemic dilated cardiomyopathy     resolved  . White coat hypertension   . High cholesterol   . Aortic insufficiency     mild to moderate by echo 12/2012 with mild pulmonary HTN PASP 35-51mHg, grade II diasotlic dysfunction  . MI, old 05/22/2015  . Pulmonary HTN ([place] 05/22/2015    Mild with PASP 358mg echo 09/2014   Past Surgical History  Procedure Laterality Date  . Cholecystectomy  1990  . Tonsillectomy and adenoidectomy  1946  . Acoustic neuroma   1991  . Ankle surgery  2009  . Facial plastic surgery  1996   Social History  Substance Use Topics  . Smoking status: Never Smoker   . Smokeless tobacco: None  . Alcohol Use: 0.0 oz/week    0 Standard drinks or equivalent per week     Comment: occ   Family History  Problem Relation Age of Onset  . Hypertension Father   . COPD Father   . Hypertension Sister    Allergies  Allergen Reactions  . Ace Inhibitors Cough  . Evista [Raloxifene] Other (See Comments)    Unsure what side eff  . Fosamax [Alendronate Sodium]     Dental problems    . Statins     Muscle pain    Current Outpatient Prescriptions on File Prior to Visit  Medication Sig Dispense Refill  . aspirin 81 MG tablet Take 81 mg by mouth daily.    . Calcium Carbonate-Vitamin D (CALCIUM-VITAMIN D) 500-200 MG-UNIT per tablet Take 1 tablet by mouth daily.     . carvedilol (COREG) 6.25 MG tablet TAKE 1 TABLET BY MOUTH 2 TIMES DAILY WITH A MEAL. 60 tablet 7  . naproxen sodium (ANAPROX) 220 MG tablet Take 220 mg by mouth as needed.     . valsartan (DIOVAN) 160 MG  tablet Take 1 tablet (160 mg total) by mouth daily. 90 tablet 1   No current facility-administered medications on file prior to visit.    Review of Systems Review of Systems  Constitutional: Negative for fever, appetite change, fatigue and unexpected weight change.  Eyes: Negative for pain and visual disturbance.  Respiratory: Negative for cough and shortness of breath.   Cardiovascular: Negative for cp or palpitations    Gastrointestinal: Negative for nausea, diarrhea and constipation.  Genitourinary: Negative for urgency and frequency.  Skin: Negative for pallor or rash   Neurological: Negative for weakness, light-headedness, numbness and headaches.  Hematological: Negative for adenopathy. Does not bruise/bleed easily.  Psychiatric/Behavioral: Negative for dysphoric mood. The patient is not nervous/anxious.         Objective:   Physical Exam  Constitutional: She appears well-developed and well-nourished. No distress.  Well appearing   HENT:  Head: Normocephalic and atraumatic.  Right Ear: External ear normal.  Left Ear: External ear normal.  Mouth/Throat: Oropharynx is clear and moist.  Eyes: Conjunctivae and EOM are normal. Pupils are equal, round, and reactive to light. No scleral icterus.  Neck: Normal range of motion. Neck supple. No JVD present. Carotid bruit is not present. No thyromegaly present.  Cardiovascular: Normal rate, regular rhythm and intact distal pulses.  Exam reveals no  gallop.   Murmur heard. Pulmonary/Chest: Effort normal and breath sounds normal. No respiratory distress. She has no wheezes. She exhibits no tenderness.  Abdominal: Soft. Bowel sounds are normal. She exhibits no distension, no abdominal bruit and no mass. There is no tenderness.  Genitourinary: No breast swelling, tenderness, discharge or bleeding.  Breast exam: No mass, nodules, thickening, tenderness, bulging, retraction, inflamation, nipple discharge or skin changes noted.  No axillary or clavicular LA.      Musculoskeletal: Normal range of motion. She exhibits no edema or tenderness.  Kyphosis noted   Lymphadenopathy:    She has no cervical adenopathy.  Neurological: She is alert. She has normal reflexes. No cranial nerve deficit. She exhibits normal muscle tone. Coordination normal.  Skin: Skin is warm and dry. No rash noted. No erythema. No pallor.  Psychiatric: She has a normal mood and affect.          Assessment & Plan:   Problem List Items Addressed This Visit      Cardiovascular and Mediastinum   Essential hypertension - Primary    bp in fair control at this time (baseline with whitecoat HTN which cardiology watches also) BP Readings from Last 1 Encounters:  01/12/16 140/84   No changes needed Disc lifstyle change with low sodium diet and exercise  Labs reviewed       Relevant Medications   simvastatin (ZOCOR) 20 MG tablet     Musculoskeletal and Integument   Osteoporosis    dexa planned -2y re check  Intol of bisphosphenate and evista  Pos hx for fx  Also balance problems- high risk for falls  Disc need for calcium/ vitamin D/ wt bearing exercise and bone density test every 2 y to monitor Disc safety/ fracture risk in detail          Other   Breast cancer screening    Scheduled annual screening mammogram Nl breast exam today  Encouraged monthly self exams        Relevant Orders   MM DIGITAL SCREENING BILATERAL   Encounter for Medicare annual  wellness exam    Reviewed health habits including diet and exercise and skin cancer  prevention Reviewed appropriate screening tests for age  Also reviewed health mt list, fam hx and immunization status , as well as social and family history   See HPI Labs rev Stop at check out for ref for mammogram and dexa  prevnar vaccine today  If you are interested in a shingles/zoster vaccine - call your insurance to check on coverage,( you should not get it within 1 month of other vaccines) , then call us for a prescription  for it to take to a pharmacy that gives the shot , or make a nurse visit to get it here depending on your coverage Take your calcium and D Keep exercising       Estrogen deficiency   Relevant Orders   DG Bone Density   HYPERCHOLESTEROLEMIA    zocor and diet-some imporvement  Disc goals for lipids and reasons to control them Rev labs with pt Rev low sat fat diet in detail       Relevant Medications   simvastatin (ZOCOR) 20 MG tablet   Routine general medical examination at a health care facility    Reviewed appropriate screening tests for age  Also reviewed health mt list, fam hx and immunization status , as well as social and family history   See HPI Labs rev Stop at check out for ref for mammogram and dexa  prevnar vaccine today  If you are interested in a shingles/zoster vaccine - call your insurance to check on coverage,( you should not get it within 1 month of other vaccines) , then call us for a prescription  for it to take to a pharmacy that gives the shot , or make a nurse visit to get it here depending on your coverage Take your calcium and D Keep exercising        Other Visit Diagnoses    Need for vaccination with 13-polyvalent pneumococcal conjugate vaccine        Relevant Orders    Pneumococcal conjugate vaccine 13-valent (Completed)

## 2016-01-12 NOTE — Patient Instructions (Signed)
Stop at check out for ref for mammogram and dexa  prevnar vaccine today  If you are interested in a shingles/zoster vaccine - call your insurance to check on coverage,( you should not get it within 1 month of other vaccines) , then call us for a prescription  for it to take to a pharmacy that gives the shot , or make a nurse visit to get it here depending on your coverage Take your calcium and D Keep exercising

## 2016-01-12 NOTE — Progress Notes (Signed)
Pre visit review using our clinic review tool, if applicable. No additional management support is needed unless otherwise documented below in the visit note. 

## 2016-01-14 NOTE — Assessment & Plan Note (Signed)
Reviewed health habits including diet and exercise and skin cancer prevention Reviewed appropriate screening tests for age  Also reviewed health mt list, fam hx and immunization status , as well as social and family history   See HPI Labs rev Stop at check out for ref for mammogram and dexa  prevnar vaccine today  If you are interested in a shingles/zoster vaccine - call your insurance to check on coverage,( you should not get it within 1 month of other vaccines) , then call us for a prescription  for it to take to a pharmacy that gives the shot , or make a nurse visit to get it here depending on your coverage Take your calcium and D Keep exercising

## 2016-01-14 NOTE — Assessment & Plan Note (Signed)
bp in fair control at this time (baseline with whitecoat HTN which cardiology watches also) BP Readings from Last 1 Encounters:  01/12/16 140/84   No changes needed Disc lifstyle change with low sodium diet and exercise  Labs reviewed

## 2016-01-14 NOTE — Assessment & Plan Note (Signed)
dexa planned -2y re check  Intol of bisphosphenate and evista  Pos hx for fx  Also balance problems- high risk for falls  Disc need for calcium/ vitamin D/ wt bearing exercise and bone density test every 2 y to monitor Disc safety/ fracture risk in detail

## 2016-01-14 NOTE — Assessment & Plan Note (Signed)
Scheduled annual screening mammogram Nl breast exam today  Encouraged monthly self exams   

## 2016-01-14 NOTE — Assessment & Plan Note (Signed)
Reviewed appropriate screening tests for age  Also reviewed health mt list, fam hx and immunization status , as well as social and family history   See HPI Labs rev Stop at check out for ref for mammogram and dexa  prevnar vaccine today  If you are interested in a shingles/zoster vaccine - call your insurance to check on coverage,( you should not get it within 1 month of other vaccines) , then call us for a prescription  for it to take to a pharmacy that gives the shot , or make a nurse visit to get it here depending on your coverage Take your calcium and D Keep exercising

## 2016-01-14 NOTE — Assessment & Plan Note (Signed)
zocor and diet-some imporvement  Disc goals for lipids and reasons to control them Rev labs with pt Rev low sat fat diet in detail

## 2016-01-19 DIAGNOSIS — Z1231 Encounter for screening mammogram for malignant neoplasm of breast: Secondary | ICD-10-CM | POA: Diagnosis not present

## 2016-01-19 LAB — HM MAMMOGRAPHY: HM Mammogram: NORMAL

## 2016-01-24 ENCOUNTER — Encounter: Payer: Self-pay | Admitting: Family Medicine

## 2016-01-25 ENCOUNTER — Encounter: Payer: Self-pay | Admitting: *Deleted

## 2016-02-05 ENCOUNTER — Ambulatory Visit
Admission: RE | Admit: 2016-02-05 | Discharge: 2016-02-05 | Disposition: A | Discharge status: Home / Self Care | Payer: Medicare Other | Source: Ambulatory Visit | Attending: Family Medicine | Admitting: Family Medicine

## 2016-02-05 DIAGNOSIS — M810 Age-related osteoporosis without current pathological fracture: Secondary | ICD-10-CM | POA: Diagnosis not present

## 2016-02-05 DIAGNOSIS — E2839 Other primary ovarian failure: Secondary | ICD-10-CM

## 2016-02-05 LAB — HM DEXA SCAN

## 2016-02-06 ENCOUNTER — Encounter: Payer: Self-pay | Admitting: *Deleted

## 2016-05-16 ENCOUNTER — Other Ambulatory Visit: Payer: Self-pay | Admitting: Cardiology

## 2016-08-30 ENCOUNTER — Ambulatory Visit (INDEPENDENT_AMBULATORY_CARE_PROVIDER_SITE_OTHER): Payer: Medicare Other

## 2016-08-30 DIAGNOSIS — Z23 Encounter for immunization: Secondary | ICD-10-CM

## 2016-11-12 ENCOUNTER — Other Ambulatory Visit: Payer: Self-pay | Admitting: Cardiology

## 2016-11-13 ENCOUNTER — Encounter: Payer: Self-pay | Admitting: *Deleted

## 2016-12-04 ENCOUNTER — Ambulatory Visit (INDEPENDENT_AMBULATORY_CARE_PROVIDER_SITE_OTHER): Payer: Medicare Other | Admitting: Cardiology

## 2016-12-04 ENCOUNTER — Encounter: Payer: Self-pay | Admitting: Cardiology

## 2016-12-04 VITALS — BP 182/88 | HR 79 | Ht 68.5 in | Wt 158.0 lb

## 2016-12-04 DIAGNOSIS — I420 Dilated cardiomyopathy: Secondary | ICD-10-CM | POA: Diagnosis not present

## 2016-12-04 DIAGNOSIS — I341 Nonrheumatic mitral (valve) prolapse: Secondary | ICD-10-CM | POA: Diagnosis not present

## 2016-12-04 DIAGNOSIS — I10 Essential (primary) hypertension: Secondary | ICD-10-CM | POA: Diagnosis not present

## 2016-12-04 DIAGNOSIS — I255 Ischemic cardiomyopathy: Secondary | ICD-10-CM

## 2016-12-04 NOTE — Progress Notes (Signed)
Cardiology Office Note    Date:  12/04/2016   ID:  Latoya Parrish, DOB 08-Jul-1941, MRN [id number]  PCP:  Roxy Manns, MD  Cardiologist:  Armanda Magic, MD   Chief Complaint  Patient presents with  . Cardiomyopathy  . Hypertension  . Mitral Regurgitation    History of Present Illness:  Latoya Parrish is a 76 y.o. female with a history of NSTEMI secondary to coronary artery vasospasm with normal coronary arteries at cath, HTN and dyslipidemia who presents today for followup. She is doing well. She denies any chest pain, palpitations or syncope. She has chronic DOE when going out in the heat but that is stable. She occasionally has some mild LE edema at the end of the day. She has occasional feeling of off balance related to prior acoustic neruoma and is followed by Neurology.  She says that the dizziness with balance problems is about the same.    Past Medical History:  Diagnosis Date  . Anemia   . Aortic insufficiency    mild by echo 12/2014   . Arthritis   . Chicken pox   . Heart attack 2006   nontransmural MI seconday to vasispasm w normal coronary arteries  . High cholesterol   . Ischemic dilated cardiomyopathy ([facility])    resolved  . MI, old 05/22/2015  . Mitral valve prolapse    with mild MR by echo 10/2015  . White coat hypertension     Past Surgical History:  Procedure Laterality Date  . acoustic neuroma   1991  . ANKLE SURGERY  2009  . CHOLECYSTECTOMY  1990  . facial plastic surgery  1996  . TONSILLECTOMY AND ADENOIDECTOMY  1946    Current Medications: Outpatient Medications Prior to Visit  Medication Sig Dispense Refill  . aspirin 81 MG tablet Take 81 mg by mouth daily.    . Calcium Carbonate-Vitamin D (CALCIUM-VITAMIN D) 500-200 MG-UNIT per tablet Take 1 tablet by mouth daily.     . carvedilol (COREG) 6.25 MG tablet TAKE 1 TABLET BY MOUTH 2 TIMES DAILY WITH A MEAL. 60 tablet 0  . naproxen sodium (ANAPROX) 220 MG tablet Take 220 mg by mouth as needed.     .  valsartan (DIOVAN) 160 MG tablet TAKE 1 TABLET (160 MG TOTAL) BY MOUTH DAILY. 90 tablet 1  . simvastatin (ZOCOR) 20 MG tablet Take 1 tablet (20 mg total) by mouth at bedtime. 90 tablet 3   No facility-administered medications prior to visit.      Allergies:   Ace inhibitors; Evista [raloxifene]; Fosamax [alendronate sodium]; and Statins   Social History   Social History  . Marital status: Married    Spouse name: N/A  . Number of children: N/A  . Years of education: N/A   Occupational History  . teacher    Social History Main Topics  . Smoking status: Never Smoker  . Smokeless tobacco: Never Used  . Alcohol use 0.0 oz/week     Comment: occ  . Drug use: No  . Sexual activity: Not Asked   Other Topics Concern  . None   Social History Narrative   Widowed in 2012    Working part time     Family History:  The patient's family history includes COPD in her father; Hypertension in her father and sister.   ROS:   Please see the history of present illness.    ROS All other systems reviewed and are negative.  No flowsheet data found.  PHYSICAL EXAM:   VS:  BP (!) 182/88   Pulse 79   Ht 5' 8.5" (1.74 m)   Wt 158 lb (71.7 kg)   BMI 23.67 kg/m    GEN: Well nourished, well developed, in no acute distress  HEENT: normal  Neck: no JVD, carotid bruits, or masses Cardiac: RRR; no rubs, or gallops,no edema.  Intact distal pulses bilaterally. 2/6 SM at RUSB Respiratory:  clear to auscultation bilaterally, normal work of breathing GI: soft, nontender, nondistended, + BS MS: no deformity or atrophy  Skin: warm and dry, no rash Neuro:  Alert and Oriented x 3, Strength and sensation are intact Psych: euthymic mood, full affect  Wt Readings from Last 3 Encounters:  12/04/16 158 lb (71.7 kg)  01/12/16 169 lb 12 oz (77 kg)  11/29/15 172 lb (78 kg)      Studies/Labs Reviewed:   EKG:  EKG is ordered today.  The ekg ordered today demonstrates NSR with no ST  changes  Recent Labs: 01/04/2016: ALT 17; BUN 19; Creatinine, Ser 0.88; Hemoglobin 12.5; Platelets 191.0; Potassium 4.5; Sodium 138; TSH 1.03   Lipid Panel    Component Value Date/Time   CHOL 210 (H) 01/04/2016 0907   TRIG 183.0 (H) 01/04/2016 0907   HDL 52.00 01/04/2016 0907   CHOLHDL 4 01/04/2016 0907   VLDL 36.6 01/04/2016 0907   LDLCALC 121 (H) 01/04/2016 0907   LDLDIRECT 187.9 11/09/2013 0835    Additional studies/ records that were reviewed today include:  none    ASSESSMENT:    1. Ischemic dilated cardiomyopathy ([facility])   2. Essential hypertension   3. Mitral valve prolapse      PLAN:  In order of problems listed above:  1. Ischemic DCM secondary to coronary artery vasospasm - resolved.  Continue BB and ARB.  Check BMET. 2. HTN - BP elevated today in the office but has documented white coat HTN.  Her BPs for the last week have ranged from 112-140/69-50mmHg. She weaned off to only 1 dose of Coreg daily due to dizziness which has helped some.  Continue coreg and ARB. 3. Mild MVP with mild MR - repeat echo in 1 year.    Medication Adjustments/Labs and Tests Ordered: Current medicines are reviewed at length with the patient today.  Concerns regarding medicines are outlined above.  Medication changes, Labs and Tests ordered today are listed in the Patient Instructions below.  There are no Patient Instructions on file for this visit.   Signed, Armanda Magic, MD  12/04/2016 8:19 AM    [facility] [address] [place], [place], [place]  [address] Phone: [phone]; Fax: [phone]

## 2016-12-04 NOTE — Patient Instructions (Signed)
Medication Instructions:  Your physician recommends that you continue on your current medications as directed. Please refer to the Current Medication list given to you today.   Labwork: TODAY: BMET  Testing/Procedures: Your physician has requested that you have an echocardiogram in ONE YEAR. Echocardiography is a painless test that uses sound waves to create images of your heart. It provides your doctor with information about the size and shape of your heart and how well your heart's chambers and valves are working. This procedure takes approximately one hour. There are no restrictions for this procedure.  Follow-Up: Your physician wants you to follow-up in: 1 year with Dr. Mayford Knifeurner. You will receive a reminder letter in the mail two months in advance. If you don't receive a letter, please call our office to schedule the follow-up appointment.   Any Other Special Instructions Will Be Listed Below (If Applicable).     If you need a refill on your cardiac medications before your next appointment, please call your pharmacy.

## 2016-12-05 LAB — BASIC METABOLIC PANEL
BUN / CREAT RATIO: 17 (ref 12–28)
BUN: 17 mg/dL (ref 8–27)
CO2: 22 mmol/L (ref 18–29)
Calcium: 9 mg/dL (ref 8.7–10.3)
Chloride: 105 mmol/L (ref 96–106)
Creatinine, Ser: 0.98 mg/dL (ref 0.57–1.00)
GFR, EST AFRICAN AMERICAN: 65 mL/min/{1.73_m2} (ref >59)
GFR, EST NON AFRICAN AMERICAN: 56 mL/min/{1.73_m2} — AB (ref >59)
Glucose: 92 mg/dL (ref 65–99)
POTASSIUM: 4.6 mmol/L (ref 3.5–5.2)
SODIUM: 141 mmol/L (ref 134–144)

## 2017-02-10 ENCOUNTER — Other Ambulatory Visit: Payer: Self-pay | Admitting: Cardiology

## 2017-06-05 DIAGNOSIS — H16212 Exposure keratoconjunctivitis, left eye: Secondary | ICD-10-CM | POA: Diagnosis not present

## 2017-06-05 DIAGNOSIS — H2513 Age-related nuclear cataract, bilateral: Secondary | ICD-10-CM | POA: Diagnosis not present

## 2017-06-11 ENCOUNTER — Telehealth: Payer: Self-pay | Admitting: Pharmacist

## 2017-06-11 MED ORDER — LOSARTAN POTASSIUM 50 MG PO TABS: 50.0000 mg | ORAL_TABLET | Freq: Every day | ORAL | 3 refills | Status: DC

## 2017-06-11 NOTE — Telephone Encounter (Signed)
Spoke to pt made aware of recall. Answered all questions. Advised to check pressure several times a week for 3-4 weeks after change to losartan with home cuff and call with any change in pressures. Pt states understanding and appreciation for call and information.

## 2017-07-16 ENCOUNTER — Ambulatory Visit (INDEPENDENT_AMBULATORY_CARE_PROVIDER_SITE_OTHER)
Admission: RE | Admit: 2017-07-16 | Discharge: 2017-07-16 | Disposition: A | Discharge status: Home / Self Care | Payer: Medicare Other | Source: Ambulatory Visit | Attending: Family Medicine | Admitting: Family Medicine

## 2017-07-16 ENCOUNTER — Ambulatory Visit (INDEPENDENT_AMBULATORY_CARE_PROVIDER_SITE_OTHER): Payer: Medicare Other | Admitting: Family Medicine

## 2017-07-16 ENCOUNTER — Encounter: Payer: Self-pay | Admitting: Family Medicine

## 2017-07-16 ENCOUNTER — Telehealth: Payer: Self-pay

## 2017-07-16 VITALS — BP 136/80 | HR 65 | Temp 97.7°F | Ht 67.5 in | Wt 165.8 lb

## 2017-07-16 DIAGNOSIS — M170 Bilateral primary osteoarthritis of knee: Secondary | ICD-10-CM | POA: Diagnosis not present

## 2017-07-16 DIAGNOSIS — M25562 Pain in left knee: Secondary | ICD-10-CM | POA: Diagnosis not present

## 2017-07-16 DIAGNOSIS — I10 Essential (primary) hypertension: Secondary | ICD-10-CM | POA: Diagnosis not present

## 2017-07-16 LAB — CBC WITH DIFFERENTIAL/PLATELET
BASOS ABS: 0 10*3/uL (ref 0.0–0.1)
Basophils Relative: 0.5 % (ref 0.0–3.0)
EOS PCT: 2 % (ref 0.0–5.0)
Eosinophils Absolute: 0.2 10*3/uL (ref 0.0–0.7)
HCT: 39.6 % (ref 36.0–46.0)
Hemoglobin: 12.8 g/dL (ref 12.0–15.0)
LYMPHS ABS: 2.5 10*3/uL (ref 0.7–4.0)
Lymphocytes Relative: 29.8 % (ref 12.0–46.0)
MCHC: 32.4 g/dL (ref 30.0–36.0)
MCV: 86.5 fl (ref 78.0–100.0)
MONO ABS: 0.8 10*3/uL (ref 0.1–1.0)
MONOS PCT: 9.3 % (ref 3.0–12.0)
NEUTROS ABS: 4.9 10*3/uL (ref 1.4–7.7)
NEUTROS PCT: 58.4 % (ref 43.0–77.0)
PLATELETS: 207 10*3/uL (ref 150.0–400.0)
RBC: 4.58 Mil/uL (ref 3.87–5.11)
RDW: 13.8 % (ref 11.5–15.5)
WBC: 8.4 10*3/uL (ref 4.0–10.5)

## 2017-07-16 LAB — COMPREHENSIVE METABOLIC PANEL
ALBUMIN: 4.6 g/dL (ref 3.5–5.2)
ALT: 15 U/L (ref 0–35)
AST: 19 U/L (ref 0–37)
Alkaline Phosphatase: 71 U/L (ref 39–117)
BUN: 18 mg/dL (ref 6–23)
CHLORIDE: 104 meq/L (ref 96–112)
CO2: 29 meq/L (ref 19–32)
CREATININE: 0.97 mg/dL (ref 0.40–1.20)
Calcium: 9.4 mg/dL (ref 8.4–10.5)
GFR: 59.12 mL/min — ABNORMAL LOW (ref >60.00)
Glucose, Bld: 98 mg/dL (ref 70–99)
Potassium: 4.5 mEq/L (ref 3.5–5.1)
SODIUM: 139 meq/L (ref 135–145)
Total Bilirubin: 0.5 mg/dL (ref 0.2–1.2)
Total Protein: 7.6 g/dL (ref 6.0–8.3)

## 2017-07-16 LAB — LIPID PANEL
CHOL/HDL RATIO: 5
Cholesterol: 249 mg/dL — ABNORMAL HIGH (ref 0–200)
HDL: 48.6 mg/dL (ref >39.00)
NONHDL: 200.02
Triglycerides: 244 mg/dL — ABNORMAL HIGH (ref 0.0–149.0)
VLDL: 48.8 mg/dL — ABNORMAL HIGH (ref 0.0–40.0)

## 2017-07-16 LAB — LDL CHOLESTEROL, DIRECT: LDL DIRECT: 157 mg/dL

## 2017-07-16 MED ORDER — DICLOFENAC SODIUM 1 % TD GEL: 2.0000 g | Freq: Four times a day (QID) | TRANSDERMAL | 3 refills | Status: DC

## 2017-07-16 NOTE — Telephone Encounter (Signed)
No- just let her know it is not covered- we knew it may not be  She will probably decline getting it  Thanks

## 2017-07-16 NOTE — Telephone Encounter (Signed)
Received a PA for the Voltaren Gel prescribed today. Insurance usually requires trial and failure of at least 2 NSAIDS unless she has a GI bleed. All I could find was the Aleve. Do you want to make a change?

## 2017-07-16 NOTE — Progress Notes (Signed)
Subjective:    Patient ID: Latoya Parrish, female    DOB: Oct 16, 1941, 76 y.o.   MRN: [id number]  HPI Here for leg pain for 2 weeks  Both legs-worse in the L knee   (this is the side she fx ankle and had surg and also bakers cyst and sciatica) ---so gait is affected by those factors  Hurts back and front and down the shin (sharp)  Movement after inactivity -stiff (but ok in am)  Now is beginning to hurt after walking  No swelling or redness that she can tell   R knee- just a little /not as bad   No new trauma  Takes aleve and it helps  (occ bothers stomach)  Has not tried topicals or ice or heat      Wt Readings from Last 3 Encounters:  07/16/17 165 lb 12 oz (75.2 kg)  12/04/16 158 lb (71.7 kg)  01/12/16 169 lb 12 oz (77 kg)   25.58 kg/m   BP: 136/80  Pulse Rate: 65  Temp: 97.7 F (36.5 C)   HTN Had her valsartan recalled  Was put on losartan --wondered if it is a side effect (joint pain) BP Readings from Last 3 Encounters:  07/16/17 136/80  12/04/16 (!) 182/88  01/12/16 140/84  due for HTN labs    Patient Active Problem List   Diagnosis Date Noted  . Knee pain, left 07/16/2017  . Mitral valve prolapse   . Encounter for Medicare annual wellness exam 01/12/2016  . Routine general medical examination at a health care facility 01/12/2016  . Estrogen deficiency 01/12/2016  . Breast cancer screening 01/12/2016  . 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 Poor balance 06/06/2015  . Ataxia 06/06/2015  . MI, old 05/22/2015  . Ischemic dilated cardiomyopathy (HCC)   . Aortic insufficiency   . Dizziness 11/09/2013  . S/P excision of acoustic neuroma 11/09/2013  . Swelling of right ankle joint 06/28/2013  . Sprain of right knee 06/28/2013  . Sciatica of left side 05/21/2013  . Hip pain, bilateral 05/27/2012  . Other screening mammogram 05/27/2012  . ANKLE PAIN, RIGHT 11/16/2007  . FRACTURE, TIBIA 11/16/2007  . HIP PAIN 04/29/2007  . Osteoporosis 04/29/2007    . HYPERCHOLESTEROLEMIA 04/28/2007  . DEPRESSION 04/28/2007  . DECREASED HEARING, LEFT EAR 04/28/2007  . Essential hypertension 04/28/2007  . Mitral valve disorder 04/28/2007   Past Medical History:  Diagnosis Date  . Anemia   . Aortic insufficiency    mild by echo 12/2014   . Arthritis   . Chicken pox   . Heart attack (HCC) 2006   nontransmural MI seconday to vasispasm w normal coronary arteries  . High cholesterol   . Ischemic dilated cardiomyopathy (HCC)    resolved  . MI, old 05/22/2015  . Mitral valve prolapse    with mild MR by echo 10/2015  . White coat hypertension    Past Surgical History:  Procedure Laterality Date  . acoustic neuroma   1991  . ANKLE SURGERY  2009  . CHOLECYSTECTOMY  1990  . facial plastic surgery  1996  . TONSILLECTOMY AND ADENOIDECTOMY  1946   Social History  Substance Use Topics  . Smoking status: Never Smoker  . Smokeless tobacco: Never Used  . Alcohol use 0.0 oz/week     Comment: occ   Family History  Problem Relation Age of Onset  . Hypertension Father   . COPD Father   . Hypertension Sister    Allergies  Allergen Reactions  . Ace Inhibitors Cough  . Evista [Raloxifene] Other (See Comments)    Unsure what side eff  . Fosamax [Alendronate Sodium]     Dental problems  . Statins     Muscle pain    Current Outpatient Prescriptions on File Prior to Visit  Medication Sig Dispense Refill  . aspirin 81 MG tablet Take 81 mg by mouth daily.    . Calcium Carbonate-Vitamin D (CALCIUM-VITAMIN D) 500-200 MG-UNIT per tablet Take 1 tablet by mouth daily.     . carvedilol (COREG) 6.25 MG tablet TAKE 1 TABLET BY MOUTH 2 TIMES DAILY WITH A MEAL. 180 tablet 2  . losartan (COZAAR) 50 MG tablet Take 1 tablet (50 mg total) by mouth daily. 90 tablet 3  . naproxen sodium (ANAPROX) 220 MG tablet Take 220 mg by mouth as needed.      No current facility-administered medications on file prior to visit.     Review of Systems Review of Systems   Constitutional: Negative for fever, appetite change, fatigue and unexpected weight change.  Eyes: Negative for pain and visual disturbance.  Respiratory: Negative for cough and shortness of breath.   Cardiovascular: Negative for cp or palpitations    Gastrointestinal: Negative for nausea, diarrhea and constipation.  Genitourinary: Negative for urgency and frequency.  Skin: Negative for pallor or rash   MSK pos for knee pain more on the L without swelling  Neurological: Negative for weakness, light-headedness, numbness and headaches.  Hematological: Negative for adenopathy. Does not bruise/bleed easily.  Psychiatric/Behavioral: Negative for dysphoric mood. The patient is not nervous/anxious.         Objective:   Physical Exam  Constitutional: She appears well-developed and well-nourished. No distress.  Well appearing   HENT:  Head: Normocephalic and atraumatic.  Eyes: Pupils are equal, round, and reactive to light. Conjunctivae and EOM are normal.  Neck: Normal range of motion. Neck supple.  Cardiovascular: Normal rate and regular rhythm.   Murmur heard. Pulmonary/Chest: Effort normal and breath sounds normal. No respiratory distress. She has no wheezes. She has no rales.  Abdominal: Soft. Bowel sounds are normal. There is no tenderness.  Musculoskeletal: She exhibits tenderness. She exhibits no edema or deformity.       Left knee: She exhibits decreased range of motion. She exhibits no swelling, no effusion, no ecchymosis, no deformity, no erythema, normal alignment, no LCL laxity, normal patellar mobility, normal meniscus and no MCL laxity. Tenderness found. Medial joint line tenderness noted.  L knee is tender over medial joint line  R knee-nontender  No crepitus  Pain to flex past 90 deg  Neg mc murray an bounce test     Lymphadenopathy:    She has no cervical adenopathy.  Neurological: No sensory deficit. She exhibits normal muscle tone.  Skin: Skin is warm and dry. No  rash noted. No erythema.  Psychiatric: She has a normal mood and affect.          Assessment & Plan:   Problem List Items Addressed This Visit      Cardiovascular and Mediastinum   Essential hypertension    bp in fair control at this time  BP Readings from Last 1 Encounters:  07/16/17 136/80   No changes needed Disc lifstyle change with low sodium diet and exercise  Valsartan was changed to losartan-doing well       Relevant Orders   CBC with Differential/Platelet   Comprehensive metabolic panel   Lipid panel   TSH  Other   Knee pain, left - Primary    L hurts more than right  Past hx of meniscal trauma More medial knee pain  Suspect some OA xr today and plan to follow Disc ice/elevation  Given px for voltaren gel to use instead of aleve if ins will cover      Relevant Orders   DG Knee 4 Views W/Patella Left (Completed)

## 2017-07-16 NOTE — Patient Instructions (Signed)
You may have arthritis in your knee Xray now  We will call with a result  Try ice when needed  Try the voltaren gel (if insurance will cover it) instead of the [employer] today

## 2017-07-17 ENCOUNTER — Encounter: Payer: Self-pay | Admitting: *Deleted

## 2017-07-17 LAB — TSH: TSH: 1.16 u[IU]/mL (ref 0.35–4.50)

## 2017-07-17 NOTE — Assessment & Plan Note (Signed)
L hurts more than right  Past hx of meniscal trauma More medial knee pain  Suspect some OA xr today and plan to follow Disc ice/elevation  Given px for voltaren gel to use instead of aleve if ins will cover

## 2017-07-17 NOTE — Assessment & Plan Note (Addendum)
bp in fair control at this time  BP Readings from Last 1 Encounters:  07/16/17 136/80   No changes needed Disc lifstyle change with low sodium diet and exercise  Valsartan was changed to losartan-doing well  Labs today

## 2017-07-17 NOTE — Telephone Encounter (Signed)
Pt notified she will check to see how much it will be out of pocket

## 2017-07-31 ENCOUNTER — Ambulatory Visit (INDEPENDENT_AMBULATORY_CARE_PROVIDER_SITE_OTHER): Payer: Medicare Other | Admitting: Orthopedic Surgery

## 2017-07-31 ENCOUNTER — Encounter (INDEPENDENT_AMBULATORY_CARE_PROVIDER_SITE_OTHER): Payer: Self-pay | Admitting: Orthopedic Surgery

## 2017-07-31 DIAGNOSIS — M1712 Unilateral primary osteoarthritis, left knee: Secondary | ICD-10-CM | POA: Diagnosis not present

## 2017-08-01 NOTE — Progress Notes (Signed)
Office Visit Note   Patient: Latoya Parrish           Date of Birth: 17-Dec-1940           MRN: [id number] Visit Date: 07/31/2017 Requested by: Tower, Audrie Gallus, MD [address] [place], [place] [address] PCP: Judy Pimple, MD  Subjective: Chief Complaint  Patient presents with  . 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  Radiographs were done and reviewed.  She does have some mild medial joint space narrowing and spurring.  She states that the pain comes and goes.  She takes Aleve as needed.  She has a history of left ankle surgery in 2009.  She also has has a history of sciatica affecting the left-hand side.  It does hurt her to walk at times in the posterior aspect of her knee.              ROS: All systems reviewed are negative as they relate to the chief complaint within the history of present illness.  Patient denies  fevers or chills.   Assessment & Plan: Visit Diagnoses: No diagnosis found.  Plan: Impression is left knee pain with medial compartment arthritis.  This appears to be mild on radiographic examination and she has no effusion today and no discrete mechanical symptoms.  I don't think this is related to her back.  Next step in treatment for this would be a knee injection but she's not quite symptomatic enough for that today.  Continue with Aleve and leg strengthening exercises.  I will see her back as needed  Follow-Up Instructions: Return if symptoms worsen or fail to improve.   Orders:  No orders of the defined types were placed in this encounter.  No orders of the defined types were placed in this encounter.     Procedures: No procedures performed   Clinical Data: No additional findings.  Objective: Vital Signs: There were no vitals taken for this visit.  Physical Exam:   Constitutional: Patient appears well-developed HEENT:  Head: Normocephalic Eyes:EOM  are normal Neck: Normal range of motion Cardiovascular: Normal rate Pulmonary/chest: Effort normal Neurologic: Patient is alert Skin: Skin is warm Psychiatric: Patient has normal mood and affect    Ortho Exam: Orthopedic exam demonstrates slight valgus alignment left lower extremity palpable pedal pulses no groin pain with internal/external rotation of the leg no nerve retention signs no paresthesias in the left leg left knee has good range of motion mild patellofemoral crepitus.  Full flexion stable collateral and cruciate ligaments no other masses lymph adenopathy or skin changes noted in the left knee region and I do not palpate a Baker cyst in the back of the knee  Specialty Comments:  No specialty comments available.  Imaging: No results found.   PMFS History: Patient Active Problem List   Diagnosis Date Noted  . Knee pain, left 07/16/2017  . Mitral valve prolapse   . Encounter for Medicare annual wellness exam 01/12/2016  . Routine general medical examination at a health care facility 01/12/2016  . Estrogen deficiency 01/12/2016  . Breast cancer screening 01/12/2016  . 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 Poor balance 06/06/2015  . Ataxia 06/06/2015  . MI, old 05/22/2015  . Ischemic dilated cardiomyopathy (HCC)   . Aortic insufficiency   . Dizziness 11/09/2013  . S/P excision of acoustic neuroma 11/09/2013  .  Swelling of right ankle joint 06/28/2013  . Sprain of right knee 06/28/2013  . Sciatica of left side 05/21/2013  . Hip pain, bilateral 05/27/2012  . Other screening mammogram 05/27/2012  . ANKLE PAIN, RIGHT 11/16/2007  . FRACTURE, TIBIA 11/16/2007  . HIP PAIN 04/29/2007  . Osteoporosis 04/29/2007  . HYPERCHOLESTEROLEMIA 04/28/2007  . DEPRESSION 04/28/2007  . DECREASED HEARING, LEFT EAR 04/28/2007  . Essential hypertension 04/28/2007  . Mitral valve disorder 04/28/2007   Past Medical History:  Diagnosis Date  . Anemia   . Aortic insufficiency     mild by echo 12/2014   . Arthritis   . Chicken pox   . Heart attack (HCC) 2006   nontransmural MI seconday to vasispasm w normal coronary arteries  . High cholesterol   . Ischemic dilated cardiomyopathy (HCC)    resolved  . MI, old 05/22/2015  . Mitral valve prolapse    with mild MR by echo 10/2015  . White coat hypertension     Family History  Problem Relation Age of Onset  . Hypertension Father   . COPD Father   . Hypertension Sister     Past Surgical History:  Procedure Laterality Date  . acoustic neuroma   1991  . ANKLE SURGERY  2009  . CHOLECYSTECTOMY  1990  . facial plastic surgery  1996  . TONSILLECTOMY AND ADENOIDECTOMY  1946   Social History   Occupational History  . teacher    Social History Main Topics  . Smoking status: Never Smoker  . Smokeless tobacco: Never Used  . Alcohol use 0.0 oz/week     Comment: occ  . Drug use: No  . Sexual activity: Not on file

## 2017-09-24 ENCOUNTER — Ambulatory Visit (INDEPENDENT_AMBULATORY_CARE_PROVIDER_SITE_OTHER): Payer: Medicare Other

## 2017-09-24 DIAGNOSIS — Z23 Encounter for immunization: Secondary | ICD-10-CM | POA: Diagnosis not present

## 2017-12-04 ENCOUNTER — Other Ambulatory Visit: Payer: Self-pay

## 2017-12-04 ENCOUNTER — Ambulatory Visit (HOSPITAL_COMMUNITY): Payer: Medicare Other | Attending: Cardiology

## 2017-12-04 DIAGNOSIS — I10 Essential (primary) hypertension: Secondary | ICD-10-CM | POA: Insufficient documentation

## 2017-12-04 DIAGNOSIS — I252 Old myocardial infarction: Secondary | ICD-10-CM | POA: Diagnosis not present

## 2017-12-04 DIAGNOSIS — I272 Pulmonary hypertension, unspecified: Secondary | ICD-10-CM | POA: Insufficient documentation

## 2017-12-04 DIAGNOSIS — I341 Nonrheumatic mitral (valve) prolapse: Secondary | ICD-10-CM | POA: Insufficient documentation

## 2017-12-04 DIAGNOSIS — I420 Dilated cardiomyopathy: Secondary | ICD-10-CM | POA: Diagnosis not present

## 2017-12-04 DIAGNOSIS — I351 Nonrheumatic aortic (valve) insufficiency: Secondary | ICD-10-CM | POA: Diagnosis not present

## 2017-12-04 LAB — ECHOCARDIOGRAM COMPLETE
AVPHT: 382 ms
CHL CUP DOP CALC LVOT VTI: 48.4 cm
CHL CUP TV REG PEAK VELOCITY: 267 cm/s
E decel time: 225 msec
EERAT: 9.8
FS: 31 % (ref 28–44)
IVS/LV PW RATIO, ED: 1.28
LA ID, A-P, ES: 42 mm
LA diam end sys: 42 mm
LADIAMINDEX: 2.23 cm/m2
LAVOLA4C: 69 mL
LV E/e' medial: 9.8
LVEEAVG: 9.8
LVELAT: 9.78 cm/s
LVOT SV: 167 mL
LVOT area: 3.46 cm2
LVOT diameter: 21 mm
LVOT peak grad rest: 20 mmHg
LVOTPV: 226 cm/s
MV Dec: 225
MV Peak grad: 4 mmHg
MVPKAVEL: 77.5 m/s
MVPKEVEL: 95.8 m/s
PW: 12.6 mm — AB (ref 0.6–1.1)
TDI e' lateral: 9.78
TDI e' medial: 5.83
TRMAXVEL: 267 cm/s

## 2017-12-17 NOTE — Progress Notes (Signed)
Cardiology Office Note:    Date:  12/18/2017   ID:  [name], DOB 06-07-1941, MRN [id number]  PCP:  Judy Pimple, MD  Cardiologist:  No primary care provider on file.    Referring MD: Judy Pimple, MD   Chief Complaint  Patient presents with  . Follow-up    DCM, HTN    History of Present Illness:    Latoya Parrish is a 77 y.o. female with a hx of NSTEMI secondary to coronary artery vasospasm with normal coronary arteries at cath, HTN and dyslipidemia.  She is here today for followup and is doing well.  She denies any chest pain or pressure, SOB, DOE, PND, orthopnea, LE edema (except if she has been on her feet all day), dizziness, palpitations or syncope. She is compliant with her meds and is tolerating meds with no SE.    Past Medical History:  Diagnosis Date  . Anemia   . Aortic insufficiency    mild by echo 12/2014   . Arthritis   . Chicken pox   . Heart attack (HCC) 2006   nontransmural MI seconday to vasispasm w normal coronary arteries  . High cholesterol   . Ischemic dilated cardiomyopathy (HCC)    resolved  . MI, old 05/22/2015  . Mitral valve prolapse    with mild MR by echo 10/2015  . White coat hypertension     Past Surgical History:  Procedure Laterality Date  . acoustic neuroma   1991  . ANKLE SURGERY  2009  . CHOLECYSTECTOMY  1990  . facial plastic surgery  1996  . TONSILLECTOMY AND ADENOIDECTOMY  1946    Current Medications: Current Meds  Medication Sig  . aspirin 81 MG tablet Take 81 mg by mouth daily.  . Calcium Carbonate-Vitamin D (CALCIUM-VITAMIN D) 500-200 MG-UNIT per tablet Take 1 tablet by mouth daily.   . carvedilol (COREG) 6.25 MG tablet Take 6.25 mg by mouth daily.  . diclofenac sodium (VOLTAREN) 1 % GEL Apply 2 g topically 4 (four) times daily. To painful knee  . naproxen sodium (ANAPROX) 220 MG tablet Take 220 mg by mouth as needed.      Allergies:   Ace inhibitors; Evista [raloxifene]; Fosamax [alendronate sodium]; and  Statins   Social History   Socioeconomic History  . Marital status: Married    Spouse name: None  . Number of children: None  . Years of education: None  . Highest education level: None  Social Needs  . Financial resource strain: None  . Food insecurity - worry: None  . Food insecurity - inability: None  . Transportation needs - medical: None  . Transportation needs - non-medical: None  Occupational History  . Occupation: Runner, broadcasting/film/video  Tobacco Use  . Smoking status: Never Smoker  . Smokeless tobacco: Never Used  Substance and Sexual Activity  . Alcohol use: Yes    Alcohol/week: 0.0 oz    Comment: occ  . Drug use: No  . Sexual activity: None  Other Topics Concern  . None  Social History Narrative   Widowed in 2012    Working part time     Family History: The patient's family history includes COPD in her father; Hypertension in her father and sister.  ROS:   Please see the history of present illness.    ROS  All other systems reviewed and negative.   EKGs/Labs/Other Studies Reviewed:    The following studies were reviewed today: none  EKG:  EKG is  ordered today.  The ekg ordered today demonstrates NSR at 70bpm with nonspecific ST abnormality  Recent Labs: 07/16/2017: ALT 15; BUN 18; Creatinine, Ser 0.97; Hemoglobin 12.8; Platelets 207.0; Potassium 4.5; Sodium 139; TSH 1.16   Recent Lipid Panel    Component Value Date/Time   CHOL 249 (H) 07/16/2017 1209   TRIG 244.0 (H) 07/16/2017 1209   HDL 48.60 07/16/2017 1209   CHOLHDL 5 07/16/2017 1209   VLDL 48.8 (H) 07/16/2017 1209   LDLCALC 121 (H) 01/04/2016 0907   LDLDIRECT 157.0 07/16/2017 1209    Physical Exam:    VS:  BP (!) 174/84   Pulse 70   Ht 5' 7.5" (1.715 m)   Wt 165 lb 3.2 oz (74.9 kg)   SpO2 95%   BMI 25.49 kg/m     Wt Readings from Last 3 Encounters:  12/18/17 165 lb 3.2 oz (74.9 kg)  07/16/17 165 lb 12 oz (75.2 kg)  12/04/16 158 lb (71.7 kg)     GEN:  Well nourished, well developed in no  acute distress HEENT: Normal NECK: No JVD; No carotid bruits LYMPHATICS: No lymphadenopathy CARDIAC: RRR, no murmurs, rubs, gallops RESPIRATORY:  Clear to auscultation without rales, wheezing or rhonchi  ABDOMEN: Soft, non-tender, non-distended MUSCULOSKELETAL:  No edema; No deformity  SKIN: Warm and dry NEUROLOGIC:  Alert and oriented x 3 PSYCHIATRIC:  Normal affect   ASSESSMENT:    1. Ischemic dilated cardiomyopathy (HCC)   2. Essential hypertension   3. Mitral valve disorder    PLAN:    In order of problems listed above:  1.  Ischemic DCM secondary to coronary artery vasospasm.  Last assessment of EF was by echo 11/2017 with EF 60-65%.  She has no signs of HF on exam today.   2.  HTN - her BP is elevated  on exam today but she has white coat syndrome.  She brought in her BPs from home which range from 133-172/37mmHg .  She decreased her carvedilol 6.25mg  once daily due to balance issues that were made worse with twice daily dosing.  This was about a year ago and since then her BP has trended upward .  She would like to change ARBs due to the Losartan recall.  We discussed options for BP management and have decided to stop carvedilol and Losartan and start Amlodipine 10mg  daily.    3.  MVP with MR - echo 11/2017 showed no MVP or MR  4.  Mild AR by echo 11/2017.   Medication Adjustments/Labs and Tests Ordered: Current medicines are reviewed at length with the patient today.  Concerns regarding medicines are outlined above.  Orders Placed This Encounter  Procedures  . EKG 12-Lead   No orders of the defined types were placed in this encounter.   Signed, Armanda Magic, MD  12/18/2017 7:57 AM    [facility]

## 2017-12-18 ENCOUNTER — Ambulatory Visit: Payer: Medicare Other | Admitting: Cardiology

## 2017-12-18 ENCOUNTER — Encounter: Payer: Self-pay | Admitting: Cardiology

## 2017-12-18 VITALS — BP 174/84 | HR 70 | Ht 67.5 in | Wt 165.2 lb

## 2017-12-18 DIAGNOSIS — I059 Rheumatic mitral valve disease, unspecified: Secondary | ICD-10-CM | POA: Diagnosis not present

## 2017-12-18 DIAGNOSIS — I255 Ischemic cardiomyopathy: Secondary | ICD-10-CM

## 2017-12-18 DIAGNOSIS — I420 Dilated cardiomyopathy: Secondary | ICD-10-CM

## 2017-12-18 DIAGNOSIS — I10 Essential (primary) hypertension: Secondary | ICD-10-CM

## 2017-12-18 MED ORDER — AMLODIPINE BESYLATE 10 MG PO TABS: 10.0000 mg | ORAL_TABLET | Freq: Every day | ORAL | 3 refills | Status: DC

## 2017-12-18 NOTE — Patient Instructions (Addendum)
Medication Instructions:  STOP: Losartan STOP: Coreg  START: amlodipine 10 mg once a day  Labwork: None ordered   Testing/Procedures: None ordered   Follow-Up: Your physician recommends that you schedule a follow-up appointment in: 2 week at the hypertension clinic. Check blood pressure daily bring BP readings with you to your follow up appointment.   Your physician wants you to follow-up in: 1 year with Dr. Mayford Knifeurner. You will receive a reminder letter in the mail two months in advance. If you don't receive a letter, please call our office to schedule the follow-up appointment.     Any Other Special Instructions Will Be Listed Below (If Applicable).     If you need a refill on your cardiac medications before your next appointment, please call your pharmacy.

## 2017-12-29 ENCOUNTER — Telehealth: Payer: Self-pay | Admitting: Cardiology

## 2017-12-29 MED ORDER — LOSARTAN POTASSIUM 50 MG PO TABS: 50.0000 mg | ORAL_TABLET | Freq: Every day | ORAL | 0 refills | Status: DC

## 2017-12-29 MED ORDER — CARVEDILOL 6.25 MG PO TABS: 6.2500 mg | ORAL_TABLET | Freq: Two times a day (BID) | ORAL | 3 refills | Status: DC

## 2017-12-29 NOTE — Telephone Encounter (Signed)
Informed patient to STOP amlodipine and START coreg 6.25 mg two times a day and losartan 50 mg once a day. Instructed patient to record blood pressures daily for the next week and call the office with BP readings. Patient in agreement with plan and thanked me for the call.

## 2017-12-29 NOTE — Telephone Encounter (Signed)
Pt c/o medication issue:  1. Name of Medication: Amlodipine   2. How are you currently taking this medication (dosage and times per day)? 10 mg and 1x daily  3. Are you having a reaction (difficulty breathing--STAT)? No 4. What is your medication issue? Patient states that she was instructed to call back if she had any problems with the medication. Patient states that she has been on the medication for a week.

## 2017-12-29 NOTE — Telephone Encounter (Signed)
I am ok for her to go back on Carvedilol and Losartan but she needs to check with her pharmacist to make sure her Losartan was not one of the lots affected by the recall.  Also she needs to take her Carvedilol BID.  Please have her check her BP daily for a week and call with results.

## 2017-12-29 NOTE — Telephone Encounter (Signed)
Patient c/o ankle swelling, fatigue and some rapid HR since she has been taking amlodipine. She notices the rapid heart beats at night. Patient denies sob, dizziness, chest pain or syncope. Patient states her blood pressures have been ranging in 120/70's. Patient is concerned if she should go back on coreg and losartan. Informed patient I would forward to Dr. Mayford Knifeurner for further recommendations. Patient verbalized understanding and thanked me for the call.

## 2018-01-07 ENCOUNTER — Ambulatory Visit (INDEPENDENT_AMBULATORY_CARE_PROVIDER_SITE_OTHER): Payer: Medicare Other | Admitting: Pharmacist

## 2018-01-07 ENCOUNTER — Encounter: Payer: Self-pay | Admitting: Family Medicine

## 2018-01-07 VITALS — BP 128/84 | HR 69

## 2018-01-07 DIAGNOSIS — I10 Essential (primary) hypertension: Secondary | ICD-10-CM

## 2018-01-07 DIAGNOSIS — Z1231 Encounter for screening mammogram for malignant neoplasm of breast: Secondary | ICD-10-CM | POA: Diagnosis not present

## 2018-01-07 NOTE — Progress Notes (Signed)
Patient ID: Latoya Parrish                 DOB: 01/18/1941                      MRN: [id number]     HPI: Latoya Parrish is a 77 y.o. female referred by Dr. Mayford Knife to HTN clinic. PMH is significant for NSTEMI in 2006 secondary to coronary artery vasospasm with normal coronary arteries at cath, white coat HTN, and HLD. At her last visit a month ago, pt was only taking her carvedilol once daily due to balance issues on BID dosing and she was concerned about the losartan recall. Both of these medications were discontinued and patient was started on amlodipine 10mg  daily. She called clinic 1 week later with complaints of ankle swelling, fatigue, and rapid HR. She was switched back to carvedilol 6.25mg  BID and losartan 50mg  daily and presents today for follow up.  Pt presents today in good spirits. She has been taking her carvedilol twice daily and her losartan without adverse events. She denies dizziness, blurred vision, falls, or headaches. She does have some balance issues at baseline, however restarting her BP medications has not exacerbated this. She uses naproxen once weekly for osteoarthritis.   Her home readings have improved and have ranged 112/63 - 134/78, HR 60-70s.  Current HTN meds: carvedilol 6.25mg  BID, losartan 50mg  daily (PM) Previously tried: carvedilol - balance issues on BID dosing, losartan - recall, amlodipine 10mg  daily - ankle swelling, fatigue, rapid HR BP goal: <140/74mmHg due to age and balance issues  Family History: COPD in her father; Hypertension in her father and sister.  Social History: Denies tobacco and illicit drug use, occasional alcohol use.  Diet: Drinks 1 cup of coffee each day. Does add salt to her food  Exercise: Stays active on her feet. Was also participating in a yoga class. Has a stationary bike at home.   Wt Readings from Last 3 Encounters:  12/18/17 165 lb 3.2 oz (74.9 kg)  07/16/17 165 lb 12 oz (75.2 kg)  12/04/16 158 lb (71.7 kg)   BP Readings  from Last 3 Encounters:  12/18/17 (!) 174/84  07/16/17 136/80  12/04/16 (!) 182/88   Pulse Readings from Last 3 Encounters:  12/18/17 70  07/16/17 65  12/04/16 79    Renal function: CrCl cannot be calculated (Patient's most recent lab result is older than the maximum 21 days allowed.).  Past Medical History:  Diagnosis Date  . Anemia   . Aortic insufficiency    mild by echo 12/2014   . Arthritis   . Chicken pox   . Heart attack (HCC) 2006   nontransmural MI seconday to vasispasm w normal coronary arteries  . High cholesterol   . Ischemic dilated cardiomyopathy (HCC)    resolved  . MI, old 05/22/2015  . Mitral valve prolapse    with mild MR by echo 10/2015  . White coat hypertension     Current Outpatient Medications on File Prior to Visit  Medication Sig Dispense Refill  . aspirin 81 MG tablet Take 81 mg by mouth daily.    . Calcium Carbonate-Vitamin D (CALCIUM-VITAMIN D) 500-200 MG-UNIT per tablet Take 1 tablet by mouth daily.     . carvedilol (COREG) 6.25 MG tablet Take 1 tablet (6.25 mg total) by mouth 2 (two) times daily. 180 tablet 3  . diclofenac sodium (VOLTAREN) 1 % GEL Apply 2 g topically 4 (four) times  daily. To painful knee 100 g 3  . losartan (COZAAR) 50 MG tablet Take 1 tablet (50 mg total) by mouth daily. 90 tablet 0  . naproxen sodium (ANAPROX) 220 MG tablet Take 220 mg by mouth as needed.      No current facility-administered medications on file prior to visit.     Allergies  Allergen Reactions  . Ace Inhibitors Cough  . Evista [Raloxifene] Other (See Comments)    Unsure what side eff  . Fosamax [Alendronate Sodium]     Dental problems  . Statins     Muscle pain      Assessment/Plan:  1. Hypertension - Home BP readings and clinic reading today are all well below goal <140/31mmHg. Will continue carvedilol 6.25mg  BID and losartan 50mg  daily as patient is tolerating her medications well. F/u in HTN clinic as needed.   Anokhi Shannon E. Antwonette Feliz, PharmD,  CPP, BCACP [facility] [address], [place], [place] [address] Phone: [phone]; Fax: [phone] 01/07/2018 11:07 AM

## 2018-01-07 NOTE — Patient Instructions (Signed)
It was nice to meet you today  Your blood pressure looks excellent, continue taking your current medications  Follow up as needed

## 2018-01-14 ENCOUNTER — Encounter: Payer: Self-pay | Admitting: Family Medicine

## 2018-01-14 DIAGNOSIS — R928 Other abnormal and inconclusive findings on diagnostic imaging of breast: Secondary | ICD-10-CM | POA: Diagnosis not present

## 2018-01-14 DIAGNOSIS — Z853 Personal history of malignant neoplasm of breast: Secondary | ICD-10-CM | POA: Diagnosis not present

## 2018-03-16 ENCOUNTER — Other Ambulatory Visit: Payer: Self-pay | Admitting: Cardiology

## 2018-03-23 ENCOUNTER — Other Ambulatory Visit: Payer: Self-pay | Admitting: Cardiology

## 2018-03-25 NOTE — Telephone Encounter (Signed)
   Quintella Reichert, MD    Cardiology     Conversation  (Newest Message First)   Phineas Semen, RN       12/29/17 12:42 PM  Note    Informed patient to STOP amlodipine and START coreg 6.25 mg two times a day and losartan 50 mg once a day. Instructed patient to record blood pressures daily for the next week and call the office with BP readings. Patient in agreement with plan and thanked me for the call.

## 2018-06-01 DIAGNOSIS — H02839 Dermatochalasis of unspecified eye, unspecified eyelid: Secondary | ICD-10-CM | POA: Diagnosis not present

## 2018-06-01 DIAGNOSIS — Z411 Encounter for cosmetic surgery: Secondary | ICD-10-CM | POA: Diagnosis not present

## 2018-06-22 ENCOUNTER — Other Ambulatory Visit: Payer: Self-pay | Admitting: Cardiology

## 2018-09-17 ENCOUNTER — Ambulatory Visit: Payer: Self-pay

## 2018-12-07 ENCOUNTER — Ambulatory Visit: Payer: Self-pay | Admitting: Family Medicine

## 2018-12-16 ENCOUNTER — Other Ambulatory Visit: Payer: Self-pay | Admitting: Cardiology

## 2019-01-25 ENCOUNTER — Encounter: Payer: Self-pay | Admitting: Family Medicine

## 2019-01-25 ENCOUNTER — Ambulatory Visit (INDEPENDENT_AMBULATORY_CARE_PROVIDER_SITE_OTHER): Payer: Medicare Other | Admitting: Family Medicine

## 2019-01-25 VITALS — BP 138/78 | HR 66 | Temp 98.6°F | Ht 67.0 in | Wt 161.4 lb

## 2019-01-25 DIAGNOSIS — M810 Age-related osteoporosis without current pathological fracture: Secondary | ICD-10-CM | POA: Diagnosis not present

## 2019-01-25 DIAGNOSIS — Z1231 Encounter for screening mammogram for malignant neoplasm of breast: Secondary | ICD-10-CM | POA: Diagnosis not present

## 2019-01-25 DIAGNOSIS — E2839 Other primary ovarian failure: Secondary | ICD-10-CM | POA: Diagnosis not present

## 2019-01-25 DIAGNOSIS — I10 Essential (primary) hypertension: Secondary | ICD-10-CM

## 2019-01-25 DIAGNOSIS — E7800 Pure hypercholesterolemia, unspecified: Secondary | ICD-10-CM | POA: Diagnosis not present

## 2019-01-25 LAB — COMPREHENSIVE METABOLIC PANEL
ALT: 14 U/L (ref 0–35)
AST: 16 U/L (ref 0–37)
Albumin: 4.6 g/dL (ref 3.5–5.2)
Alkaline Phosphatase: 68 U/L (ref 39–117)
BUN: 16 mg/dL (ref 6–23)
CO2: 28 mEq/L (ref 19–32)
Calcium: 9.4 mg/dL (ref 8.4–10.5)
Chloride: 104 mEq/L (ref 96–112)
Creatinine, Ser: 1 mg/dL (ref 0.40–1.20)
GFR: 53.49 mL/min — ABNORMAL LOW (ref >60.00)
GLUCOSE: 88 mg/dL (ref 70–99)
Potassium: 4.5 mEq/L (ref 3.5–5.1)
Sodium: 138 mEq/L (ref 135–145)
Total Bilirubin: 0.6 mg/dL (ref 0.2–1.2)
Total Protein: 7.3 g/dL (ref 6.0–8.3)

## 2019-01-25 LAB — LIPID PANEL
Cholesterol: 231 mg/dL — ABNORMAL HIGH (ref 0–200)
HDL: 48.5 mg/dL (ref >39.00)
NonHDL: 182.78
Total CHOL/HDL Ratio: 5
Triglycerides: 259 mg/dL — ABNORMAL HIGH (ref 0.0–149.0)
VLDL: 51.8 mg/dL — ABNORMAL HIGH (ref 0.0–40.0)

## 2019-01-25 LAB — VITAMIN D 25 HYDROXY (VIT D DEFICIENCY, FRACTURES): VITD: 34.41 ng/mL (ref 30.00–100.00)

## 2019-01-25 LAB — LDL CHOLESTEROL, DIRECT: Direct LDL: 145 mg/dL

## 2019-01-25 MED ORDER — RALOXIFENE HCL 60 MG PO TABS: 60.0000 mg | ORAL_TABLET | Freq: Every day | ORAL | 11 refills | Status: DC

## 2019-01-25 NOTE — Assessment & Plan Note (Signed)
Ref for annual mammogram

## 2019-01-25 NOTE — Assessment & Plan Note (Signed)
bp in fair control at this time  BP Readings from Last 1 Encounters:  01/25/19 138/78   No changes needed Most recent labs reviewed  Disc lifstyle change with low sodium diet and exercise  For f/u with cardiology soon

## 2019-01-25 NOTE — Progress Notes (Signed)
Subjective:    Patient ID: Latoya Parrish, female    DOB: March 07, 1941, 78 y.o.   MRN: [id number]  HPI Here for f/u of OP  Has other issues as well   Wt Readings from Last 3 Encounters:  01/25/19 161 lb 6 oz (73.2 kg)  12/18/17 165 lb 3.2 oz (74.9 kg)  07/16/17 165 lb 12 oz (75.2 kg)   25.27 kg/m   Last dexa was 2017  Had OP of the FN and osteopenia of forearm  Has tried fosamax and evista in the past  Fosamax- dental issue evista- does not remember  Was going to look into prolia  Vit D level 45 in 2013  Her ht has gone down  No falls in the past year  Has fx ankle in the past  Also ? R arm/shoulder Family hx of OP - mother had kyphosis   Takes aleve for arthritis pain  2 times per week max    Is interested in evista again  No hx of clots (her heart is recovered) - vasospastic issue   bp is stable today  Has been up and down at home  Has f/u with cardiology/Dr Turner son  No cp or palpitations or headaches or edema  No side effects to medicines  BP Readings from Last 3 Encounters:  01/25/19 138/78  01/07/18 128/84  12/18/17 (!) 174/84      Due for cholesterol check  H/o hyperlipidemia  Lab Results  Component Value Date   CHOL 249 (H) 07/16/2017   HDL 48.60 07/16/2017   LDLCALC 121 (H) 01/04/2016   LDLDIRECT 157.0 07/16/2017   TRIG 244.0 (H) 07/16/2017   CHOLHDL 5 07/16/2017   Eats a healthy diet   Due for mammogram  No lumps on self exam  Patient Active Problem List   Diagnosis Date Noted  . Screening mammogram, encounter for 01/25/2019  . Knee pain, left 07/16/2017  . Mitral valve prolapse   . Encounter for Medicare annual wellness exam 01/12/2016  . Routine general medical examination at a health care facility 01/12/2016  . Estrogen deficiency 01/12/2016  . Breast cancer screening 01/12/2016  . 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 Poor balance 06/06/2015  . Ataxia 06/06/2015  . MI, old 05/22/2015  . Aortic insufficiency   .  Dizziness 11/09/2013  . S/P excision of acoustic neuroma 11/09/2013  . Swelling of right ankle joint 06/28/2013  . Sprain of right knee 06/28/2013  . Sciatica of left side 05/21/2013  . Hip pain, bilateral 05/27/2012  . Other screening mammogram 05/27/2012  . ANKLE PAIN, RIGHT 11/16/2007  . FRACTURE, TIBIA 11/16/2007  . HIP PAIN 04/29/2007  . Osteoporosis 04/29/2007  . HYPERCHOLESTEROLEMIA 04/28/2007  . DEPRESSION 04/28/2007  . DECREASED HEARING, LEFT EAR 04/28/2007  . Essential hypertension 04/28/2007  . Mitral valve disorder 04/28/2007   Past Medical History:  Diagnosis Date  . Anemia   . Aortic insufficiency    mild by echo 12/2014   . Arthritis   . Chicken pox   . Heart attack (HCC) 2006   nontransmural MI seconday to vasispasm w normal coronary arteries  . High cholesterol   . Ischemic dilated cardiomyopathy (HCC)    resolved  . MI, old 05/22/2015  . Mitral valve prolapse    with mild MR by echo 10/2015  . White coat hypertension    Past Surgical History:  Procedure Laterality Date  . acoustic neuroma   1991  . ANKLE SURGERY  2009  .  CHOLECYSTECTOMY  1990  . facial plastic surgery  1996  . TONSILLECTOMY AND ADENOIDECTOMY  1946   Social History   Tobacco Use  . Smoking status: Never Smoker  . Smokeless tobacco: Never Used  Substance Use Topics  . Alcohol use: Yes    Alcohol/week: 0.0 standard drinks    Comment: occ  . Drug use: No   Family History  Problem Relation Age of Onset  . Hypertension Father   . COPD Father   . Hypertension Sister    Allergies  Allergen Reactions  . Ace Inhibitors Cough  . Fosamax [Alendronate Sodium]     Dental problems  . Statins     Muscle pain    Current Outpatient Medications on File Prior to Visit  Medication Sig Dispense Refill  . aspirin 81 MG tablet Take 81 mg by mouth daily.    . Calcium Carbonate-Vitamin D (CALCIUM-VITAMIN D) 500-200 MG-UNIT per tablet Take 1 tablet by mouth daily.     . carvedilol (COREG)  6.25 MG tablet Take 1 tablet (6.25 mg total) by mouth 2 (two) times daily. 180 tablet 3  . diclofenac sodium (VOLTAREN) 1 % GEL Apply 2 g topically 4 (four) times daily. To painful knee 100 g 3  . losartan (COZAAR) 50 MG tablet Take 1 tablet (50 mg total) by mouth daily. Please keep upcoming appt with Dr. Mayford Knife for March for future refills. Thank you 90 tablet 0  . naproxen sodium (ANAPROX) 220 MG tablet Take 220 mg by mouth as needed.      No current facility-administered medications on file prior to visit.     Review of Systems  Constitutional: Negative for activity change, appetite change, fatigue, fever and unexpected weight change.  HENT: Negative for congestion, ear pain, rhinorrhea, sinus pressure and sore throat.   Eyes: Negative for pain, redness and visual disturbance.  Respiratory: Negative for cough, shortness of breath and wheezing.   Cardiovascular: Negative for chest pain and palpitations.  Gastrointestinal: Negative for abdominal pain, blood in stool, constipation and diarrhea.  Endocrine: Negative for polydipsia and polyuria.  Genitourinary: Negative for dysuria, frequency and urgency.  Musculoskeletal: Positive for back pain. Negative for arthralgias and myalgias.  Skin: Negative for pallor and rash.  Allergic/Immunologic: Negative for environmental allergies.  Neurological: Negative for dizziness, syncope and headaches.  Hematological: Negative for adenopathy. Does not bruise/bleed easily.  Psychiatric/Behavioral: Negative for decreased concentration and dysphoric mood. The patient is not nervous/anxious.        Objective:   Physical Exam Constitutional:      General: She is not in acute distress.    Appearance: Normal appearance. She is well-developed and normal weight. She is not ill-appearing.  HENT:     Head: Normocephalic and atraumatic.     Mouth/Throat:     Mouth: Mucous membranes are moist.     Pharynx: Oropharynx is clear.  Eyes:      Conjunctiva/sclera: Conjunctivae normal.     Pupils: Pupils are equal, round, and reactive to light.  Neck:     Musculoskeletal: Normal range of motion and neck supple.     Thyroid: No thyromegaly.     Vascular: No carotid bruit or JVD.  Cardiovascular:     Rate and Rhythm: Normal rate and regular rhythm.     Heart sounds: Normal heart sounds. No gallop.   Pulmonary:     Effort: Pulmonary effort is normal. No respiratory distress.     Breath sounds: Normal breath sounds. No wheezing or  rales.  Abdominal:     General: Abdomen is flat. There is no distension or abdominal bruit.  Musculoskeletal:     Right lower leg: No edema.     Left lower leg: No edema.     Comments: Kyphosis noted -baseline /per pt has worsened  Lymphadenopathy:     Cervical: No cervical adenopathy.  Skin:    General: Skin is warm and dry.     Findings: No rash.  Neurological:     Mental Status: She is alert. Mental status is at baseline.     Deep Tendon Reflexes: Reflexes are normal and symmetric.  Psychiatric:        Mood and Affect: Mood normal.           Assessment & Plan:   Problem List Items Addressed This Visit      Cardiovascular and Mediastinum   Essential hypertension    bp in fair control at this time  BP Readings from Last 1 Encounters:  01/25/19 138/78   No changes needed Most recent labs reviewed  Disc lifstyle change with low sodium diet and exercise  For f/u with cardiology soon         Musculoskeletal and Integument   Osteoporosis - Primary    Ordered dexa  In the past pt had issues with "crumbling teeth" on bisphosphonate and had to stop  ? If side eff to evista-wants to try again  Handout given  Also handout on Prolia (unsure if ins would cover)  On ca and D D level drawn today        Relevant Medications   raloxifene (EVISTA) 60 MG tablet   Other Relevant Orders   VITAMIN D 25 Hydroxy (Vit-D Deficiency, Fractures)     Other   HYPERCHOLESTEROLEMIA    Disc  goals for lipids and reasons to control them Rev last labs with pt Rev low sat fat diet in detail Labs drawn today  Pt pt diet is good      Relevant Orders   Comprehensive metabolic panel   Lipid panel   Estrogen deficiency   Relevant Orders   DG Bone Density   Screening mammogram, encounter for    Ref for annual mammogram       Relevant Orders   MM 3D SCREEN BREAST BILATERAL

## 2019-01-25 NOTE — Patient Instructions (Signed)
Labs for vitamin D and chemistries and cholesterol today   I will send in evista to try again   Take care of yourself   Stop at check out to schedule dexa and mammogram

## 2019-01-25 NOTE — Assessment & Plan Note (Signed)
Ordered dexa  In the past pt had issues with "crumbling teeth" on bisphosphonate and had to stop  ? If side eff to evista-wants to try again  Handout given  Also handout on Prolia (unsure if ins would cover)  On ca and D D level drawn today

## 2019-01-25 NOTE — Assessment & Plan Note (Signed)
Disc goals for lipids and reasons to control them Rev last labs with pt Rev low sat fat diet in detail Labs drawn today  Pt pt diet is good

## 2019-01-26 ENCOUNTER — Telehealth: Payer: Self-pay | Admitting: *Deleted

## 2019-01-26 NOTE — Telephone Encounter (Signed)
Left VM requesting pt to call the office back regarding lab results that were released on mychart

## 2019-01-27 ENCOUNTER — Ambulatory Visit: Payer: Medicare Other | Admitting: Cardiology

## 2019-01-27 ENCOUNTER — Encounter: Payer: Self-pay | Admitting: Cardiology

## 2019-01-27 VITALS — BP 154/78 | HR 64 | Ht 67.0 in | Wt 163.1 lb

## 2019-01-27 DIAGNOSIS — Z8679 Personal history of other diseases of the circulatory system: Secondary | ICD-10-CM | POA: Diagnosis not present

## 2019-01-27 DIAGNOSIS — I341 Nonrheumatic mitral (valve) prolapse: Secondary | ICD-10-CM

## 2019-01-27 DIAGNOSIS — I10 Essential (primary) hypertension: Secondary | ICD-10-CM | POA: Diagnosis not present

## 2019-01-27 DIAGNOSIS — R0609 Other forms of dyspnea: Secondary | ICD-10-CM

## 2019-01-27 NOTE — Patient Instructions (Signed)
Medication Instructions:  Your physician recommends that you continue on your current medications as directed. Please refer to the Current Medication list given to you today.  If you need a refill on your cardiac medications before your next appointment, please call your pharmacy.   Lab work: None If you have labs (blood work) drawn today and your tests are completely normal, you will receive your results only by: Marland Kitchen MyChart Message (if you have MyChart) OR . A paper copy in the mail If you have any lab test that is abnormal or we need to change your treatment, we will call you to review the results.  Testing/Procedures: Your physician has requested that you have a lexiscan myoview. For further information please visit [email or web address] Please follow instruction sheet, as given.  Follow-Up: At [facility], you and your health needs are our priority.  As part of our continuing mission to provide you with exceptional heart care, we have created designated Provider Care Teams.  These Care Teams include your primary Cardiologist (physician) and Advanced Practice Providers (APPs -  Physician Assistants and Nurse Practitioners) who all work together to provide you with the care you need, when you need it. You will need a follow up appointment in 1 years.  Please call our office 2 months in advance to schedule this appointment.  You may see Dr. Mayford Knife or one of the following Advanced Practice Providers on your designated Care Team:   [place], PA-C Ronie Spies, PA-C . Jacolyn Reedy, PA-C   Check your blood pressure for 1 week, in the morning 1-2 hours after medication and call our office with the results.

## 2019-01-27 NOTE — Progress Notes (Signed)
Cardiology Office Note:    Date:  01/27/2019   ID:  Latoya Parrish, DOB [birth year], MRN [id number]  PCP:  Judy Pimple, MD  Cardiologist:  No primary care provider on file.    Referring MD: Judy Pimple, MD   Chief Complaint  Patient presents with  . Follow-up    coronary vasospasm    History of Present Illness:    Latoya Parrish is a 78 y.o. female with a hx of NSTEMI secondary to coronary artery vasospasm with normal coronary arteries at cath, HTN and dyslipidemia.  She is here today for followup and is doing well.  She denies any chest pain or pressure, PND, orthopnea, LE edema, dizziness, palpitations or syncope. She has noticed DOE over the past 6 months and  affects her ADLs to some degree.  She says that she will get very tired and have to sit down.  She thinks it is related to aging and her arthritis. She is compliant with her meds and is tolerating meds with no SE.    Past Medical History:  Diagnosis Date  . Anemia   . Aortic insufficiency    mild by echo 12/2014   . Arthritis   . Chicken pox   . Heart attack (HCC) 2006   nontransmural MI seconday to vasispasm w normal coronary arteries  . High cholesterol   . Ischemic dilated cardiomyopathy (HCC)    resolved  . MI, old 05/22/2015  . Mitral valve prolapse    with mild MR by echo 10/2015  . White coat hypertension     Past Surgical History:  Procedure Laterality Date  . acoustic neuroma   1991  . ANKLE SURGERY  2009  . CHOLECYSTECTOMY  1990  . facial plastic surgery  1996  . TONSILLECTOMY AND ADENOIDECTOMY  1946    Current Medications: No outpatient medications have been marked as taking for the 01/27/19 encounter (Office Visit) with Quintella Reichert, MD.     Allergies:   Ace inhibitors; Fosamax [alendronate sodium]; and Statins   Social History   Socioeconomic History  . Marital status: Married    Spouse name: Not on file  . Number of children: Not on file  . Years of education: Not on file  .  Highest education level: Not on file  Occupational History  . Occupation: Runner, broadcasting/film/video  Social Needs  . Financial resource strain: Not on file  . Food insecurity:    Worry: Not on file    Inability: Not on file  . Transportation needs:    Medical: Not on file    Non-medical: Not on file  Tobacco Use  . Smoking status: Never Smoker  . Smokeless tobacco: Never Used  Substance and Sexual Activity  . Alcohol use: Yes    Alcohol/week: 0.0 standard drinks    Comment: occ  . Drug use: No  . Sexual activity: Not on file  Lifestyle  . Physical activity:    Days per week: Not on file    Minutes per session: Not on file  . Stress: Not on file  Relationships  . Social connections:    Talks on phone: Not on file    Gets together: Not on file    Attends religious service: Not on file    Active member of club or organization: Not on file    Attends meetings of clubs or organizations: Not on file    Relationship status: Not on file  Other Topics Concern  .  Not on file  Social History Narrative   Widowed in 2012    Working part time     Family History: The patient's family history includes COPD in her father; Hypertension in her father and sister.  ROS:   Please see the history of present illness.    ROS  All other systems reviewed and negative.   EKGs/Labs/Other Studies Reviewed:    The following studies were reviewed today: none  EKG:  EKG is  ordered today.  The ekg ordered today demonstrates NSR at 64bpm with no ST changes  Recent Labs: 01/25/2019: ALT 14; BUN 16; Creatinine, Ser 1.00; Potassium 4.5; Sodium 138   Recent Lipid Panel    Component Value Date/Time   CHOL 231 (H) 01/25/2019 1056   TRIG 259.0 (H) 01/25/2019 1056   HDL 48.50 01/25/2019 1056   CHOLHDL 5 01/25/2019 1056   VLDL 51.8 (H) 01/25/2019 1056   LDLCALC 121 (H) 01/04/2016 0907   LDLDIRECT 145.0 01/25/2019 1056    Physical Exam:    VS:  BP (!) 154/78   Pulse 64   Ht 5\' 7"  (1.702 m)   Wt 163 lb 1.9  oz (74 kg)   BMI 25.55 kg/m     Wt Readings from Last 3 Encounters:  01/27/19 163 lb 1.9 oz (74 kg)  01/25/19 161 lb 6 oz (73.2 kg)  12/18/17 165 lb 3.2 oz (74.9 kg)     GEN:  Well nourished, well developed in no acute distress HEENT: Normal NECK: No JVD; No carotid bruits LYMPHATICS: No lymphadenopathy CARDIAC: RRR, no murmurs, rubs, gallops RESPIRATORY:  Clear to auscultation without rales, wheezing or rhonchi  ABDOMEN: Soft, non-tender, non-distended MUSCULOSKELETAL:  No edema; No deformity  SKIN: Warm and dry NEUROLOGIC:  Alert and oriented x 3 PSYCHIATRIC:  Normal affect   ASSESSMENT:    1. Mitral valve prolapse   2. Essential hypertension   3. History of coronary vasospasm    PLAN:    In order of problems listed above:  1  MVP - no evidence of MVP or MR on last echo 2019.  2.  HTN - BP is borderline controlled on exam today.  She will continue on Carvedilol 6.25mg  BID and Losartan 50mg  daily.  I have asked her to check her BP daily for a week and call with results.   3.  Hx of coronary vasospasm - she denies any chest pain  since I saw her last and no CP at rest.  She has had progressive DOE to the point it is affecting her ADLs.  She thinks it is related to her underlying arthritis and age.  She will continue on carvedilol 6.25mg  BID.  I will get a stress myoview to assess.   4.  Calcified AV - no evidence of AS with only mild AR on echo a year ago.   Medication Adjustments/Labs and Tests Ordered: Current medicines are reviewed at length with the patient today.  Concerns regarding medicines are outlined above.  No orders of the defined types were placed in this encounter.  No orders of the defined types were placed in this encounter.   Signed, Armanda Magic, MD  01/27/2019 8:54 AM    [facility]

## 2019-01-28 ENCOUNTER — Telehealth (HOSPITAL_COMMUNITY): Payer: Self-pay | Admitting: *Deleted

## 2019-01-28 NOTE — Telephone Encounter (Signed)
Left message on voicemail per DPR in reference to upcoming appointment scheduled on 02/02/19 with detailed instructions given per Myocardial Perfusion Study Information Sheet for the test. LM to arrive 15 minutes early, and that it is imperative to arrive on time for appointment to keep from having the test rescheduled. If you need to cancel or reschedule your appointment, please call the office within 24 hours of your appointment. Failure to do so may result in a cancellation of your appointment, and a $50 no show fee. Phone number given for call back for any questions. Ricky Ala

## 2019-02-02 ENCOUNTER — Ambulatory Visit (HOSPITAL_COMMUNITY): Payer: Medicare Other | Attending: Cardiovascular Disease

## 2019-02-02 DIAGNOSIS — R0609 Other forms of dyspnea: Secondary | ICD-10-CM | POA: Diagnosis not present

## 2019-02-02 LAB — MYOCARDIAL PERFUSION IMAGING
LV dias vol: 57 mL (ref 46–106)
LV sys vol: 17 mL
Peak HR: 110 {beats}/min
Rest HR: 71 {beats}/min
SDS: 4
SRS: 0
SSS: 4
TID: 1.08

## 2019-02-02 MED ORDER — REGADENOSON 0.4 MG/5ML IV SOLN
0.4000 mg | Freq: Once | INTRAVENOUS | Status: AC
  Administered 2019-02-02: 0.4 mg via INTRAVENOUS

## 2019-02-02 MED ORDER — TECHNETIUM TC 99M TETROFOSMIN IV KIT
31.1000 | PACK | Freq: Once | INTRAVENOUS | Status: AC | PRN
  Administered 2019-02-02: 31.1 via INTRAVENOUS
  Filled 2019-02-02: qty 32

## 2019-02-02 MED ORDER — TECHNETIUM TC 99M TETROFOSMIN IV KIT
9.6000 | PACK | Freq: Once | INTRAVENOUS | Status: AC | PRN
  Administered 2019-02-02: 9.6 via INTRAVENOUS
  Filled 2019-02-02: qty 10

## 2019-02-03 ENCOUNTER — Telehealth: Payer: Self-pay | Admitting: *Deleted

## 2019-02-03 NOTE — Telephone Encounter (Signed)
Patient notified of result.  Please refer to phone note from today for complete details.  Pt thanked me for going over her Myoview results.  Danielle Rankin, [facility] 02/03/2019 4:37 PM   Pt did ask me however if Dr. Mendel Corning received lab work that was done with PCP Dr. Milinda Antis. I assured the pt that labs are in the computer. I am not sure if Dr. Mayford Knife has seen them or not. Pt would like for Dr. Mayford Knife to look at lipids as she feels this is high and would like to know if Dr. Mayford Knife has any recommendations. I assured the pt that I will send note to Dr. Mayford Knife and if she has any recommendations her nurse Romeo Apple will call back. Pt thanked me for the help.

## 2019-02-07 NOTE — Telephone Encounter (Signed)
Lipids are too high - recommend that she discuss treatment options with her PCP but needs to be on statin if she can tolerate

## 2019-02-08 NOTE — Telephone Encounter (Signed)
Addressed in result notes  

## 2019-02-09 NOTE — Telephone Encounter (Signed)
Called pt with Dr. Malachy Mood recommendation regarding statin. Will froward labs to PCP.   Pt verbalized understanding.

## 2019-02-09 NOTE — Telephone Encounter (Signed)
If she would be open to re trying a statin (she has muscle pain in the past)- I would go with low dose crestor at 5 mg daily or every other day Let me know what she thinks

## 2019-02-10 NOTE — Telephone Encounter (Signed)
Left message on voicemail for patient to call back. 

## 2019-02-15 NOTE — Telephone Encounter (Signed)
Pt said she wants to decline starting a statin right now she wants to really work on her diet especially since we are quarantined she is at home and can work really hard on her diet

## 2019-02-15 NOTE — Telephone Encounter (Signed)
I'm fine with that - please repeat lipids in 4 months

## 2019-02-15 NOTE — Telephone Encounter (Signed)
Aware Will cc Dr Mayford Knife

## 2019-03-10 ENCOUNTER — Other Ambulatory Visit: Payer: Self-pay | Admitting: Cardiology

## 2019-03-10 DIAGNOSIS — Z853 Personal history of malignant neoplasm of breast: Secondary | ICD-10-CM | POA: Diagnosis not present

## 2019-03-10 DIAGNOSIS — Z9012 Acquired absence of left breast and nipple: Secondary | ICD-10-CM | POA: Diagnosis not present

## 2019-03-10 MED ORDER — LOSARTAN POTASSIUM 50 MG PO TABS: 50.0000 mg | ORAL_TABLET | Freq: Every day | ORAL | 3 refills | Status: DC

## 2019-03-12 ENCOUNTER — Other Ambulatory Visit: Payer: Self-pay

## 2019-03-12 ENCOUNTER — Ambulatory Visit: Payer: Self-pay

## 2019-05-13 DIAGNOSIS — H04122 Dry eye syndrome of left lacrimal gland: Secondary | ICD-10-CM | POA: Diagnosis not present

## 2019-07-15 ENCOUNTER — Emergency Department (HOSPITAL_COMMUNITY)
Admission: EM | Admit: 2019-07-15 | Discharge: 2019-07-16 | Disposition: A | Discharge status: Home / Self Care | Payer: Medicare Other | Attending: Emergency Medicine | Admitting: Emergency Medicine

## 2019-07-15 ENCOUNTER — Other Ambulatory Visit: Payer: Self-pay

## 2019-07-15 ENCOUNTER — Emergency Department (HOSPITAL_COMMUNITY): Payer: Medicare Other

## 2019-07-15 DIAGNOSIS — Z7982 Long term (current) use of aspirin: Secondary | ICD-10-CM | POA: Insufficient documentation

## 2019-07-15 DIAGNOSIS — I4891 Unspecified atrial fibrillation: Secondary | ICD-10-CM | POA: Diagnosis not present

## 2019-07-15 DIAGNOSIS — R002 Palpitations: Secondary | ICD-10-CM | POA: Diagnosis present

## 2019-07-15 DIAGNOSIS — I10 Essential (primary) hypertension: Secondary | ICD-10-CM | POA: Insufficient documentation

## 2019-07-15 DIAGNOSIS — Z79899 Other long term (current) drug therapy: Secondary | ICD-10-CM | POA: Insufficient documentation

## 2019-07-15 DIAGNOSIS — I471 Supraventricular tachycardia: Secondary | ICD-10-CM | POA: Diagnosis not present

## 2019-07-15 DIAGNOSIS — R0602 Shortness of breath: Secondary | ICD-10-CM | POA: Diagnosis not present

## 2019-07-15 DIAGNOSIS — R000 Tachycardia, unspecified: Secondary | ICD-10-CM | POA: Diagnosis not present

## 2019-07-15 DIAGNOSIS — R0989 Other specified symptoms and signs involving the circulatory and respiratory systems: Secondary | ICD-10-CM | POA: Diagnosis not present

## 2019-07-15 DIAGNOSIS — I499 Cardiac arrhythmia, unspecified: Secondary | ICD-10-CM | POA: Diagnosis not present

## 2019-07-15 DIAGNOSIS — I480 Paroxysmal atrial fibrillation: Secondary | ICD-10-CM | POA: Insufficient documentation

## 2019-07-15 MED ORDER — APIXABAN 5 MG PO TABS
5.0000 mg | ORAL_TABLET | Freq: Two times a day (BID) | ORAL | Status: DC
  Administered 2019-07-16: 5 mg via ORAL
  Filled 2019-07-15: qty 1

## 2019-07-15 NOTE — ED Triage Notes (Signed)
Patient c/o being outside and began to have a "heart problem". States that it felt like her heart had grown "3 sizes". Per EMS upon arrival, pt was in SVT and was administered 5mg  Metoprolol and 627ml NS bolus.

## 2019-07-15 NOTE — ED Provider Notes (Signed)
[facility] Provider Note   CSN: [id number] Arrival date & time: 07/15/19  2325     History   Chief Complaint Chief Complaint  Patient presents with  . Palpitations    HPI Latoya Parrish is a 78 y.o. female.     The history is provided by the patient.  Palpitations Palpitations quality:  Irregular Onset quality:  Sudden Duration:  5 hours Timing:  Constant Progression:  Unchanged Chronicity:  New Relieved by:  Nothing Worsened by:  Nothing Associated symptoms: dizziness and shortness of breath   Associated symptoms: no chest pain, no cough, no syncope and no vomiting    Patient with history of hyperlipidemia, hypertension, mitral valve prolapse, coronary vasospasm, reports onset of palpitations, fast heart rate and shortness of breath and dizziness about 5 hours ago She was at her mailbox when it started.  At first she thought it was similar to prior heart conditions.  No active chest pain.  She was otherwise well. Denies known history of atrial fibrillation  Past Medical History:  Diagnosis Date  . Anemia   . Aortic insufficiency    mild by echo 12/2014   . Arthritis   . Chicken pox   . Heart attack (HCC) 2006   nontransmural MI seconday to vasispasm w normal coronary arteries  . High cholesterol   . Ischemic dilated cardiomyopathy (HCC)    resolved  . MI, old 05/22/2015  . Mitral valve prolapse    with mild MR by echo 10/2015  . White coat hypertension     Patient Active Problem List   Diagnosis Date Noted  . Screening mammogram, encounter for 01/25/2019  . Knee pain, left 07/16/2017  . Mitral valve prolapse   . Encounter for Medicare annual wellness exam 01/12/2016  . Routine general medical examination at a health care facility 01/12/2016  . Estrogen deficiency 01/12/2016  . Breast cancer screening 01/12/2016  . 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 Poor balance 06/06/2015  . Ataxia 06/06/2015  . MI, old  05/22/2015  . Aortic insufficiency   . Dizziness 11/09/2013  . S/P excision of acoustic neuroma 11/09/2013  . Swelling of right ankle joint 06/28/2013  . Sprain of right knee 06/28/2013  . Sciatica of left side 05/21/2013  . Hip pain, bilateral 05/27/2012  . Other screening mammogram 05/27/2012  . ANKLE PAIN, RIGHT 11/16/2007  . FRACTURE, TIBIA 11/16/2007  . HIP PAIN 04/29/2007  . Osteoporosis 04/29/2007  . HYPERCHOLESTEROLEMIA 04/28/2007  . DEPRESSION 04/28/2007  . DECREASED HEARING, LEFT EAR 04/28/2007  . Essential hypertension 04/28/2007  . Mitral valve disorder 04/28/2007    Past Surgical History:  Procedure Laterality Date  . acoustic neuroma   1991  . ANKLE SURGERY  2009  . CHOLECYSTECTOMY  1990  . facial plastic surgery  1996  . TONSILLECTOMY AND ADENOIDECTOMY  1946     OB History   No obstetric history on file.      Home Medications    Prior to Admission medications   Medication Sig Start Date End Date Taking? Authorizing Provider  aspirin 81 MG tablet Take 81 mg by mouth daily.    [provider]  Calcium Carbonate-Vitamin D (CALCIUM-VITAMIN D) 500-200 MG-UNIT per tablet Take 1 tablet by mouth daily.     [provider]  carvedilol (COREG) 6.25 MG tablet TAKE 1 TABLET BY MOUTH 2 TIMES DAILY. 03/10/19   Quintella Reicherturner, Traci R, MD  diclofenac sodium (VOLTAREN) 1 % GEL Apply 2  g topically 4 (four) times daily. To painful knee 07/16/17   Tower, Roque Lias A, MD  losartan (COZAAR) 50 MG tablet Take 1 tablet (50 mg total) by mouth daily. 03/10/19   Sueanne Margarita, MD  naproxen sodium (ANAPROX) 220 MG tablet Take 220 mg by mouth as needed.     [provider]  raloxifene (EVISTA) 60 MG tablet Take 1 tablet (60 mg total) by mouth daily. 01/25/19   Tower, Wynelle Fanny, MD    Family History Family History  Problem Relation Age of Onset  . Hypertension Father   . COPD Father   . Hypertension Sister     Social History Social History   Tobacco Use  .  Smoking status: Never Smoker  . Smokeless tobacco: Never Used  Substance Use Topics  . Alcohol use: Yes    Alcohol/week: 0.0 standard drinks    Comment: occ  . Drug use: No     Allergies   Ace inhibitors, Fosamax [alendronate sodium], and Statins   Review of Systems Review of Systems  Constitutional: Negative for fever.  Respiratory: Positive for shortness of breath. Negative for cough.   Cardiovascular: Positive for palpitations. Negative for chest pain and syncope.  Gastrointestinal: Negative for vomiting.  Neurological: Positive for dizziness. Negative for syncope.  All other systems reviewed and are negative.    Physical Exam Updated Vital Signs BP (!) 138/52   Pulse 62   Temp (!) 97.4 F (36.3 C) (Oral)   Resp 20   Ht 1.74 m (5' 8.5")   Wt 77.1 kg   SpO2 100%   BMI 25.47 kg/m   Physical Exam CONSTITUTIONAL: Elderly, no acute distress HEAD: Normocephalic/atraumatic EYES: EOMI/PERRL ENMT: Mucous membranes moist NECK: supple no meningeal signs SPINE/BACK:entire spine nontender CV: Tachycardic, irregular LUNGS: Lungs are clear to auscultation bilaterally, no apparent distress ABDOMEN: soft, nontender, no rebound or guarding, bowel sounds noted throughout abdomen GU:no cva tenderness NEURO: Pt is awake/alert/appropriate, moves all extremitiesx4.  No facial droop.   EXTREMITIES: pulses normal/equal, full ROM SKIN: warm, color normal PSYCH: no abnormalities of mood noted, alert and oriented to situation   ED Treatments / Results  Labs (all labs ordered are listed, but only abnormal results are displayed) Labs Reviewed  BASIC METABOLIC PANEL - Abnormal; Notable for the following components:      Result Value   CO2 21 (*)    Glucose, Bld 164 (*)    Calcium 8.3 (*)    GFR calc non Af Amer 58 (*)    All other components within normal limits  CBC - Abnormal; Notable for the following components:   Hemoglobin 11.4 (*)    HCT 35.9 (*)    All other  components within normal limits  TROPONIN I (HIGH SENSITIVITY) - Abnormal; Notable for the following components:   Troponin I (High Sensitivity) 40 (*)    All other components within normal limits  TROPONIN I (HIGH SENSITIVITY) - Abnormal; Notable for the following components:   Troponin I (High Sensitivity) 98 (*)    All other components within normal limits  MAGNESIUM    EKG EKG Interpretation  Date/Time:  Thursday July 15 2019 23:33:08 EDT Ventricular Rate:  125 PR Interval:    QRS Duration: 79 QT Interval:  323 QTC Calculation: 466 R Axis:   52 Text Interpretation:  Atrial fibrillation Anteroseptal infarct, old Borderline repol abnormality, diffuse leads changed from prior Abnormal ekg Confirmed by Ripley Fraise [phone]) on 07/15/2019 11:37:49 PM   Radiology Dg  Chest Port 1 View  Result Date: 07/15/2019 CLINICAL DATA:  Shortness of breath EXAM: PORTABLE CHEST 1 VIEW COMPARISON:  November 17, 2007 FINDINGS: Heart size is mildly enlarged. There are aortic calcifications. There are small airspace opacities at the lung bases bilaterally. There is no pneumothorax. There is no acute osseous abnormality. There are degenerative changes of both shoulders. There is a calcified granuloma in the left upper lobe. IMPRESSION: Bibasilar airspace opacities which may represent atelectasis or pneumonia. Electronically Signed   By: Katherine Mantlehristopher  Green M.D.   On: 07/15/2019 23:53    Procedures Procedures   Medications Ordered in ED Medications  apixaban (ELIQUIS) tablet 5 mg (5 mg Oral Given 07/16/19 0015)     Initial Impression / Assessment and Plan / ED Course  I have reviewed the triage vital signs and the nursing notes.  Pertinent labs & imaging results that were available during my care of the patient were reviewed by me and considered in my medical decision making (see chart for details).        12:26 AM Patient presented with new onset atrial fibrillation. However after some  time in the ER, she converted spontaneously. Back in sinus rhythm.  She will be placed on anticoagulants and referred to atrial fibrillation clinic.  X-ray shows questionable infiltrate, but denies any cough.  EKG Interpretation  Date/Time:  Thursday July 15 2019 23:49:11 EDT Ventricular Rate:  77 PR Interval:    QRS Duration: 77 QT Interval:  412 QTC Calculation: 467 R Axis:   36 Text Interpretation:  Sinus rhythm Probable anteroseptal infarct, recent afib has resolved Confirmed by Zadie RhineWickline, [place] ([id number]) on 07/16/2019 12:23:31 AM     \      This patients CHA2DS2-VASc Score and unadjusted Ischemic Stroke Rate (% per year) is equal to 4.8 % stroke rate/year from a score of 4  Above score calculated as 1 point each if present [CHF, HTN, DM, Vascular=MI/PAD/Aortic Plaque, Age if 65-74, or Female] Above score calculated as 2 points each if present [Age > 75, or Stroke/TIA/TE]   3:18 AM Patient remains in sinus rhythm. No chest pain.  Troponin did elevate, but this is likely due to tachyarrhythmia for several hours.  Low suspicion for ACS this time. Patient will be started on Eliquis and referral to A. fib clinic.  We discussed strict return precautions. Advised on bleeding precautions while on Eliquis Final Clinical Impressions(s) / ED Diagnoses   Final diagnoses:  Paroxysmal atrial fibrillation [facility](HCC)    ED Discharge Orders         Ordered    apixaban (ELIQUIS) 5 MG TABS tablet  2 times daily     07/16/19 0301    Amb referral to AFIB Clinic     07/15/19 2336           Zadie RhineWickline, Deb Loudin, MD 07/16/19 0320

## 2019-07-15 NOTE — Progress Notes (Signed)
ANTICOAGULATION CONSULT NOTE - Initial Consult  Pharmacy Consult for Eliquis Indication: atrial fibrillation   Patient Measurements: Height: 5' 8.5" (174 cm) Weight: 170 lb (77.1 kg) IBW/kg (Calculated) : 65.05   Labs: No results for input(s): HGB, HCT, PLT, APTT, LABPROT, INR, HEPARINUNFRC, HEPRLOWMOCWT, CREATININE, CKTOTAL, CKMB, TROPONINIHS in the last 72 hours.  CrCl cannot be calculated (Patient's most recent lab result is older than the maximum 21 days allowed.).   Medical History: Past Medical History:  Diagnosis Date  . Anemia   . Aortic insufficiency    mild by echo 12/2014   . Arthritis   . Chicken pox   . Heart attack ([place]) 2006   nontransmural MI seconday to vasispasm w normal coronary arteries  . High cholesterol   . Ischemic dilated cardiomyopathy ([place])    resolved  . MI, old 05/22/2015  . Mitral valve prolapse    with mild MR by echo 10/2015  . White coat hypertension     Assessment: 78 yo female with acute onset of dizziness and SOB. Found to be in AFib. She is not on anticoagulation prior to admission. She converted to normal sinus rhythm in the ED. Plan to initiate Eliquis. SCr ip.  SCr 0.9  Goal of Therapy:  Monitor platelets by anticoagulation protocol: Yes   Plan:  -Elqiuis 5 mg po bid -Eliquis education completed with the patient   Harvel Quale 07/15/2019,11:43 PM

## 2019-07-16 ENCOUNTER — Telehealth: Payer: Self-pay | Admitting: Cardiology

## 2019-07-16 LAB — BASIC METABOLIC PANEL
Anion gap: 9 (ref 5–15)
BUN: 16 mg/dL (ref 8–23)
CO2: 21 mmol/L — ABNORMAL LOW (ref 22–32)
Calcium: 8.3 mg/dL — ABNORMAL LOW (ref 8.9–10.3)
Chloride: 107 mmol/L (ref 98–111)
Creatinine, Ser: 0.94 mg/dL (ref 0.44–1.00)
GFR calc Af Amer: >60 mL/min (ref >60)
GFR calc non Af Amer: 58 mL/min — ABNORMAL LOW (ref >60)
Glucose, Bld: 164 mg/dL — ABNORMAL HIGH (ref 70–99)
Potassium: 4 mmol/L (ref 3.5–5.1)
Sodium: 137 mmol/L (ref 135–145)

## 2019-07-16 LAB — CBC
HCT: 35.9 % — ABNORMAL LOW (ref 36.0–46.0)
Hemoglobin: 11.4 g/dL — ABNORMAL LOW (ref 12.0–15.0)
MCH: 28.1 pg (ref 26.0–34.0)
MCHC: 31.8 g/dL (ref 30.0–36.0)
MCV: 88.6 fL (ref 80.0–100.0)
Platelets: 215 10*3/uL (ref 150–400)
RBC: 4.05 MIL/uL (ref 3.87–5.11)
RDW: 13.4 % (ref 11.5–15.5)
WBC: 10.2 10*3/uL (ref 4.0–10.5)
nRBC: 0 % (ref 0.0–0.2)

## 2019-07-16 LAB — MAGNESIUM: Magnesium: 1.9 mg/dL (ref 1.7–2.4)

## 2019-07-16 LAB — TROPONIN I (HIGH SENSITIVITY)
Troponin I (High Sensitivity): 40 ng/L — ABNORMAL HIGH (ref <18)
Troponin I (High Sensitivity): 98 ng/L — ABNORMAL HIGH (ref <18)

## 2019-07-16 MED ORDER — APIXABAN 5 MG PO TABS: 5.0000 mg | ORAL_TABLET | Freq: Two times a day (BID) | ORAL | 0 refills | Status: DC

## 2019-07-16 NOTE — Telephone Encounter (Signed)
  Patient is calling to make sure Dr Radford Pax knew she went to the ER last night for afib. She is asking that Dr Radford Pax look over her chart and let her know if there is anything she needs to do.

## 2019-07-16 NOTE — Telephone Encounter (Signed)
Pt wanted to make sure that Dr. Radford Pax is aware that she went to the ED last night due to rapid heart rate. She is planning a trip to [place] next week and wants to make sure Dr. Radford Pax is okay with that. Pt states she is feeling much better and is no longer symptomatic.

## 2019-07-17 NOTE — Telephone Encounter (Signed)
She is ok to travel but make sure that she has followup in afib clinic this week

## 2019-07-19 NOTE — Telephone Encounter (Signed)
Spoke with pt, she is aware of appt 07/26/2019.  Pt declined sooner appt as she already has other appts scheduled.

## 2019-07-19 NOTE — Telephone Encounter (Signed)
Pt aware of recommendations and will forward message to afib clinic to call pt and make an appt ./cy

## 2019-07-26 ENCOUNTER — Encounter (HOSPITAL_COMMUNITY): Payer: Self-pay | Admitting: Physician Assistant

## 2019-07-26 ENCOUNTER — Other Ambulatory Visit: Payer: Self-pay

## 2019-07-26 ENCOUNTER — Ambulatory Visit (HOSPITAL_COMMUNITY)
Admission: RE | Admit: 2019-07-26 | Discharge: 2019-07-26 | Disposition: A | Discharge status: Home / Self Care | Payer: Medicare Other | Source: Ambulatory Visit | Attending: Physician Assistant | Admitting: Physician Assistant

## 2019-07-26 VITALS — BP 160/70 | HR 65 | Ht 68.5 in | Wt 165.8 lb

## 2019-07-26 DIAGNOSIS — D649 Anemia, unspecified: Secondary | ICD-10-CM | POA: Insufficient documentation

## 2019-07-26 DIAGNOSIS — Z79899 Other long term (current) drug therapy: Secondary | ICD-10-CM | POA: Insufficient documentation

## 2019-07-26 DIAGNOSIS — Z8249 Family history of ischemic heart disease and other diseases of the circulatory system: Secondary | ICD-10-CM | POA: Insufficient documentation

## 2019-07-26 DIAGNOSIS — I351 Nonrheumatic aortic (valve) insufficiency: Secondary | ICD-10-CM | POA: Diagnosis not present

## 2019-07-26 DIAGNOSIS — I252 Old myocardial infarction: Secondary | ICD-10-CM | POA: Diagnosis not present

## 2019-07-26 DIAGNOSIS — I10 Essential (primary) hypertension: Secondary | ICD-10-CM | POA: Insufficient documentation

## 2019-07-26 DIAGNOSIS — I341 Nonrheumatic mitral (valve) prolapse: Secondary | ICD-10-CM | POA: Insufficient documentation

## 2019-07-26 DIAGNOSIS — E785 Hyperlipidemia, unspecified: Secondary | ICD-10-CM | POA: Diagnosis not present

## 2019-07-26 DIAGNOSIS — I480 Paroxysmal atrial fibrillation: Secondary | ICD-10-CM

## 2019-07-26 DIAGNOSIS — Z7901 Long term (current) use of anticoagulants: Secondary | ICD-10-CM | POA: Diagnosis not present

## 2019-07-26 DIAGNOSIS — Z888 Allergy status to other drugs, medicaments and biological substances status: Secondary | ICD-10-CM | POA: Insufficient documentation

## 2019-07-26 LAB — TSH: TSH: 1.497 u[IU]/mL (ref 0.350–4.500)

## 2019-07-26 MED ORDER — DILTIAZEM HCL 30 MG PO TABS: ORAL_TABLET | ORAL | 1 refills | Status: DC

## 2019-07-26 NOTE — Patient Instructions (Signed)
Cardizem 30mg -- take 1 tablet every 4 hours AS NEEDED for AFIB heart rate >100 as long as top number of blood pressure >100.   ?

## 2019-07-26 NOTE — Progress Notes (Signed)
Primary Care Physician: Tower, Wynelle Fanny, MD Primary Cardiologist: Dr Radford Pax Primary Electrophysiologist: none Referring Physician: [facility]   Latoya Parrish is a 78 y.o. female with a history of NSTEMI secondary to coronary artery vasospasm with normal coronary arteries at cath, HTN, MVP, dyslipidemia, and paroxysmal atrial fibrillation who presents for consultation in the [facility].  The patient was initially diagnosed with atrial fibrillation on 07/15/19 after presenting to the ER with symptoms of palpitations, SOB, and dizziness. During her evaluation in the ER she spontaneously converted to SR and was discharged on Eliquis. She does report she had been out in the heat that day prior to the onset of her symptoms. She denies snoring but does drink some alcohol. She has not had any further episode of palpitations.  Today, she denies symptoms of palpitations, chest pain, shortness of breath, orthopnea, PND, lower extremity edema, dizziness, presyncope, syncope, snoring, daytime somnolence, bleeding, or neurologic sequela. The patient is tolerating medications without difficulties and is otherwise without complaint today.    Atrial Fibrillation Risk Factors:  she does not have symptoms or diagnosis of sleep apnea. she does not have a history of rheumatic fever. she does have a history of alcohol use. The patient does not have a history of early familial atrial fibrillation or other arrhythmias.  she has a BMI of Body mass index is 24.84 kg/m.Marland Kitchen Filed Weights   07/26/19 0932  Weight: 75.2 kg    Family History  Problem Relation Age of Onset  . Hypertension Father   . COPD Father   . Hypertension Sister      Atrial Fibrillation Management history:  Previous antiarrhythmic drugs: none Previous cardioversions: none Previous ablations: none CHADS2VASC score: 5 Anticoagulation history: Eliquis   Past Medical History:  Diagnosis Date  . Anemia    . Aortic insufficiency    mild by echo 12/2014   . Arthritis   . Chicken pox   . Heart attack ([place]) 2006   nontransmural MI seconday to vasispasm w normal coronary arteries  . High cholesterol   . Ischemic dilated cardiomyopathy ([place])    resolved  . MI, old 05/22/2015  . Mitral valve prolapse    with mild MR by echo 10/2015  . White coat hypertension    Past Surgical History:  Procedure Laterality Date  . acoustic neuroma   1991  . ANKLE SURGERY  2009  . CHOLECYSTECTOMY  1990  . facial plastic surgery  1996  . TONSILLECTOMY AND ADENOIDECTOMY  1946    Current Outpatient Medications  Medication Sig Dispense Refill  . apixaban (ELIQUIS) 5 MG TABS tablet Take 1 tablet (5 mg total) by mouth 2 (two) times daily. 60 tablet 0  . Calcium Carbonate-Vitamin D (CALCIUM-VITAMIN D) 500-200 MG-UNIT per tablet Take 1 tablet by mouth daily.     . carvedilol (COREG) 6.25 MG tablet TAKE 1 TABLET BY MOUTH 2 TIMES DAILY. 180 tablet 3  . losartan (COZAAR) 50 MG tablet Take 1 tablet (50 mg total) by mouth daily. 90 tablet 3  . Multiple Vitamin (MULTIVITAMIN WITH MINERALS) TABS tablet Take 1 tablet by mouth daily.    . White Petrolatum-Mineral Oil (LUBRICANT EYE NIGHTTIME) OINT Apply 1 application to eye at bedtime as needed (dry eyes).    Marland Kitchen diltiazem (CARDIZEM) 30 MG tablet Take 1 tablet every 4 hours AS NEEDED for AFIB heart rate >100 45 tablet 1   No current facility-administered medications for this encounter.  Allergies  Allergen Reactions  . Ace Inhibitors Cough  . Fosamax [Alendronate Sodium]     Dental problems  . Statins     Muscle pain     Social History   Socioeconomic History  . Marital status: Widowed    Spouse name: Not on file  . Number of children: Not on file  . Years of education: Not on file  . Highest education level: Not on file  Occupational History  . Occupation: Runner, broadcasting/film/videoteacher  Social Needs  . Financial resource strain: Not on file  . Food insecurity    Worry: Not  on file    Inability: Not on file  . Transportation needs    Medical: Not on file    Non-medical: Not on file  Tobacco Use  . Smoking status: Never Smoker  . Smokeless tobacco: Never Used  Substance and Sexual Activity  . Alcohol use: Yes    Alcohol/week: 0.0 standard drinks    Comment: occ  . Drug use: No  . Sexual activity: Not on file  Lifestyle  . Physical activity    Days per week: Not on file    Minutes per session: Not on file  . Stress: Not on file  Relationships  . Social Musicianconnections    Talks on phone: Not on file    Gets together: Not on file    Attends religious service: Not on file    Active member of club or organization: Not on file    Attends meetings of clubs or organizations: Not on file    Relationship status: Not on file  . Intimate partner violence    Fear of current or ex partner: Not on file    Emotionally abused: Not on file    Physically abused: Not on file    Forced sexual activity: Not on file  Other Topics Concern  . Not on file  Social History Narrative   Widowed in 2012    Working part time     ROS- All systems are reviewed and negative except as per the HPI above.  Physical Exam: Vitals:   07/26/19 0932  BP: (!) 160/70  Pulse: 65  Weight: 75.2 kg  Height: 5' 8.5" (1.74 m)    GEN- The patient is well appearing elderly female, alert and oriented x 3 today.   Head- normocephalic, atraumatic Eyes-  Sclera clear, conjunctiva pink Ears- hearing intact Oropharynx- clear Neck- supple  Lungs- Clear to ausculation bilaterally, normal work of breathing Heart- Regular rate and rhythm, no murmurs, rubs or gallops  GI- soft, NT, ND, + BS Extremities- no clubbing, cyanosis, or edema MS- no significant deformity or atrophy Skin- no rash or lesion Psych- euthymic mood, full affect Neuro- strength and sensation are intact  Wt Readings from Last 3 Encounters:  07/26/19 75.2 kg  07/15/19 77.1 kg  02/02/19 73.9 kg    EKG today  demonstrates SR HR 65, septal infarct, motion artifact, PR 188, QRS 70, QTc 430  Echo 12/04/17 demonstrated  - Left ventricle: The cavity size was normal. There was severe   focal basal and mild concentric hypertrophy. Systolic function   was normal. The estimated ejection fraction was in the range of   60% to 65%. Wall motion was normal; there were no regional wall   motion abnormalities. Left ventricular diastolic function   parameters were normal. - Aortic valve: Severe focal calcification involving the   noncoronary cusp. There was mild regurgitation. - Mitral valve: No echocardiographic evidence for  prolapse. - Left atrium: The atrium was mildly dilated. - Pulmonic valve: There was mild regurgitation. - Pulmonary arteries: PA peak pressure: 32 mm Hg (S).   Epic records are reviewed at length today   Assessment and Plan:  1. Paroxysmal atrial fibrillation General education about afib provided and questions answered.  We also discussed her stroke risk and the risks and benefits of anticoagulation. Continue Eliquis 5 mg BID Check TSH Start diltizem 30 mg PRN for heart racing. We discussed possibility of AAD should afib become more persistent.  Lifestyle changes including reduction of ETOH intake to 2 drinks per week and regular physical activity.  This patients CHA2DS2-VASc Score and unadjusted Ischemic Stroke Rate (% per year) is equal to 7.2 % stroke rate/year from a score of 5  Above score calculated as 1 point each if present [CHF, HTN, DM, Vascular=MI/PAD/Aortic Plaque, Age if 65-74, or Female] Above score calculated as 2 points each if present [Age > 75, or Stroke/TIA/TE]   2. HTN Elevated today. Whitecoat HTN noted in the past. Patient to keep daily BP log for review at follow up. No change today.  5. H/o ischemic CM 2/2 coronary artery vasospasm. No signs or symptoms of fluid overload. Continue present therapy.   6. Focal basal hypertrophy of septum  Severe by  echo 11/2017. Followed by Dr Mayford Knife.   Follow up in AF clinic in one month.   Jorja Loa PA-C Afib Clinic [facility] [address] [place], [place] [address] [phone] 07/26/2019 10:27 AM

## 2019-08-16 ENCOUNTER — Other Ambulatory Visit: Payer: Self-pay

## 2019-08-16 MED ORDER — APIXABAN 5 MG PO TABS: 5.0000 mg | ORAL_TABLET | Freq: Two times a day (BID) | ORAL | 5 refills | Status: DC

## 2019-08-16 NOTE — Telephone Encounter (Signed)
Pt last saw Clint Fenton, PA on 07/26/19, last labs 07/15/19 Creat 0.94, age 78, weight 75.2, based on specified criteria pt is on appropriate dosage of Eliquis 5mg  BID.  Will refill rx.

## 2019-08-26 ENCOUNTER — Ambulatory Visit (HOSPITAL_COMMUNITY)
Admission: RE | Admit: 2019-08-26 | Discharge: 2019-08-26 | Disposition: A | Discharge status: Home / Self Care | Payer: Medicare Other | Source: Ambulatory Visit | Attending: Physician Assistant | Admitting: Physician Assistant

## 2019-08-26 ENCOUNTER — Other Ambulatory Visit: Payer: Self-pay

## 2019-08-26 ENCOUNTER — Encounter (HOSPITAL_COMMUNITY): Payer: Self-pay | Admitting: Physician Assistant

## 2019-08-26 VITALS — BP 142/86 | HR 80 | Ht 68.5 in | Wt 161.8 lb

## 2019-08-26 DIAGNOSIS — Z8249 Family history of ischemic heart disease and other diseases of the circulatory system: Secondary | ICD-10-CM | POA: Insufficient documentation

## 2019-08-26 DIAGNOSIS — I10 Essential (primary) hypertension: Secondary | ICD-10-CM | POA: Diagnosis not present

## 2019-08-26 DIAGNOSIS — I420 Dilated cardiomyopathy: Secondary | ICD-10-CM | POA: Insufficient documentation

## 2019-08-26 DIAGNOSIS — I255 Ischemic cardiomyopathy: Secondary | ICD-10-CM | POA: Insufficient documentation

## 2019-08-26 DIAGNOSIS — Z888 Allergy status to other drugs, medicaments and biological substances status: Secondary | ICD-10-CM | POA: Insufficient documentation

## 2019-08-26 DIAGNOSIS — I739 Peripheral vascular disease, unspecified: Secondary | ICD-10-CM | POA: Insufficient documentation

## 2019-08-26 DIAGNOSIS — E7800 Pure hypercholesterolemia, unspecified: Secondary | ICD-10-CM | POA: Diagnosis not present

## 2019-08-26 DIAGNOSIS — I252 Old myocardial infarction: Secondary | ICD-10-CM | POA: Diagnosis not present

## 2019-08-26 DIAGNOSIS — I480 Paroxysmal atrial fibrillation: Secondary | ICD-10-CM | POA: Diagnosis not present

## 2019-08-26 DIAGNOSIS — Z79899 Other long term (current) drug therapy: Secondary | ICD-10-CM | POA: Diagnosis not present

## 2019-08-26 DIAGNOSIS — I341 Nonrheumatic mitral (valve) prolapse: Secondary | ICD-10-CM | POA: Diagnosis not present

## 2019-08-26 DIAGNOSIS — Z7901 Long term (current) use of anticoagulants: Secondary | ICD-10-CM | POA: Diagnosis not present

## 2019-08-26 LAB — CBC
HCT: 36.6 % (ref 36.0–46.0)
Hemoglobin: 12.1 g/dL (ref 12.0–15.0)
MCH: 28.9 pg (ref 26.0–34.0)
MCHC: 33.1 g/dL (ref 30.0–36.0)
MCV: 87.6 fL (ref 80.0–100.0)
Platelets: 214 10*3/uL (ref 150–400)
RBC: 4.18 MIL/uL (ref 3.87–5.11)
RDW: 13.3 % (ref 11.5–15.5)
WBC: 7.1 10*3/uL (ref 4.0–10.5)
nRBC: 0 % (ref 0.0–0.2)

## 2019-08-26 NOTE — Progress Notes (Signed)
Primary Care Physician: Tower, Audrie GallusMarne A, MD Primary Cardiologist: Dr Mayford Knifeurner Primary Electrophysiologist: none Referring Physician: [facility] [place]   Latoya Parrish is a 78 y.o. female with a history of NSTEMI secondary to coronary artery vasospasm with normal coronary arteries at cath, HTN, MVP, dyslipidemia, and paroxysmal atrial fibrillation who presents for consultation in the [facility].  The patient was initially diagnosed with atrial fibrillation on 07/15/19 after presenting to the ER with symptoms of palpitations, SOB, and dizziness. During her evaluation in the ER she spontaneously converted to SR and was discharged on Eliquis. She does report she had been out in the heat that day prior to the onset of her symptoms. She denies snoring but does drink some alcohol.   On follow up today, patient reports that she has done well. She has not had any further symptoms of palpitations, dizziness, or SOB. She checks her pulse rate with a pulse oximeter at home and it has never been >100. She is tolerating the Eliquis with no bleeding issues.   Today, she denies symptoms of palpitations, chest pain, shortness of breath, orthopnea, PND, lower extremity edema, dizziness, presyncope, syncope, snoring, daytime somnolence, bleeding, or neurologic sequela. The patient is tolerating medications without difficulties and is otherwise without complaint today.    Atrial Fibrillation Risk Factors:  she does not have symptoms or diagnosis of sleep apnea. she does not have a history of rheumatic fever. she does have a history of alcohol use. The patient does not have a history of early familial atrial fibrillation or other arrhythmias.  she has a BMI of Body mass index is 24.24 kg/m.Marland Kitchen. Filed Weights   08/26/19 1003  Weight: 73.4 kg    Family History  Problem Relation Age of Onset  . Hypertension Father   . COPD Father   . Hypertension Sister      Atrial Fibrillation  Management history:  Previous antiarrhythmic drugs: none Previous cardioversions: none Previous ablations: none CHADS2VASC score: 5 Anticoagulation history: Eliquis   Past Medical History:  Diagnosis Date  . Anemia   . Aortic insufficiency    mild by echo 12/2014   . Arthritis   . Chicken pox   . Heart attack (HCC) 2006   nontransmural MI seconday to vasispasm w normal coronary arteries  . High cholesterol   . Ischemic dilated cardiomyopathy (HCC)    resolved  . MI, old 05/22/2015  . Mitral valve prolapse    with mild MR by echo 10/2015  . White coat hypertension    Past Surgical History:  Procedure Laterality Date  . acoustic neuroma   1991  . ANKLE SURGERY  2009  . CHOLECYSTECTOMY  1990  . facial plastic surgery  1996  . TONSILLECTOMY AND ADENOIDECTOMY  1946    Current Outpatient Medications  Medication Sig Dispense Refill  . apixaban (ELIQUIS) 5 MG TABS tablet Take 1 tablet (5 mg total) by mouth 2 (two) times daily. 60 tablet 5  . Calcium Carbonate-Vitamin D (CALCIUM-VITAMIN D) 500-200 MG-UNIT per tablet Take 1 tablet by mouth daily.     . carvedilol (COREG) 6.25 MG tablet TAKE 1 TABLET BY MOUTH 2 TIMES DAILY. 180 tablet 3  . diltiazem (CARDIZEM) 30 MG tablet Take 1 tablet every 4 hours AS NEEDED for AFIB heart rate >100 45 tablet 1  . losartan (COZAAR) 50 MG tablet Take 1 tablet (50 mg total) by mouth daily. 90 tablet 3  . Multiple Vitamin (MULTIVITAMIN WITH MINERALS) TABS  tablet Take 1 tablet by mouth daily.    . White Petrolatum-Mineral Oil (LUBRICANT EYE NIGHTTIME) OINT Apply 1 application to eye at bedtime as needed (dry eyes).     No current facility-administered medications for this encounter.     Allergies  Allergen Reactions  . Ace Inhibitors Cough  . Fosamax [Alendronate Sodium]     Dental problems  . Statins     Muscle pain     Social History   Socioeconomic History  . Marital status: Widowed    Spouse name: Not on file  . Number of children:  Not on file  . Years of education: Not on file  . Highest education level: Not on file  Occupational History  . Occupation: Runner, broadcasting/film/video  Social Needs  . Financial resource strain: Not on file  . Food insecurity    Worry: Not on file    Inability: Not on file  . Transportation needs    Medical: Not on file    Non-medical: Not on file  Tobacco Use  . Smoking status: Never Smoker  . Smokeless tobacco: Never Used  Substance and Sexual Activity  . Alcohol use: Yes    Alcohol/week: 0.0 standard drinks    Comment: occ  . Drug use: No  . Sexual activity: Not on file  Lifestyle  . Physical activity    Days per week: Not on file    Minutes per session: Not on file  . Stress: Not on file  Relationships  . Social Musician on phone: Not on file    Gets together: Not on file    Attends religious service: Not on file    Active member of club or organization: Not on file    Attends meetings of clubs or organizations: Not on file    Relationship status: Not on file  . Intimate partner violence    Fear of current or ex partner: Not on file    Emotionally abused: Not on file    Physically abused: Not on file    Forced sexual activity: Not on file  Other Topics Concern  . Not on file  Social History Narrative   Widowed in 2012    Working part time     ROS- All systems are reviewed and negative except as per the HPI above.  Physical Exam: Vitals:   08/26/19 1003  BP: (!) 142/86  Pulse: 80  Weight: 73.4 kg  Height: 5' 8.5" (1.74 m)    GEN- The patient is well appearing elderly female, alert and oriented x 3 today.   HEENT-head normocephalic, atraumatic, sclera clear, conjunctiva pink, hearing intact, trachea midline. Lungs- Clear to ausculation bilaterally, normal work of breathing Heart- Regular rate and rhythm, no murmurs, rubs or gallops  GI- soft, NT, ND, + BS Extremities- no clubbing, cyanosis, or edema MS- no significant deformity or atrophy Skin- no rash or  lesion Psych- euthymic mood, full affect Neuro- strength and sensation are intact   Wt Readings from Last 3 Encounters:  08/26/19 73.4 kg  07/26/19 75.2 kg  07/15/19 77.1 kg    EKG today demonstrates SR HR 80, PR 160, QRS 70, QTc 445  Echo 12/04/17 demonstrated  - Left ventricle: The cavity size was normal. There was severe   focal basal and mild concentric hypertrophy. Systolic function   was normal. The estimated ejection fraction was in the range of   60% to 65%. Wall motion was normal; there were no regional wall  motion abnormalities. Left ventricular diastolic function   parameters were normal. - Aortic valve: Severe focal calcification involving the   noncoronary cusp. There was mild regurgitation. - Mitral valve: No echocardiographic evidence for prolapse. - Left atrium: The atrium was mildly dilated. - Pulmonic valve: There was mild regurgitation. - Pulmonary arteries: PA peak pressure: 32 mm Hg (S).   Epic records are reviewed at length today   Assessment and Plan:  1. Paroxysmal atrial fibrillation Patient appears to be maintaining SR. Continue Eliquis 5 mg BID Continue diltizem 30 mg PRN for heart racing. Would consider AAD should afib become more persistent.  Lifestyle modification was discussed and encouraged including regular physical activity. Check CBC today.  This patients CHA2DS2-VASc Score and unadjusted Ischemic Stroke Rate (% per year) is equal to 7.2 % stroke rate/year from a score of 5  Above score calculated as 1 point each if present [CHF, HTN, DM, Vascular=MI/PAD/Aortic Plaque, Age if 65-74, or Female] Above score calculated as 2 points each if present [Age > 75, or Stroke/TIA/TE]   2. HTN Stable, BP log from home shows excellent control. No changes today.  5. H/o ischemic CM 2/2 coronary artery vasospasm. No signs or symptoms of fluid overload. Continue present therapy.    Follow up with Dr Radford Pax per recall. AF clinic in 6 months.    [facility] [address] [place], [place] [address] [phone] 08/26/2019 11:27 AM

## 2019-08-31 ENCOUNTER — Ambulatory Visit (INDEPENDENT_AMBULATORY_CARE_PROVIDER_SITE_OTHER): Payer: Medicare Other

## 2019-08-31 DIAGNOSIS — Z23 Encounter for immunization: Secondary | ICD-10-CM

## 2019-09-15 ENCOUNTER — Encounter: Payer: Self-pay | Admitting: Cardiology

## 2019-09-15 ENCOUNTER — Other Ambulatory Visit: Payer: Self-pay

## 2019-09-15 ENCOUNTER — Ambulatory Visit: Payer: Medicare Other | Admitting: Cardiology

## 2019-09-15 VITALS — BP 136/76 | HR 72 | Ht 68.5 in | Wt 162.0 lb

## 2019-09-15 DIAGNOSIS — R600 Localized edema: Secondary | ICD-10-CM | POA: Diagnosis not present

## 2019-09-15 DIAGNOSIS — I10 Essential (primary) hypertension: Secondary | ICD-10-CM | POA: Diagnosis not present

## 2019-09-15 DIAGNOSIS — I351 Nonrheumatic aortic (valve) insufficiency: Secondary | ICD-10-CM | POA: Diagnosis not present

## 2019-09-15 DIAGNOSIS — I480 Paroxysmal atrial fibrillation: Secondary | ICD-10-CM | POA: Diagnosis not present

## 2019-09-15 DIAGNOSIS — I341 Nonrheumatic mitral (valve) prolapse: Secondary | ICD-10-CM | POA: Diagnosis not present

## 2019-09-15 MED ORDER — FUROSEMIDE 20 MG PO TABS: 20.0000 mg | ORAL_TABLET | Freq: Every day | ORAL | 6 refills | Status: DC

## 2019-09-15 NOTE — Patient Instructions (Signed)
Medication Instructions:  Your physician has recommended you make the following change in your medication:  TAKE Lasix (Furosemide) 20 mg daily as needed for leg swelling/weight gain  *If you need a refill on your cardiac medications before your next appointment, please call your pharmacy*   Lab Work: None Ordered    Testing/Procedures: Your physician has requested that you have an echocardiogram. Echocardiography is a painless test that uses sound waves to create images of your heart. It provides your doctor with information about the size and shape of your heart and how well your heart's chambers and valves are working. This procedure takes approximately one hour. There are no restrictions for this procedure.    Follow-Up: At [facility], you and your health needs are our priority.  As part of our continuing mission to provide you with exceptional heart care, we have created designated Provider Care Teams.  These Care Teams include your primary Cardiologist (physician) and Advanced Practice Providers (APPs -  Physician Assistants and Nurse Practitioners) who all work together to provide you with the care you need, when you need it.  Your next appointment:   12 months  The format for your next appointment:   Either In Person or Virtual  Provider:   You may see  or one of the following Advanced Practice Providers on your designated Care Team:    Melina Copa, PA-C  Ermalinda Barrios, PA-C

## 2019-09-15 NOTE — Progress Notes (Signed)
Cardiology Office Note:    Date:  09/15/2019   ID:  Latoya Parrish, DOB 1941-06-14, MRN [id number]  PCP:  Abner Greenspan, MD  Cardiologist:  No primary care provider on file.    Referring MD: Abner Greenspan, MD   Chief Complaint  Patient presents with  . Follow-up    edema, coronary vasospasm, HTN    History of Present Illness:    Latoya Parrish is a 78 y.o. female with a hx of NSTEMI secondary to coronary artery vasospasm with normal coronary arteries at cath, HTN and dyslipidemia.  Since I saw her last she was dx with atrial fibrillation in August and has been followed in afib clinic.  She is now on Eliquis 5mg  BID and maintaining NSR on last afib clinic visit.    She is here today because she has been having LE edema.  She denies any chest pain or pressure, SOB, DOE, PND, orthopnea,  Dizziness or syncope. Ever since her dx of afib she has had intermittent episodes of LE edema which resolve by the am.  She admits to using table salt in her diet and has tried to cut back but has not completely stopped.  She did cut back on ETOH use due to her PAD. She is compliant with her meds and is tolerating meds with no SE.    Past Medical History:  Diagnosis Date  . Anemia   . Aortic insufficiency    mild by echo 12/2014   . Arthritis   . Chicken pox   . Heart attack ([place]) 2006   nontransmural MI seconday to vasispasm w normal coronary arteries  . High cholesterol   . Ischemic dilated cardiomyopathy ([place])    resolved  . MI, old 05/22/2015  . Mitral valve prolapse    with mild MR by echo 10/2015  . White coat hypertension     Past Surgical History:  Procedure Laterality Date  . acoustic neuroma   1991  . ANKLE SURGERY  2009  . CHOLECYSTECTOMY  1990  . facial plastic surgery  1996  . TONSILLECTOMY AND ADENOIDECTOMY  1946    Current Medications: Current Meds  Medication Sig  . apixaban (ELIQUIS) 5 MG TABS tablet Take 1 tablet (5 mg total) by mouth 2 (two) times daily.  .  Calcium Carbonate-Vitamin D (CALCIUM-VITAMIN D) 500-200 MG-UNIT per tablet Take 1 tablet by mouth daily.   . carvedilol (COREG) 6.25 MG tablet TAKE 1 TABLET BY MOUTH 2 TIMES DAILY.  Marland Kitchen diltiazem (CARDIZEM) 30 MG tablet Take 1 tablet every 4 hours AS NEEDED for AFIB heart rate >100  . losartan (COZAAR) 50 MG tablet Take 1 tablet (50 mg total) by mouth daily.  . Multiple Vitamin (MULTIVITAMIN WITH MINERALS) TABS tablet Take 1 tablet by mouth daily.  . White Petrolatum-Mineral Oil (LUBRICANT EYE NIGHTTIME) OINT Apply 1 application to eye at bedtime as needed (dry eyes).     Allergies:   Ace inhibitors, Fosamax [alendronate sodium], and Statins   Social History   Socioeconomic History  . Marital status: Widowed    Spouse name: Not on file  . Number of children: Not on file  . Years of education: Not on file  . Highest education level: Not on file  Occupational History  . Occupation: Pharmacist, hospital  Social Needs  . Financial resource strain: Not on file  . Food insecurity    Worry: Not on file    Inability: Not on file  . Transportation needs  Medical: Not on file    Non-medical: Not on file  Tobacco Use  . Smoking status: Never Smoker  . Smokeless tobacco: Never Used  Substance and Sexual Activity  . Alcohol use: Yes    Alcohol/week: 0.0 standard drinks    Comment: occ  . Drug use: No  . Sexual activity: Not on file  Lifestyle  . Physical activity    Days per week: Not on file    Minutes per session: Not on file  . Stress: Not on file  Relationships  . Social Musician on phone: Not on file    Gets together: Not on file    Attends religious service: Not on file    Active member of club or organization: Not on file    Attends meetings of clubs or organizations: Not on file    Relationship status: Not on file  Other Topics Concern  . Not on file  Social History Narrative   Widowed in 2012    Working part time     Family History: The patient's family history  includes COPD in her father; Hypertension in her father and sister.  ROS:   Please see the history of present illness.    ROS  All other systems reviewed and negative.   EKGs/Labs/Other Studies Reviewed:    The following studies were reviewed today: none  EKG:  EKG is  ordered today.  The ekg ordered today demonstrates NSR with nonspecific ST abnormality  Recent Labs: 01/25/2019: ALT 14 07/15/2019: BUN 16; Creatinine, Ser 0.94; Magnesium 1.9; Potassium 4.0; Sodium 137 07/26/2019: TSH 1.497 08/26/2019: Hemoglobin 12.1; Platelets 214   Recent Lipid Panel    Component Value Date/Time   CHOL 231 (H) 01/25/2019 1056   TRIG 259.0 (H) 01/25/2019 1056   HDL 48.50 01/25/2019 1056   CHOLHDL 5 01/25/2019 1056   VLDL 51.8 (H) 01/25/2019 1056   LDLCALC 121 (H) 01/04/2016 0907   LDLDIRECT 145.0 01/25/2019 1056    Physical Exam:    VS:  BP 136/76   Pulse 72   Ht 5' 8.5" (1.74 m)   Wt 162 lb (73.5 kg)   BMI 24.27 kg/m     Wt Readings from Last 3 Encounters:  09/15/19 162 lb (73.5 kg)  08/26/19 161 lb 12.8 oz (73.4 kg)  07/26/19 165 lb 12.8 oz (75.2 kg)     GEN:  Well nourished, well developed in no acute distress HEENT: Normal NECK: No JVD; No carotid bruits LYMPHATICS: No lymphadenopathy CARDIAC: RRR, no murmurs, rubs, gallops RESPIRATORY:  Clear to auscultation without rales, wheezing or rhonchi  ABDOMEN: Soft, non-tender, non-distended MUSCULOSKELETAL:  Trace edema; No deformity  SKIN: Warm and dry NEUROLOGIC:  Alert and oriented x 3 PSYCHIATRIC:  Normal affect   ASSESSMENT:    1. Bilateral leg edema   2. Essential hypertension   3. Mitral valve prolapse   4. Nonrheumatic aortic valve insufficiency   5. Paroxysmal atrial fibrillation (HCC)    PLAN:    In order of problems listed above:  1.  LE edema -start lasix 20mg  daily PRN for edema -encouraged her to cut back on added Na in her diet -repeat 2D echo to reassess LVF -Creatinine 0.94 in August  2.  HTN  -BP controlled on exam -continue Carvedilol 6.25mg  BID and Losartan 50mg  daily  3.  MVP -no prolapse on last echo 11/2017  4.  AV sclerosis and AI -mild AI on echo 2019  5.  PAF -new dx  in August -now on Eliquis 5mg  BID and PRN Cardizem -she has only had 1 episode of palpitations since then and very short lived    Medication Adjustments/Labs and Tests Ordered: Current medicines are reviewed at length with the patient today.  Concerns regarding medicines are outlined above.  Orders Placed This Encounter  Procedures  . EKG 12-Lead  . ECHOCARDIOGRAM COMPLETE   Meds ordered this encounter  Medications  . furosemide (LASIX) 20 MG tablet    Sig: Take 1 tablet (20 mg total) by mouth daily.    Dispense:  60 tablet    Refill:  6    Signed, Armanda Magicraci , MD  09/15/2019 1:10 PM    [facility]

## 2019-09-30 ENCOUNTER — Ambulatory Visit (HOSPITAL_COMMUNITY): Payer: Medicare Other | Attending: Cardiology

## 2019-09-30 ENCOUNTER — Other Ambulatory Visit: Payer: Self-pay

## 2019-09-30 DIAGNOSIS — R600 Localized edema: Secondary | ICD-10-CM

## 2019-09-30 DIAGNOSIS — I351 Nonrheumatic aortic (valve) insufficiency: Secondary | ICD-10-CM

## 2019-09-30 DIAGNOSIS — I341 Nonrheumatic mitral (valve) prolapse: Secondary | ICD-10-CM | POA: Diagnosis not present

## 2019-10-04 ENCOUNTER — Encounter: Payer: Self-pay | Admitting: Cardiology

## 2019-10-04 DIAGNOSIS — I350 Nonrheumatic aortic (valve) stenosis: Secondary | ICD-10-CM | POA: Insufficient documentation

## 2019-10-05 ENCOUNTER — Telehealth: Payer: Self-pay

## 2019-10-05 DIAGNOSIS — I359 Nonrheumatic aortic valve disorder, unspecified: Secondary | ICD-10-CM

## 2019-10-05 NOTE — Telephone Encounter (Signed)
-----   Message from Sueanne Margarita, MD sent at 10/04/2019  6:57 PM EST ----- Echo showed normal LVF with increased thickness, mildly enlarged LA, trivial leakiness of the TV and MV, moderate leakiness of the AV and mild AS, increased stiffness of heart muscle - repeat echo in 1 year for AV disease

## 2019-10-05 NOTE — Telephone Encounter (Signed)
The patient has been notified of the result and verbalized understanding.  All questions (if any) were answered. Antonieta Iba, RN 10/05/2019 9:06 AM

## 2020-02-14 ENCOUNTER — Other Ambulatory Visit: Payer: Self-pay | Admitting: Cardiology

## 2020-02-14 NOTE — Telephone Encounter (Signed)
Eliquis 5mg  refill request received, pt is 79yrs old, weight-73.5kg, Crea-0.94 on 07/15/2019, Diagnosis-Afib, and last seen by Dr. 07/17/2019 on 09/15/2019. Dose is appropriate based on dosing criteria. Will send in refill to requested pharmacy.

## 2020-03-02 ENCOUNTER — Other Ambulatory Visit: Payer: Self-pay

## 2020-03-02 ENCOUNTER — Ambulatory Visit (HOSPITAL_COMMUNITY)
Admission: RE | Admit: 2020-03-02 | Discharge: 2020-03-02 | Disposition: A | Discharge status: Home / Self Care | Payer: Medicare Other | Source: Ambulatory Visit | Attending: Physician Assistant | Admitting: Physician Assistant

## 2020-03-02 ENCOUNTER — Encounter (HOSPITAL_COMMUNITY): Payer: Self-pay | Admitting: Physician Assistant

## 2020-03-02 VITALS — BP 146/72 | HR 71 | Ht 68.5 in | Wt 165.2 lb

## 2020-03-02 DIAGNOSIS — D649 Anemia, unspecified: Secondary | ICD-10-CM | POA: Diagnosis not present

## 2020-03-02 DIAGNOSIS — E785 Hyperlipidemia, unspecified: Secondary | ICD-10-CM | POA: Insufficient documentation

## 2020-03-02 DIAGNOSIS — Z79899 Other long term (current) drug therapy: Secondary | ICD-10-CM | POA: Diagnosis not present

## 2020-03-02 DIAGNOSIS — Z8249 Family history of ischemic heart disease and other diseases of the circulatory system: Secondary | ICD-10-CM | POA: Diagnosis not present

## 2020-03-02 DIAGNOSIS — D6869 Other thrombophilia: Secondary | ICD-10-CM

## 2020-03-02 DIAGNOSIS — I480 Paroxysmal atrial fibrillation: Secondary | ICD-10-CM | POA: Diagnosis not present

## 2020-03-02 DIAGNOSIS — Z7901 Long term (current) use of anticoagulants: Secondary | ICD-10-CM | POA: Diagnosis not present

## 2020-03-02 DIAGNOSIS — E7800 Pure hypercholesterolemia, unspecified: Secondary | ICD-10-CM | POA: Insufficient documentation

## 2020-03-02 DIAGNOSIS — I10 Essential (primary) hypertension: Secondary | ICD-10-CM | POA: Diagnosis not present

## 2020-03-02 DIAGNOSIS — I252 Old myocardial infarction: Secondary | ICD-10-CM | POA: Insufficient documentation

## 2020-03-02 NOTE — Progress Notes (Signed)
Primary Care Physician: Tower, Audrie Gallus, MD Primary Cardiologist: Dr Mayford Knife Primary Electrophysiologist: none Referring Physician: [facility]   Latoya Parrish is a 79 y.o. female with a history of NSTEMI secondary to coronary artery vasospasm with normal coronary arteries at cath, HTN, MVP, dyslipidemia, and paroxysmal atrial fibrillation who presents for follow up in the [facility].  The patient was initially diagnosed with atrial fibrillation on 07/15/19 after presenting to the ER with symptoms of palpitations, SOB, and dizziness. During her evaluation in the ER she spontaneously converted to SR and was discharged on Eliquis. She does report she had been out in the heat that day prior to the onset of her symptoms. She denies snoring but does drink some alcohol.   On follow up today, patient reports doing well since her last visit. She had 2 episodes of afib which lasted about 3-4 minutes each. She did not have to take her PRN diltiazem. She denies any bleeding issues with anticoagulation.   Today, she denies symptoms of palpitations, chest pain, shortness of breath, orthopnea, PND, lower extremity edema, dizziness, presyncope, syncope, snoring, daytime somnolence, bleeding, or neurologic sequela. The patient is tolerating medications without difficulties and is otherwise without complaint today.    Atrial Fibrillation Risk Factors:  she does not have symptoms or diagnosis of sleep apnea. she does not have a history of rheumatic fever. she does have a history of alcohol use. The patient does not have a history of early familial atrial fibrillation or other arrhythmias.  she has a BMI of Body mass index is 24.75 kg/m.Marland Kitchen Filed Weights   03/02/20 1005  Weight: 74.9 kg    Family History  Problem Relation Age of Onset  . Hypertension Father   . COPD Father   . Hypertension Sister      Atrial Fibrillation Management history:  Previous antiarrhythmic  drugs: none Previous cardioversions: none Previous ablations: none CHADS2VASC score: 5 Anticoagulation history: Eliquis   Past Medical History:  Diagnosis Date  . Anemia   . Aortic insufficiency    moderate by echo 09/2019  . Aortic stenosis    mild by echo 09/2019  . Arthritis   . Chicken pox   . Heart attack (HCC) 2006   nontransmural MI seconday to vasispasm w normal coronary arteries  . High cholesterol   . Ischemic dilated cardiomyopathy (HCC)    resolved  . MI, old 05/22/2015  . Mitral valve prolapse    with mild MR by echo 10/2015  . White coat hypertension    Past Surgical History:  Procedure Laterality Date  . acoustic neuroma   1991  . ANKLE SURGERY  2009  . CHOLECYSTECTOMY  1990  . facial plastic surgery  1996  . TONSILLECTOMY AND ADENOIDECTOMY  1946    Current Outpatient Medications  Medication Sig Dispense Refill  . Calcium Carbonate-Vitamin D (CALCIUM-VITAMIN D) 500-200 MG-UNIT per tablet Take 1 tablet by mouth daily.     . carvedilol (COREG) 6.25 MG tablet TAKE 1 TABLET BY MOUTH 2 TIMES DAILY. 180 tablet 3  . diltiazem (CARDIZEM) 30 MG tablet Take 1 tablet every 4 hours AS NEEDED for AFIB heart rate >100 45 tablet 1  . ELIQUIS 5 MG TABS tablet TAKE 1 TABLET BY MOUTH 2 TIMES DAILY. 60 tablet 5  . furosemide (LASIX) 20 MG tablet Take 1 tablet (20 mg total) by mouth daily. 60 tablet 6  . losartan (COZAAR) 50 MG tablet Take 1 tablet (50  mg total) by mouth daily. 90 tablet 3  . Multiple Vitamin (MULTIVITAMIN WITH MINERALS) TABS tablet Take 1 tablet by mouth daily.    . White Petrolatum-Mineral Oil (LUBRICANT EYE NIGHTTIME) OINT Apply 1 application to eye at bedtime as needed (dry eyes).     No current facility-administered medications for this encounter.    Allergies  Allergen Reactions  . Ace Inhibitors Cough  . Fosamax [Alendronate Sodium]     Dental problems  . Statins     Muscle pain     Social History   Socioeconomic History  . Marital  status: Widowed    Spouse name: Not on file  . Number of children: Not on file  . Years of education: Not on file  . Highest education level: Not on file  Occupational History  . Occupation: Pharmacist, hospital  Tobacco Use  . Smoking status: Never Smoker  . Smokeless tobacco: Never Used  Substance and Sexual Activity  . Alcohol use: Yes    Alcohol/week: 1.0 standard drinks    Types: 1 Glasses of wine per week  . Drug use: No  . Sexual activity: Not on file  Other Topics Concern  . Not on file  Social History Narrative   Widowed in 2012    Working part time   Social Determinants of Radio broadcast assistant Strain:   . Difficulty of Paying Living Expenses:   Food Insecurity:   . Worried About Charity fundraiser in the Last Year:   . Arboriculturist in the Last Year:   Transportation Needs:   . Film/video editor (Medical):   Marland Kitchen Lack of Transportation (Non-Medical):   Physical Activity:   . Days of Exercise per Week:   . Minutes of Exercise per Session:   Stress:   . Feeling of Stress :   Social Connections:   . Frequency of Communication with Friends and Family:   . Frequency of Social Gatherings with Friends and Family:   . Attends Religious Services:   . Active Member of Clubs or Organizations:   . Attends Archivist Meetings:   Marland Kitchen Marital Status:   Intimate Partner Violence:   . Fear of Current or Ex-Partner:   . Emotionally Abused:   Marland Kitchen Physically Abused:   . Sexually Abused:      ROS- All systems are reviewed and negative except as per the HPI above.  Physical Exam: Vitals:   03/02/20 1005  BP: (!) 146/72  Pulse: 71  Weight: 74.9 kg  Height: 5' 8.5" (1.74 m)    GEN- The patient is well appearing elderly female, alert and oriented x 3 today.   HEENT-head normocephalic, atraumatic, sclera clear, conjunctiva pink, hearing intact, trachea midline. Lungs- Clear to ausculation bilaterally, normal work of breathing Heart- Regular rate and rhythm, no  murmurs, rubs or gallops  GI- soft, NT, ND, + BS Extremities- no clubbing, cyanosis, or edema MS- no significant deformity or atrophy Skin- no rash or lesion Psych- euthymic mood, full affect Neuro- strength and sensation are intact   Wt Readings from Last 3 Encounters:  03/02/20 74.9 kg  09/15/19 73.5 kg  08/26/19 73.4 kg    EKG today demonstrates SR HR 71, PR 194, QRS 70, QTc 423  Echo 09/30/19 demonstrated  1. The average left ventricular global longitudinal strain is -18.5 %.  2. Left ventricular ejection fraction, by visual estimation, is 60 to  65%. The left ventricle has normal function. Left ventricular septal wall  thickness was moderately increased. Moderately increased left ventricular  posterior wall thickness.  3. There is a dynamic LVOT obstruction with a peak gradient of  which incraeses to with Valsalva.  4. Global right ventricle has normal systolic function.The right  ventricular size is normal. No increase in right ventricular wall  thickness.  5. Left atrial size was mildly dilated.  6. Right atrial size was normal.  7. The mitral valve is normal in structure. Trace mitral valve  regurgitation. No evidence of mitral stenosis.  8. The tricuspid valve is normal in structure. Tricuspid valve  regurgitation is trivial.  9. The aortic valve is tricuspid. There is Moderate calcification of the  non coronary cusp. Aortic valve regurgitation is moderate. There is mild  aortic stenosis.  10. The pulmonic valve was normal in structure. Pulmonic valve  regurgitation is mild.  11. Normal pulmonary artery systolic pressure.  12. The inferior vena cava is normal in size with greater than 50%  respiratory variability, suggesting right atrial pressure of 3 mmHg.  13. .  14. Left ventricular diastolic parameters are consistent with Grade II  diastolic dysfunction (pseudonormalization).  15. Elevated left ventricular end-diastolic pressure.  16.  Aortic valve regurgitation is moderate.    Epic records are reviewed at length today   Assessment and Plan:  1. Paroxysmal atrial fibrillation Patient appears to be maintaining SR with rare, brief palpitations. Continue Eliquis 5 mg BID Continue diltizem 30 mg PRN for heart racing. Patient interested in increasing her physical activity, interested in [facility] program, will refer.   This patients CHA2DS2-VASc Score and unadjusted Ischemic Stroke Rate (% per year) is equal to 7.2 % stroke rate/year from a score of 5  Above score calculated as 1 point each if present [CHF, HTN, DM, Vascular=MI/PAD/Aortic Plaque, Age if 65-74, or Female] Above score calculated as 2 points each if present [Age > 75, or Stroke/TIA/TE]   2. HTN Stable, no changes today.   Follow up with Dr Mayford Knife per recall. AF clinic in one year.   Jorja Loa PA-C Af[facility] [address] [place], [place] [address] [phone] 03/02/2020 10:40 AM

## 2020-03-10 ENCOUNTER — Telehealth: Payer: Self-pay

## 2020-03-10 NOTE — Telephone Encounter (Signed)
Call to patient reference PREP referral [facility] requesting call back to discuss.

## 2020-03-13 ENCOUNTER — Other Ambulatory Visit: Payer: Self-pay | Admitting: Cardiology

## 2020-04-13 ENCOUNTER — Other Ambulatory Visit: Payer: Self-pay | Admitting: Cardiology

## 2020-08-17 ENCOUNTER — Other Ambulatory Visit: Payer: Self-pay | Admitting: Cardiology

## 2020-08-17 DIAGNOSIS — I480 Paroxysmal atrial fibrillation: Secondary | ICD-10-CM

## 2020-08-17 NOTE — Telephone Encounter (Signed)
Eliquis 5mg  refill request received. Patient is 79 years old, weight-74.9kg, Crea-0.94 on 07/15/2019-NEEDS UPDATED LABS, Diagnosis-Afib, and last seen by 07/17/2019 on 03/02/2020 and has an appt with Dr. 05/02/2020 09/14/2020 so will add a note for labs to be done. Dose is appropriate based on dosing criteria. Will send in refill to requested pharmacy.   Also, placed an order for labs when pt has appt with Dr. 09/16/2020.

## 2020-09-06 ENCOUNTER — Other Ambulatory Visit: Payer: Self-pay | Admitting: Cardiology

## 2020-09-14 ENCOUNTER — Ambulatory Visit: Payer: Medicare Other | Admitting: Cardiology

## 2020-09-14 ENCOUNTER — Encounter: Payer: Self-pay | Admitting: Cardiology

## 2020-09-14 ENCOUNTER — Other Ambulatory Visit: Payer: Medicare Other | Admitting: *Deleted

## 2020-09-14 ENCOUNTER — Other Ambulatory Visit: Payer: Self-pay

## 2020-09-14 VITALS — BP 118/68 | HR 70 | Ht 68.5 in | Wt 156.0 lb

## 2020-09-14 DIAGNOSIS — I341 Nonrheumatic mitral (valve) prolapse: Secondary | ICD-10-CM | POA: Diagnosis not present

## 2020-09-14 DIAGNOSIS — I480 Paroxysmal atrial fibrillation: Secondary | ICD-10-CM | POA: Diagnosis not present

## 2020-09-14 DIAGNOSIS — I10 Essential (primary) hypertension: Secondary | ICD-10-CM

## 2020-09-14 DIAGNOSIS — I351 Nonrheumatic aortic (valve) insufficiency: Secondary | ICD-10-CM

## 2020-09-14 DIAGNOSIS — R600 Localized edema: Secondary | ICD-10-CM | POA: Diagnosis not present

## 2020-09-14 LAB — CBC
Hematocrit: 34.6 % (ref 34.0–46.6)
Hemoglobin: 11.2 g/dL (ref 11.1–15.9)
MCH: 27.7 pg (ref 26.6–33.0)
MCHC: 32.4 g/dL (ref 31.5–35.7)
MCV: 86 fL (ref 79–97)
Platelets: 221 10*3/uL (ref 150–450)
RBC: 4.04 x10E6/uL (ref 3.77–5.28)
RDW: 12.6 % (ref 11.7–15.4)
WBC: 7.9 10*3/uL (ref 3.4–10.8)

## 2020-09-14 LAB — BASIC METABOLIC PANEL
BUN/Creatinine Ratio: 11 — ABNORMAL LOW (ref 12–28)
BUN: 12 mg/dL (ref 8–27)
CO2: 23 mmol/L (ref 20–29)
Calcium: 9.1 mg/dL (ref 8.7–10.3)
Chloride: 102 mmol/L (ref 96–106)
Creatinine, Ser: 1.05 mg/dL — ABNORMAL HIGH (ref 0.57–1.00)
GFR calc Af Amer: 58 mL/min/{1.73_m2} — ABNORMAL LOW (ref >59)
GFR calc non Af Amer: 50 mL/min/{1.73_m2} — ABNORMAL LOW (ref >59)
Glucose: 100 mg/dL — ABNORMAL HIGH (ref 65–99)
Potassium: 4.4 mmol/L (ref 3.5–5.2)
Sodium: 138 mmol/L (ref 134–144)

## 2020-09-14 NOTE — Patient Instructions (Addendum)
Medication Instructions:  Your physician recommends that you continue on your current medications as directed. Please refer to the Current Medication list given to you today.  *If you need a refill on your cardiac medications before your next appointment, please call your pharmacy*  Lab Work: TODAY: CBC and BMET If you have labs (blood work) drawn today and your tests are completely normal, you will receive your results only by: Marland Kitchen MyChart Message (if you have MyChart) OR . A paper copy in the mail If you have any lab test that is abnormal or we need to change your treatment, we will call you to review the results.   Testing/Procedures: Your physician has requested that you have an echocardiogram. Echocardiography is a painless test that uses sound waves to create images of your heart. It provides your doctor with information about the size and shape of your heart and how well your heart's chambers and valves are working. This procedure takes approximately one hour. There are no restrictions for this procedure.  Follow-Up: At [facility], you and your health needs are our priority.  As part of our continuing mission to provide you with exceptional heart care, we have created designated Provider Care Teams.  These Care Teams include your primary Cardiologist (physician) and Advanced Practice Providers (APPs -  Physician Assistants and Nurse Practitioners) who all work together to provide you with the care you need, when you need it.  Your next appointment:   1 year(s)  The format for your next appointment:   In Person  Provider:   You may see Armanda Magic, MD or one of the following Advanced Practice Providers on your designated Care Team:    Ronie Spies, PA-C  Jacolyn Reedy, PA-C

## 2020-09-14 NOTE — Addendum Note (Signed)
Addended by: Theresia Majors on: 09/14/2020 09:55 AM   Modules accepted: Orders

## 2020-09-14 NOTE — Progress Notes (Addendum)
Cardiology Office Note:    Date:  09/14/2020   ID:  SARANN TREGRE, DOB 1941/01/07, MRN [id number]  PCP:  Judy Pimple, MD  Cardiologist:  Armanda Magic, MD    Referring MD: Judy Pimple, MD   Chief Complaint  Patient presents with  . Atrial Fibrillation  . Hypertension    History of Present Illness:    Latoya Parrish is a 79 y.o. female with a hx of NSTEMI secondary to coronary artery vasospasm with normal coronary arteries at cath, HTN, PAF on chronic anticoagulation with apixaban and dyslipidemia.     She is here today for followup and is doing well.  She denies any chest pain or pressure, PND, orthopnea, dizziness or syncope. She occasionally has DOE if she exerts herself too much.  Rarely she will have some palpitations but not sustained.  Occasionally she will have LE edema if she eats too much Na in her diet.  She is compliant with her meds and is tolerating meds with no SE.    Past Medical History:  Diagnosis Date  . Anemia   . Aortic insufficiency    moderate by echo 09/2019  . Aortic stenosis    mild by echo 09/2019  . Arthritis   . Chicken pox   . Heart attack (HCC) 2006   nontransmural MI seconday to vasispasm w normal coronary arteries  . High cholesterol   . Ischemic dilated cardiomyopathy (HCC)    resolved  . MI, old 05/22/2015  . Mitral valve prolapse    with mild MR by echo 10/2015  . White coat hypertension     Past Surgical History:  Procedure Laterality Date  . acoustic neuroma   1991  . ANKLE SURGERY  2009  . CHOLECYSTECTOMY  1990  . facial plastic surgery  1996  . TONSILLECTOMY AND ADENOIDECTOMY  1946    Current Medications: Current Meds  Medication Sig  . Calcium Carbonate-Vitamin D (CALCIUM-VITAMIN D) 500-200 MG-UNIT per tablet Take 1 tablet by mouth daily.   . carvedilol (COREG) 6.25 MG tablet TAKE 1 TABLET BY MOUTH 2 TIMES DAILY.  Marland Kitchen diltiazem (CARDIZEM) 30 MG tablet Take 1 tablet every 4 hours AS NEEDED for AFIB heart rate >100  .  ELIQUIS 5 MG TABS tablet TAKE 1 TABLET BY MOUTH 2 TIMES DAILY.  . furosemide (LASIX) 20 MG tablet Take 1 tablet (20 mg total) by mouth daily.  Marland Kitchen losartan (COZAAR) 50 MG tablet TAKE 1 TABLET BY MOUTH DAILY.  . Multiple Vitamin (MULTIVITAMIN WITH MINERALS) TABS tablet Take 1 tablet by mouth daily.  . White Petrolatum-Mineral Oil (LUBRICANT EYE NIGHTTIME) OINT Apply 1 application to eye at bedtime as needed (dry eyes).     Allergies:   Ace inhibitors, Fosamax [alendronate sodium], and Statins   Social History   Socioeconomic History  . Marital status: Widowed    Spouse name: Not on file  . Number of children: Not on file  . Years of education: Not on file  . Highest education level: Not on file  Occupational History  . Occupation: Runner, broadcasting/film/video  Tobacco Use  . Smoking status: Never Smoker  . Smokeless tobacco: Never Used  Substance and Sexual Activity  . Alcohol use: Yes    Alcohol/week: 1.0 standard drink    Types: 1 Glasses of wine per week  . Drug use: No  . Sexual activity: Not on file  Other Topics Concern  . Not on file  Social History Narrative   Widowed in  2012    Working part time   International aid/development worker of Corporate investment banker Strain:   . Difficulty of Paying Living Expenses: Not on file  Food Insecurity:   . Worried About Programme researcher, broadcasting/film/video in the Last Year: Not on file  . Ran Out of Food in the Last Year: Not on file  Transportation Needs:   . Lack of Transportation (Medical): Not on file  . Lack of Transportation (Non-Medical): Not on file  Physical Activity:   . Days of Exercise per Week: Not on file  . Minutes of Exercise per Session: Not on file  Stress:   . Feeling of Stress : Not on file  Social Connections:   . Frequency of Communication with Friends and Family: Not on file  . Frequency of Social Gatherings with Friends and Family: Not on file  . Attends Religious Services: Not on file  . Active Member of Clubs or Organizations: Not on file  .  Attends Banker Meetings: Not on file  . Marital Status: Not on file     Family History: The patient's family history includes COPD in her father; Hypertension in her father and sister.  ROS:   Please see the history of present illness.    ROS  All other systems reviewed and negative.   EKGs/Labs/Other Studies Reviewed:    The following studies were reviewed today: none  EKG:  EKG is  ordered today and showed NSR at 70bpm with LVH with repol abnormality  Recent Labs: No results found for requested labs within last 8760 hours.   Recent Lipid Panel    Component Value Date/Time   CHOL 231 (H) 01/25/2019 1056   TRIG 259.0 (H) 01/25/2019 1056   HDL 48.50 01/25/2019 1056   CHOLHDL 5 01/25/2019 1056   VLDL 51.8 (H) 01/25/2019 1056   LDLCALC 121 (H) 01/04/2016 0907   LDLDIRECT 145.0 01/25/2019 1056    Physical Exam:    VS:  BP 118/68   Pulse 70   Ht 5' 8.5" (1.74 m)   Wt 156 lb (70.8 kg)   SpO2 97%   BMI 23.37 kg/m     Wt Readings from Last 3 Encounters:  09/14/20 156 lb (70.8 kg)  03/02/20 165 lb 3.2 oz (74.9 kg)  09/15/19 162 lb (73.5 kg)     GEN: Well nourished, well developed in no acute distress HEENT: Normal NECK: No JVD; No carotid bruits LYMPHATICS: No lymphadenopathy CARDIAC:RRR, no rubs, gallops.  2/6 SM at LLSB RESPIRATORY:  Clear to auscultation without rales, wheezing or rhonchi  ABDOMEN: Soft, non-tender, non-distended MUSCULOSKELETAL:  No edema; No deformity  SKIN: Warm and dry NEUROLOGIC:  Alert and oriented x 3 PSYCHIATRIC:  Normal affect    ASSESSMENT:    1. Bilateral leg edema   2. Essential hypertension   3. Mitral valve prolapse   4. Nonrheumatic aortic valve insufficiency   5. Paroxysmal atrial fibrillation (HCC)    PLAN:    In order of problems listed above:  1.  LE edema -this has improved on PRN diuretics and low Na diet -continue PRN  lasix  -continue to follow low Na diet  2.  HTN -BP controlled on exam  today -continue Carvedilol 6.25mg  BID and Losartan 50mg  daily  3.  MVP -no prolapse on last echo 11/2017  4.  Aortic stenosis and AI -mild AS and moderate AI on echo 09/2019 -repeat echo to make sure this stable   5.  PAF -  she is maintaining NSR on exam -continue Eliquis 5mg  BID, Carvedilol 6.25mg  BID and PRN Cardizem -check BMET and CBC   Medication Adjustments/Labs and Tests Ordered: Current medicines are reviewed at length with the patient today.  Concerns regarding medicines are outlined above.  Orders Placed This Encounter  Procedures  . EKG 12-Lead   No orders of the defined types were placed in this encounter.   Signed, , MD  09/14/2020 9:36 AM    [facility]

## 2020-10-06 ENCOUNTER — Other Ambulatory Visit: Payer: Self-pay

## 2020-10-06 ENCOUNTER — Ambulatory Visit (HOSPITAL_COMMUNITY): Payer: Medicare Other | Attending: Internal Medicine

## 2020-10-06 DIAGNOSIS — I351 Nonrheumatic aortic (valve) insufficiency: Secondary | ICD-10-CM | POA: Insufficient documentation

## 2020-10-06 LAB — ECHOCARDIOGRAM COMPLETE
AV Mean grad: 8 mmHg
AV Peak grad: 11.8 mmHg
Ao pk vel: 1.72 m/s
Area-P 1/2: 3.72 cm2
P 1/2 time: 433 msec
S' Lateral: 2 cm

## 2020-10-08 ENCOUNTER — Encounter: Payer: Self-pay | Admitting: Cardiology

## 2020-10-16 ENCOUNTER — Other Ambulatory Visit: Payer: Self-pay | Admitting: Cardiology

## 2020-10-16 NOTE — Telephone Encounter (Signed)
Pt last saw Dr Mayford Knife 09/14/20, last labs 09/14/20 Creat 1.05, age 79, weight 70.8kg, based on specified criteria pt is on appropriate dosage of Eliquis 5mg  BID.  Will refill rx.

## 2020-11-27 ENCOUNTER — Other Ambulatory Visit: Payer: Self-pay | Admitting: Cardiology

## 2020-11-30 DIAGNOSIS — H35033 Hypertensive retinopathy, bilateral: Secondary | ICD-10-CM | POA: Diagnosis not present

## 2021-03-05 ENCOUNTER — Encounter (HOSPITAL_COMMUNITY): Payer: Self-pay | Admitting: Physician Assistant

## 2021-03-05 ENCOUNTER — Ambulatory Visit (HOSPITAL_COMMUNITY)
Admission: RE | Admit: 2021-03-05 | Discharge: 2021-03-05 | Disposition: A | Discharge status: Home / Self Care | Payer: Medicare Other | Source: Ambulatory Visit | Attending: Physician Assistant | Admitting: Physician Assistant

## 2021-03-05 ENCOUNTER — Other Ambulatory Visit: Payer: Self-pay

## 2021-03-05 VITALS — BP 130/80 | HR 68 | Ht 68.5 in | Wt 158.6 lb

## 2021-03-05 DIAGNOSIS — E785 Hyperlipidemia, unspecified: Secondary | ICD-10-CM | POA: Diagnosis not present

## 2021-03-05 DIAGNOSIS — I480 Paroxysmal atrial fibrillation: Secondary | ICD-10-CM | POA: Diagnosis not present

## 2021-03-05 DIAGNOSIS — Z79899 Other long term (current) drug therapy: Secondary | ICD-10-CM | POA: Insufficient documentation

## 2021-03-05 DIAGNOSIS — Z7901 Long term (current) use of anticoagulants: Secondary | ICD-10-CM | POA: Insufficient documentation

## 2021-03-05 DIAGNOSIS — D6869 Other thrombophilia: Secondary | ICD-10-CM

## 2021-03-05 DIAGNOSIS — Z8249 Family history of ischemic heart disease and other diseases of the circulatory system: Secondary | ICD-10-CM | POA: Diagnosis not present

## 2021-03-05 DIAGNOSIS — I252 Old myocardial infarction: Secondary | ICD-10-CM | POA: Diagnosis not present

## 2021-03-05 DIAGNOSIS — I10 Essential (primary) hypertension: Secondary | ICD-10-CM | POA: Insufficient documentation

## 2021-03-05 NOTE — Progress Notes (Signed)
Primary Care Physician: Tower, Audrie Gallus, MD Primary Cardiologist: Dr Mayford Knife Primary Electrophysiologist: none Referring Physician: [facility]   Latoya Parrish is a 80 y.o. female with a history of NSTEMI secondary to coronary artery vasospasm with normal coronary arteries at cath, HTN, MVP, dyslipidemia, and paroxysmal atrial fibrillation who presents for follow up in the [facility].  The patient was initially diagnosed with atrial fibrillation on 07/15/19 after presenting to the ER with symptoms of palpitations, SOB, and dizziness. During her evaluation in the ER she spontaneously converted to SR and was discharged on Eliquis. She does report she had been out in the heat that day prior to the onset of her symptoms. She denies snoring but does drink some alcohol.   On follow up today, patient reports she has done well since her last visit. She has had very brief palpitations which last < 5 minutes. She denies any bleeding issues on anticoagulation.   Today, she denies symptoms of chest pain, shortness of breath, orthopnea, PND, lower extremity edema, dizziness, presyncope, syncope, snoring, daytime somnolence, bleeding, or neurologic sequela. The patient is tolerating medications without difficulties and is otherwise without complaint today.    Atrial Fibrillation Risk Factors:  she does not have symptoms or diagnosis of sleep apnea. she does not have a history of rheumatic fever. she does have a history of alcohol use. The patient does not have a history of early familial atrial fibrillation or other arrhythmias.  she has a BMI of Body mass index is 23.76 kg/m.Marland Kitchen Filed Weights   03/05/21 0952  Weight: 71.9 kg    Family History  Problem Relation Age of Onset  . Hypertension Father   . COPD Father   . Hypertension Sister      Atrial Fibrillation Management history:  Previous antiarrhythmic drugs: none Previous cardioversions: none Previous  ablations: none CHADS2VASC score: 5 Anticoagulation history: Eliquis   Past Medical History:  Diagnosis Date  . Anemia   . Aortic insufficiency    Mild by echo 09/2020  . Aortic stenosis    mild by echo 09/2019 but not present on echo 09/2020  . Arthritis   . Chicken pox   . Heart attack (HCC) 2006   nontransmural MI seconday to vasispasm w normal coronary arteries  . High cholesterol   . Ischemic dilated cardiomyopathy (HCC)    resolved  . Mitral valve prolapse    with mild MR by echo 10/2015  . White coat hypertension    Past Surgical History:  Procedure Laterality Date  . acoustic neuroma   1991  . ANKLE SURGERY  2009  . CHOLECYSTECTOMY  1990  . facial plastic surgery  1996  . TONSILLECTOMY AND ADENOIDECTOMY  1946    Current Outpatient Medications  Medication Sig Dispense Refill  . Calcium Carbonate-Vitamin D (CALCIUM-VITAMIN D) 500-200 MG-UNIT per tablet Take 1 tablet by mouth daily.     . carvedilol (COREG) 6.25 MG tablet TAKE 1 TABLET BY MOUTH 2 TIMES DAILY. 180 tablet 3  . diltiazem (CARDIZEM) 30 MG tablet Take 1 tablet every 4 hours AS NEEDED for AFIB heart rate >100 45 tablet 1  . ELIQUIS 5 MG TABS tablet TAKE 1 TABLET BY MOUTH 2 TIMES DAILY. 60 tablet 6  . losartan (COZAAR) 50 MG tablet TAKE 1 TABLET BY MOUTH DAILY. 90 tablet 3  . Multiple Vitamin (MULTIVITAMIN WITH MINERALS) TABS tablet Take 1 tablet by mouth daily.    Cliffton Asters Petrolatum-Mineral Oil (  LUBRICANT EYE NIGHTTIME) OINT Apply 1 application to eye at bedtime as needed (dry eyes).    . furosemide (LASIX) 20 MG tablet Take 1 tablet (20 mg total) by mouth daily. 60 tablet 6   No current facility-administered medications for this encounter.    Allergies  Allergen Reactions  . Ace Inhibitors Cough  . Fosamax [Alendronate Sodium]     Dental problems  . Statins     Muscle pain     Social History   Socioeconomic History  . Marital status: Widowed    Spouse name: Not on file  . Number of  children: Not on file  . Years of education: Not on file  . Highest education level: Not on file  Occupational History  . Occupation: Runner, broadcasting/film/video  Tobacco Use  . Smoking status: Never Smoker  . Smokeless tobacco: Never Used  Substance and Sexual Activity  . Alcohol use: Yes    Alcohol/week: 7.0 standard drinks    Types: 7 Glasses of wine per week    Comment: 1 daily  . Drug use: No  . Sexual activity: Not on file  Other Topics Concern  . Not on file  Social History Narrative   Widowed in 2012    Working part time   Social Determinants of Corporate investment banker Strain: Not on file  Food Insecurity: Not on file  Transportation Needs: Not on file  Physical Activity: Not on file  Stress: Not on file  Social Connections: Not on file  Intimate Partner Violence: Not on file     ROS- All systems are reviewed and negative except as per the HPI above.  Physical Exam: Vitals:   03/05/21 0952  BP: 130/80  Pulse: 68  Weight: 71.9 kg  Height: 5' 8.5" (1.74 m)    GEN- The patient is a well appearing elderly female, alert and oriented x 3 today.   HEENT-head normocephalic, atraumatic, sclera clear, conjunctiva pink, hearing intact, trachea midline. Lungs- Clear to ausculation bilaterally, normal work of breathing Heart- Regular rate and rhythm, no murmurs, rubs or gallops  GI- soft, NT, ND, + BS Extremities- no clubbing, cyanosis, or edema MS- no significant deformity or atrophy Skin- no rash or lesion Psych- euthymic mood, full affect Neuro- strength and sensation are intact   Wt Readings from Last 3 Encounters:  03/05/21 71.9 kg  09/14/20 70.8 kg  03/02/20 74.9 kg    EKG today demonstrates  SR Vent. rate 68 BPM PR interval 158 ms QRS duration 90 ms QT/QTcB 430/457 ms  Echo 09/30/19 demonstrated  1. The average left ventricular global longitudinal strain is -18.5 %.  2. Left ventricular ejection fraction, by visual estimation, is 60 to  65%. The left  ventricle has normal function. Left ventricular septal wall  thickness was moderately increased. Moderately increased left ventricular  posterior wall thickness.  3. There is a dynamic LVOT obstruction with a peak gradient of  which incraeses to with Valsalva.  4. Global right ventricle has normal systolic function.The right  ventricular size is normal. No increase in right ventricular wall  thickness.  5. Left atrial size was mildly dilated.  6. Right atrial size was normal.  7. The mitral valve is normal in structure. Trace mitral valve  regurgitation. No evidence of mitral stenosis.  8. The tricuspid valve is normal in structure. Tricuspid valve  regurgitation is trivial.  9. The aortic valve is tricuspid. There is Moderate calcification of the  non coronary cusp. Aortic  valve regurgitation is moderate. There is mild  aortic stenosis.  10. The pulmonic valve was normal in structure. Pulmonic valve  regurgitation is mild.  11. Normal pulmonary artery systolic pressure.  12. The inferior vena cava is normal in size with greater than 50%  respiratory variability, suggesting right atrial pressure of 3 mmHg.  13. .  14. Left ventricular diastolic parameters are consistent with Grade II  diastolic dysfunction (pseudonormalization).  15. Elevated left ventricular end-diastolic pressure.  16. Aortic valve regurgitation is moderate.    Epic records are reviewed at length today   Assessment and Plan:  1. Paroxysmal atrial fibrillation Patient appears to be maintaining SR. Continue Eliquis 5 mg BID Continue diltizem 30 mg PRN for heart racing.  This patients CHA2DS2-VASc Score and unadjusted Ischemic Stroke Rate (% per year) is equal to 7.2 % stroke rate/year from a score of 5  Above score calculated as 1 point each if present [CHF, HTN, DM, Vascular=MI/PAD/Aortic Plaque, Age if 65-74, or Female] Above score calculated as 2 points each if present [Age > 75, or  Stroke/TIA/TE]   2. HTN Stable, no changes today.   Follow up with Dr Mayford Knife per recall, AF clinic in one year.    Jorja Loa PA-C [facility] [address] [place], [place] [address] [phone] 03/05/2021 10:32 AM

## 2021-05-02 ENCOUNTER — Telehealth: Payer: Self-pay | Admitting: Cardiology

## 2021-05-02 DIAGNOSIS — D6869 Other thrombophilia: Secondary | ICD-10-CM

## 2021-05-02 DIAGNOSIS — I480 Paroxysmal atrial fibrillation: Secondary | ICD-10-CM

## 2021-05-02 NOTE — Telephone Encounter (Signed)
Pt is calling in regards to bleeding gums, she states she is on Eliquis and she wants to know what she should do. Pt states her gums have been bleeding since 05/01/21 afternoon

## 2021-05-02 NOTE — Telephone Encounter (Signed)
Left message for patient to call back  

## 2021-05-03 NOTE — Telephone Encounter (Signed)
Spoke with the patient who states that her gums started bleeding on 6/7 and she could not get them to stop. She states that yesterday she went to her dentist and bleeding was due to an issue with her tooth. She states that her dentist was very concerned about the bleeding. She states that she needs have some cleaning done under her gums, however her dentist is not comfortable doing so with the amount that her gums were bleeding yesterday. He advised her to contact us in regards to her dose of Eliquis.  Patient is currently not having anymore bleeding issues. Advised patient to continue on prescribed dose and if any changes need to be made I will call her back.

## 2021-05-03 NOTE — Telephone Encounter (Signed)
Spoke with the patient who states that she weighs around 163lbs. She is scheduled for lab work on Monday 6/13

## 2021-05-03 NOTE — Telephone Encounter (Signed)
Pt is returning call.  

## 2021-05-07 ENCOUNTER — Other Ambulatory Visit: Payer: Medicare Other

## 2021-05-07 ENCOUNTER — Other Ambulatory Visit: Payer: Self-pay

## 2021-05-07 DIAGNOSIS — I480 Paroxysmal atrial fibrillation: Secondary | ICD-10-CM | POA: Diagnosis not present

## 2021-05-07 DIAGNOSIS — D6869 Other thrombophilia: Secondary | ICD-10-CM | POA: Diagnosis not present

## 2021-05-07 LAB — BASIC METABOLIC PANEL
BUN/Creatinine Ratio: 9 — ABNORMAL LOW (ref 12–28)
BUN: 9 mg/dL (ref 8–27)
CO2: 20 mmol/L (ref 20–29)
Calcium: 9.1 mg/dL (ref 8.7–10.3)
Chloride: 104 mmol/L (ref 96–106)
Creatinine, Ser: 1.05 mg/dL — ABNORMAL HIGH (ref 0.57–1.00)
Glucose: 98 mg/dL (ref 65–99)
Potassium: 4.6 mmol/L (ref 3.5–5.2)
Sodium: 139 mmol/L (ref 134–144)
eGFR: 53 mL/min/{1.73_m2} — ABNORMAL LOW (ref >59)

## 2021-05-07 LAB — CBC
Hematocrit: 34.4 % (ref 34.0–46.6)
Hemoglobin: 10.9 g/dL — ABNORMAL LOW (ref 11.1–15.9)
MCH: 27.3 pg (ref 26.6–33.0)
MCHC: 31.7 g/dL (ref 31.5–35.7)
MCV: 86 fL (ref 79–97)
Platelets: 205 10*3/uL (ref 150–450)
RBC: 3.99 x10E6/uL (ref 3.77–5.28)
RDW: 13.1 % (ref 11.7–15.4)
WBC: 7.2 10*3/uL (ref 3.4–10.8)

## 2021-06-11 ENCOUNTER — Other Ambulatory Visit: Payer: Self-pay | Admitting: Cardiology

## 2021-06-11 NOTE — Telephone Encounter (Signed)
Prescription refill request for Eliquis received. Indication: afib  Last office visit: Turner, 09/14/2020 Scr: 1.05, 05/07/2021 Age: 80 yo  Weight: 71.9 kg   Pt is on the correct dose of Eliquis per dosing criteria, prescription refill sent.

## 2021-09-26 ENCOUNTER — Ambulatory Visit: Payer: Medicare Other | Admitting: Cardiology

## 2021-09-26 ENCOUNTER — Other Ambulatory Visit: Payer: Self-pay

## 2021-09-26 ENCOUNTER — Encounter: Payer: Self-pay | Admitting: Cardiology

## 2021-09-26 VITALS — BP 130/72 | HR 81 | Ht 68.0 in | Wt 157.0 lb

## 2021-09-26 DIAGNOSIS — R0989 Other specified symptoms and signs involving the circulatory and respiratory systems: Secondary | ICD-10-CM

## 2021-09-26 DIAGNOSIS — I059 Rheumatic mitral valve disease, unspecified: Secondary | ICD-10-CM

## 2021-09-26 DIAGNOSIS — R0609 Other forms of dyspnea: Secondary | ICD-10-CM

## 2021-09-26 DIAGNOSIS — I10 Essential (primary) hypertension: Secondary | ICD-10-CM

## 2021-09-26 DIAGNOSIS — R040 Epistaxis: Secondary | ICD-10-CM

## 2021-09-26 DIAGNOSIS — R600 Localized edema: Secondary | ICD-10-CM | POA: Diagnosis not present

## 2021-09-26 DIAGNOSIS — I350 Nonrheumatic aortic (valve) stenosis: Secondary | ICD-10-CM

## 2021-09-26 DIAGNOSIS — I480 Paroxysmal atrial fibrillation: Secondary | ICD-10-CM

## 2021-09-26 MED ORDER — CARVEDILOL 6.25 MG PO TABS: 6.2500 mg | ORAL_TABLET | Freq: Two times a day (BID) | ORAL | 3 refills | Status: DC

## 2021-09-26 MED ORDER — APIXABAN 5 MG PO TABS: 5.0000 mg | ORAL_TABLET | Freq: Two times a day (BID) | ORAL | 3 refills | Status: DC

## 2021-09-26 MED ORDER — DILTIAZEM HCL 30 MG PO TABS: ORAL_TABLET | ORAL | 1 refills | Status: AC

## 2021-09-26 MED ORDER — LOSARTAN POTASSIUM 50 MG PO TABS: 50.0000 mg | ORAL_TABLET | Freq: Every day | ORAL | 3 refills | Status: DC

## 2021-09-26 MED ORDER — FUROSEMIDE 20 MG PO TABS
20.0000 mg | ORAL_TABLET | ORAL | 6 refills | Status: AC | PRN
Start: 2021-09-26 — End: 2022-05-24

## 2021-09-26 NOTE — Addendum Note (Signed)
Addended by: Brennen Gardiner on: 12/06/2020 08:38 AM   Modules accepted: Orders  

## 2021-09-26 NOTE — Progress Notes (Addendum)
Cardiology Office Note:    Date:  09/26/2021   ID:  Latoya Parrish, DOB [birth year], MRN [id number]  PCP:  Melida Quitter, MD  Cardiologist:  Armanda Magic, MD    Referring MD: No ref. provider found   Chief Complaint  Patient presents with   Atrial Fibrillation   Aortic Stenosis   Hypertension    History of Present Illness:    Latoya Parrish is a 80 y.o. female with a hx of NSTEMI secondary to coronary artery vasospasm with normal coronary arteries at cath, HTN, PAF on chronic anticoagulation with apixaban and dyslipidemia.    She is here today for followup and is doing well.  She denies any chest pain or pressure, PND, orthopnea, LE edema, dizziness, palpitations or syncope. She has had some DOE since she moved but not very often and has felt better after taking B12.  She is compliant with her meds and is tolerating meds with no SE.     Past Medical History:  Diagnosis Date   Anemia    Aortic insufficiency    Mild by echo 09/2020   Aortic stenosis    mild by echo 09/2019 but not present on echo 09/2020   Arthritis    Chicken pox    Heart attack (HCC) 2006   nontransmural MI seconday to vasispasm w normal coronary arteries   High cholesterol    Ischemic dilated cardiomyopathy (HCC)    resolved   Mitral valve prolapse    with mild MR by echo 10/2015   White coat hypertension     Past Surgical History:  Procedure Laterality Date   acoustic neuroma   1991   ANKLE SURGERY  2009   CHOLECYSTECTOMY  1990   facial plastic surgery  1996   TONSILLECTOMY AND ADENOIDECTOMY  1946    Current Medications: Current Meds  Medication Sig   apixaban (ELIQUIS) 5 MG TABS tablet TAKE 1 TABLET BY MOUTH 2 TIMES DAILY.   Calcium Carbonate-Vitamin D (CALCIUM-VITAMIN D) 500-200 MG-UNIT per tablet Take 1 tablet by mouth daily.    carvedilol (COREG) 6.25 MG tablet TAKE 1 TABLET BY MOUTH 2 TIMES DAILY.   diltiazem (CARDIZEM) 30 MG tablet Take 1 tablet every 4 hours AS NEEDED for AFIB heart  rate >100   diphenhydrAMINE (BENADRYL) 25 MG tablet Take 25 mg by mouth at bedtime as needed.   furosemide (LASIX) 20 MG tablet Take 1 tablet (20 mg total) by mouth daily. (Patient taking differently: Take 20 mg by mouth as needed.)   losartan (COZAAR) 50 MG tablet TAKE 1 TABLET BY MOUTH DAILY.   vitamin B-12 (CYANOCOBALAMIN) 100 MCG tablet    White Petrolatum-Mineral Oil (LUBRICANT EYE NIGHTTIME) OINT Apply 1 application to eye at bedtime as needed (dry eyes).     Allergies:   Ace inhibitors, Fosamax [alendronate sodium], and Statins   Social History   Socioeconomic History   Marital status: Widowed    Spouse name: Not on file   Number of children: Not on file   Years of education: Not on file   Highest education level: Not on file  Occupational History   Occupation: teacher  Tobacco Use   Smoking status: Never   Smokeless tobacco: Never  Substance and Sexual Activity   Alcohol use: Yes    Alcohol/week: 7.0 standard drinks    Types: 7 Glasses of wine per week    Comment: 1 daily   Drug use: No   Sexual activity: Not on file  Other Topics Concern   Not on file  Social History Narrative   Widowed in 2012    Working part time   Social Determinants of Radio broadcast assistant Strain: Not on file  Food Insecurity: Not on file  Transportation Needs: Not on file  Physical Activity: Not on file  Stress: Not on file  Social Connections: Not on file     Family History: The patient's family history includes COPD in her father; Hypertension in her father and sister.  ROS:   Please see the history of present illness.    ROS  All other systems reviewed and negative.   EKGs/Labs/Other Studies Reviewed:    The following studies were reviewed today: none  EKG:  EKG is not ordered today   Recent Labs: 05/07/2021: BUN 9; Creatinine, Ser 1.05; Hemoglobin 10.9; Platelets 205; Potassium 4.6; Sodium 139   Recent Lipid Panel    Component Value Date/Time   CHOL 231 (H)  01/25/2019 1056   TRIG 259.0 (H) 01/25/2019 1056   HDL 48.50 01/25/2019 1056   CHOLHDL 5 01/25/2019 1056   VLDL 51.8 (H) 01/25/2019 1056   LDLCALC 121 (H) 01/04/2016 0907   LDLDIRECT 145.0 01/25/2019 1056    Physical Exam:    VS:  BP 130/72   Pulse 81   Ht 5\' 8"  (1.727 m)   Wt 157 lb (71.2 kg)   SpO2 99%   BMI 23.87 kg/m     Wt Readings from Last 3 Encounters:  09/26/21 157 lb (71.2 kg)  03/05/21 158 lb 9.6 oz (71.9 kg)  09/14/20 156 lb (70.8 kg)    GEN: Well nourished, well developed in no acute distress HEENT: Normal NECK: No JVD; No carotid bruits LYMPHATICS: No lymphadenopathy CARDIAC:RRR, no murmurs, rubs, gallops RESPIRATORY:  Clear to auscultation without rales, wheezing or rhonchi  ABDOMEN: Soft, non-tender, non-distended MUSCULOSKELETAL:  No edema; No deformity  SKIN: Warm and dry NEUROLOGIC:  Alert and oriented x 3 PSYCHIATRIC:  Normal affect   ASSESSMENT:    1. Bilateral leg edema   2. Essential hypertension   3. Mitral valve disorder   4. Nonrheumatic aortic valve stenosis   5. Paroxysmal atrial fibrillation (HCC)   6. DOE (dyspnea on exertion)   7. Bruit of left carotid artery    PLAN:    In order of problems listed above:  1.  LE edema -Her lower extremity edema is very well controlled on as needed diuretics -Continue prescription drug management with Lasix 20 mg as needed for lower extremity edema -continue to follow low Na diet  2.  HTN -BP is adequately controlled on exam today -Continue prescription drug management with carvedilol 6.25 mg twice daily and losartan 50 mg daily with as needed refills  -I have personally reviewed and interpreted outside labs performed by patient's PCP which showed serum creatinine 1.05, potassium 4.6 on 05/07/2021  3.  MVP -no prolapse on last echo 11/2017  4.  Aortic stenosis and AI -mild AS and moderate AI on echo 09/2019 -repeat echo 10/14/2020 showed no aortic stenosis and mild AI  5.  PAF -She is  maintaining normal sinus rhythm on exam with no complaints of palpitations -continue scription drug management with Eliquis 5 mg twice daily, carvedilol 6.25 mg twice daily and as needed Cardizem with as needed refills  -she says that she gets nosebleeds about once monthly lasting a few hours at a time -refer to ENT for epistaxis -I have personally reviewed and interpreted outside labs  performed by patient's PCP which showed hemoglobin 10.9 on 10/14/2020  6.  DOE -this is new and has started to occur with exertion including house work>>she thinks it is related to dehydration -I will get a 2D echo to assess LVF -check Lexiscan myoview to rule out ischemia -Shared Decision Making/Informed Consent The risks [chest pain, shortness of breath, cardiac arrhythmias, dizziness, blood pressure fluctuations, myocardial infarction, stroke/transient ischemic attack, nausea, vomiting, allergic reaction, radiation exposure, metallic taste sensation and life-threatening complications (estimated to be 1 in 10,000)], benefits (risk stratification, diagnosing coronary artery disease, treatment guidance) and alternatives of a nuclear stress test were discussed in detail with Ms. Helmly and she agrees to proceed.  7.  Left carotid bruit -this is very faint -I will get carotid dopplers to assess   Medication Adjustments/Labs and Tests Ordered: Current medicines are reviewed at length with the patient today.  Concerns regarding medicines are outlined above.  No orders of the defined types were placed in this encounter.  No orders of the defined types were placed in this encounter.   Signed, Fransico Him, MD  09/26/2021 8:33 AM    [place]

## 2021-09-26 NOTE — Addendum Note (Signed)
Addended by: Quintella Reichert on: 09/26/2021 08:42 AM   Modules accepted: Orders

## 2021-09-26 NOTE — Patient Instructions (Signed)
Medication Instructions:  Your physician recommends that you continue on your current medications as directed. Please refer to the Current Medication list given to you today.  *If you need a refill on your cardiac medications before your next appointment, please call your pharmacy*  Testing/Procedures: Your physician has requested that you have an echocardiogram. Echocardiography is a painless test that uses sound waves to create images of your heart. It provides your doctor with information about the size and shape of your heart and how well your heart's chambers and valves are working. This procedure takes approximately one hour. There are no restrictions for this procedure.  Your physician has requested that you have a lexiscan myoview. For further information please visit [email or web address] Please follow instruction sheet, as given.\  Your physician has requested that you have a carotid duplex. This test is an ultrasound of the carotid arteries in your neck. It looks at blood flow through these arteries that supply the brain with blood. Allow one hour for this exam. There are no restrictions or special instructions.  Follow-Up: At [facility], you and your health needs are our priority.  As part of our continuing mission to provide you with exceptional heart care, we have created designated Provider Care Teams.  These Care Teams include your primary Cardiologist (physician) and Advanced Practice Providers (APPs -  Physician Assistants and Nurse Practitioners) who all work together to provide you with the care you need, when you need it.  Your next appointment:   1 year(s)  The format for your next appointment:   In Person  Provider:   You may see Armanda Magic, MD or one of the following Advanced Practice Providers on your designated Care Team:   Ronie Spies, PA-C Jacolyn Reedy, PA-C  Other Instructions You have been referred to an Ear, Nose and Throat Specialist.

## 2021-09-27 ENCOUNTER — Telehealth (HOSPITAL_COMMUNITY): Payer: Self-pay

## 2021-09-27 NOTE — Telephone Encounter (Signed)
Detailed instructions left on the patient's answering machine. Asked to call back with any questions. S.Magenta Schmiesing EMTP 

## 2021-10-02 ENCOUNTER — Encounter (HOSPITAL_COMMUNITY): Payer: Medicare Other

## 2021-10-05 ENCOUNTER — Ambulatory Visit (HOSPITAL_COMMUNITY)
Admission: RE | Admit: 2021-10-05 | Discharge: 2021-10-05 | Disposition: A | Discharge status: Home / Self Care | Payer: Medicare Other | Source: Ambulatory Visit | Attending: Cardiology | Admitting: Cardiology

## 2021-10-05 ENCOUNTER — Other Ambulatory Visit: Payer: Self-pay

## 2021-10-05 DIAGNOSIS — R0989 Other specified symptoms and signs involving the circulatory and respiratory systems: Secondary | ICD-10-CM | POA: Diagnosis present

## 2021-10-16 ENCOUNTER — Telehealth (HOSPITAL_COMMUNITY): Payer: Self-pay | Admitting: Cardiology

## 2021-10-16 NOTE — Telephone Encounter (Signed)
I called to schedule Myoview that we received PA# from the insurance for. Approval was granted thru 11/20/21. Patient declined to schedule at this time and states she will wait til Jan 2023 to schedule and will call me when she is ready. Order will be removed  from the [facility] and when patient calls back to schedule we will reinstate the order.

## 2021-10-23 ENCOUNTER — Other Ambulatory Visit: Payer: Self-pay

## 2021-10-23 ENCOUNTER — Ambulatory Visit (HOSPITAL_COMMUNITY): Payer: Medicare Other | Attending: Cardiology

## 2021-10-23 DIAGNOSIS — I480 Paroxysmal atrial fibrillation: Secondary | ICD-10-CM

## 2021-10-23 DIAGNOSIS — I350 Nonrheumatic aortic (valve) stenosis: Secondary | ICD-10-CM | POA: Diagnosis present

## 2021-10-23 DIAGNOSIS — I059 Rheumatic mitral valve disease, unspecified: Secondary | ICD-10-CM | POA: Diagnosis not present

## 2021-10-23 DIAGNOSIS — R0609 Other forms of dyspnea: Secondary | ICD-10-CM

## 2021-10-23 LAB — ECHOCARDIOGRAM COMPLETE
AR max vel: 4.83 cm2
AV Area VTI: 4.92 cm2
AV Area mean vel: 4.94 cm2
AV Mean grad: 8 mmHg
AV Peak grad: 14.9 mmHg
Ao pk vel: 1.93 m/s
Area-P 1/2: 3.06 cm2
P 1/2 time: 436 msec
S' Lateral: 2.4 cm

## 2021-11-15 NOTE — Progress Notes (Addendum)
Office Visit    Patient Name: Latoya Parrish Date of Encounter: 11/16/2021  PCP:  Melida Quitter, MD   [facility]  Cardiologist:  Armanda Magic, MD  Advanced Practice Provider:  No care team member to display Electrophysiologist:  None    Chief Complaint    Latoya Parrish is a 80 y.o. female with a hx of NSTEMI secondary to coronary artery vasospasm with normal coronary arteries, hypertension, PAF on chronic anticoagulation with apixaban, and dyslipidemia presents today for follow-up visit to discuss TEE.   Past Medical History    Past Medical History:  Diagnosis Date   Anemia    Aortic insufficiency    Mild by echo 09/2020   Aortic stenosis    mild by echo 09/2019 but not present on echo 09/2020   Arthritis    Chicken pox    Heart attack (HCC) 2006   nontransmural MI seconday to vasispasm w normal coronary arteries   High cholesterol    Ischemic dilated cardiomyopathy (HCC)    resolved   Mitral valve prolapse    with mild MR by echo 10/2015   White coat hypertension    Past Surgical History:  Procedure Laterality Date   acoustic neuroma   1991   ANKLE SURGERY  2009   CHOLECYSTECTOMY  1990   facial plastic surgery  1996   TONSILLECTOMY AND ADENOIDECTOMY  1946    Allergies  Allergies  Allergen Reactions   Ace Inhibitors Cough   Fosamax [Alendronate Sodium]     Dental problems   Statins     Muscle pain     History of Present Illness    Latoya Parrish is a 80 y.o. female with a hx of NSTEMI secondary to coronary artery vasospasm with normal coronary arteries, hypertension, PAF on chronic anticoagulation with apixaban, and dyslipidemia presents today for follow-up visit to discuss TEE last seen 09/26/2021.  She presented to her last appointment with some DOE but it was not happening very often.  She was feeling better taking B12.  She was compliant with all her medications and tolerating her medications without side effects.  She  denied chest pain or pressure, PND, orthopnea, LE edema, dizziness, palpitations, or syncope.  She did have a very faint carotid bruit and a carotid ultrasound was ordered.  Planning for 2D echocardiogram to assess LVF given DOE.  Today, she has been experiencing more fatigue with SOB. Pressure and hurting across shoulders with activity. She never had to take medication for elevated heart rate. She has been experiencing some issues with swallowing. She is a little concerned with these issues with the TEE planned. We went over the procedure and I told the patient I would reach out to Dr. Mayford Knife and ask if any additional studies on her esophagus need to be done. We also discussed scheduling the myoview for evaluation of her exertional SOB and pain across her shoulders. She does not endorse chest pain but she does say her shoulder pain is exertional. We also discussed compression hose and provided an elastic therapy sheet for her to call with her measurements and order. She has been taking her lasix about three times a week for lower extremity edema. She notices it more at the end of the day and this is why I think compression socks/hose would assist with her LE edema. We discussed the lexiscan myoview and the risks associated. She does endorse continued nose bleeds about twice a month. She did  contact an ENT and is waiting for their office to get back to her for an appointment. We will order a CBC today to evaluate her hemoglobin since it has not been looked at since June. She can continue to hold her Eliquis the day before dental procedures.    Reports no palpitations.     EKGs/Labs/Other Studies Reviewed:   The following studies were reviewed today:  10/05/2021 VAS Carotid Summary:  Right Carotid: The extracranial vessels were near-normal with only minimal  wall                thickening or plaque.   Left Carotid: The extracranial vessels were near-normal with only minimal  wall                thickening or plaque.   Vertebrals:  Bilateral vertebral arteries demonstrate antegrade flow.  Subclavians: Normal flow hemodynamics were seen in bilateral subclavian               arteries.   Echocardiogram 10/23/2021 IMPRESSIONS     1. Calcified ridge left ventricular outflow tract just prior to aortic  valve. Turbulent flow in this region noted. Peak velocity 3.8 m/s with  peak gradient of 57 mmHg during Valsalva.. Left ventricular ejection  fraction, by estimation, is 65 to 70%. The   left ventricle has normal function. The left ventricle has no regional  wall motion abnormalities. There is moderate left ventricular hypertrophy.  Left ventricular diastolic parameters are consistent with Grade I  diastolic dysfunction (impaired  relaxation).   2. Right ventricular systolic function is normal. The right ventricular  size is normal. There is normal pulmonary artery systolic pressure. The  estimated right ventricular systolic pressure is 31.7 mmHg.   3. Left atrial size was moderately dilated.   4. The mitral valve is normal in structure. Mild mitral valve  regurgitation. No evidence of mitral stenosis.   5. Tricuspid valve regurgitation is moderate.   6. The aortic valve is normal in structure. There is moderate  calcification of the aortic valve. There is moderate thickening of the  aortic valve. Aortic valve regurgitation is moderate. Aortic valve  sclerosis is present, with no evidence of aortic  valve stenosis. Aortic valve mean gradient measures 8.0 mmHg. Aortic valve  Vmax measures 1.93 m/s.   7. Pulmonic valve regurgitation is moderate.   8. The inferior vena cava is normal in size with greater than 50%  respiratory variability, suggesting right atrial pressure of 3 mmHg.   Comparison(s): Prior images reviewed side by side. Peak velocity 3.4 m/s  with peak gradient of 47 mmHg during Valsalva. Mild outflow tract gradient  noted. Aortic regurgitation has advanced, now  moderate.   EKG:  EKG is ordered today.  The ekg ordered today demonstrates NSR in 81 bpm   Recent Labs: 05/07/2021: BUN 9; Creatinine, Ser 1.05; Hemoglobin 10.9; Platelets 205; Potassium 4.6; Sodium 139  Recent Lipid Panel    Component Value Date/Time   CHOL 231 (H) 01/25/2019 1056   TRIG 259.0 (H) 01/25/2019 1056   HDL 48.50 01/25/2019 1056   CHOLHDL 5 01/25/2019 1056   VLDL 51.8 (H) 01/25/2019 1056   LDLCALC 121 (H) 01/04/2016 0907   LDLDIRECT 145.0 01/25/2019 1056    Home Medications   Current Meds  Medication Sig   apixaban (ELIQUIS) 5 MG TABS tablet Take 1 tablet (5 mg total) by mouth 2 (two) times daily.   Calcium Carbonate-Vitamin D (CALCIUM-VITAMIN D) 500-200 MG-UNIT per tablet Take 1  tablet by mouth daily.    carvedilol (COREG) 6.25 MG tablet Take 1 tablet (6.25 mg total) by mouth 2 (two) times daily.   diltiazem (CARDIZEM) 30 MG tablet Take 1 tablet every 4 hours AS NEEDED for AFIB heart rate >100   diphenhydrAMINE (BENADRYL) 25 MG tablet Take 25 mg by mouth at bedtime as needed.   furosemide (LASIX) 20 MG tablet Take 1 tablet (20 mg total) by mouth as needed.   losartan (COZAAR) 50 MG tablet Take 1 tablet (50 mg total) by mouth daily.   vitamin B-12 (CYANOCOBALAMIN) 100 MCG tablet    White Petrolatum-Mineral Oil (LUBRICANT EYE NIGHTTIME) OINT Apply 1 application to eye at bedtime as needed (dry eyes).     Review of Systems      All other systems reviewed and are otherwise negative except as noted above.  Physical Exam    VS:  There were no vitals taken for this visit. , BMI There is no height or weight on file to calculate BMI.  Wt Readings from Last 3 Encounters:  09/26/21 157 lb (71.2 kg)  03/05/21 158 lb 9.6 oz (71.9 kg)  09/14/20 156 lb (70.8 kg)     GEN: Well nourished, well developed, in no acute distress. HEENT: normal. Neck: Supple, no JVD, carotid bruits, or masses. Cardiac: RRR, + systolic murmur, rubs, or gallops. No clubbing, cyanosis, edema.   Radials/PT 2+ and equal bilaterally.  Respiratory:  Respirations regular and unlabored, clear to auscultation bilaterally. GI: Soft, nontender, nondistended. MS: No deformity or atrophy. Skin: Warm and dry, no rash. Neuro:  Strength and sensation are intact. Psych: Normal affect.  Assessment & Plan    LE edema -Her lower extremity edema is well controlled on her current diuretic regimen -Continue prescription drug management with Lasix 20 mg as needed for lower extremity edema -She does need to take her lasix about three times a week depending on sodium intake -Continue low-sodium diet -Compression hose handout provided  HTN -BP slightly elevated in the clinic today -Continue to monitor your blood pressure at home -Continue carvedilol 6.25 mg twice daily and losartan 50 mg daily -Labs from 04/2021 stable -At home it usually runs 120-130s/70s   MVP  -No prolapse seen on last echocardiogram 11/2017  AS and AI -Mild AS and moderate AI on echocardiogram 09/2019 -Repeat echocardiogram 09/2020 showed no aortic stenosis and mild AI -Echocardiogram 10/14/2021 showed no aortic stenosis and moderate AI -Will order a TEE  PAF -She is maintaining normal sinus rhythm on exam with no complaints of palpitations -Continue carvedilol 6.25 mg twice daily and as needed Cardizem -Referred to ENT for epistaxis-acoustic neuroma, left nostril. Lasts an hour. It happens about twice a month -Will order another CBC today since she continues to have nose bleeds.  DOE -This is new as of a month ago and started to occur with exertion including housework.  She believes it to be due to dehydration. She tried to drink 3-4 tall glasses over a days time.  -We will arrange an outpatient TEE -Needs Lexiscan myoview for ischemic workup, this was ordered today  Left carotid bruit -no significant plaque on carotid US -No bruit on exam  Disposition: Follow up in 2 month(s) with Armanda Magic, MD or  APP.  Signed, Sharlene Dory, PA-C 11/16/2021, 11:16 AM [facility]

## 2021-11-16 ENCOUNTER — Ambulatory Visit (HOSPITAL_BASED_OUTPATIENT_CLINIC_OR_DEPARTMENT_OTHER): Payer: Medicare Other | Admitting: Physician Assistant

## 2021-11-16 ENCOUNTER — Other Ambulatory Visit: Payer: Self-pay

## 2021-11-16 ENCOUNTER — Encounter (HOSPITAL_BASED_OUTPATIENT_CLINIC_OR_DEPARTMENT_OTHER): Payer: Self-pay | Admitting: Physician Assistant

## 2021-11-16 ENCOUNTER — Encounter (HOSPITAL_BASED_OUTPATIENT_CLINIC_OR_DEPARTMENT_OTHER): Payer: Self-pay | Admitting: Cardiology

## 2021-11-16 VITALS — BP 150/80 | HR 81 | Ht 68.0 in | Wt 156.0 lb

## 2021-11-16 DIAGNOSIS — R0609 Other forms of dyspnea: Secondary | ICD-10-CM

## 2021-11-16 DIAGNOSIS — I350 Nonrheumatic aortic (valve) stenosis: Secondary | ICD-10-CM

## 2021-11-16 DIAGNOSIS — R040 Epistaxis: Secondary | ICD-10-CM

## 2021-11-16 DIAGNOSIS — R600 Localized edema: Secondary | ICD-10-CM

## 2021-11-16 DIAGNOSIS — R0989 Other specified symptoms and signs involving the circulatory and respiratory systems: Secondary | ICD-10-CM

## 2021-11-16 DIAGNOSIS — I059 Rheumatic mitral valve disease, unspecified: Secondary | ICD-10-CM | POA: Diagnosis not present

## 2021-11-16 DIAGNOSIS — I10 Essential (primary) hypertension: Secondary | ICD-10-CM

## 2021-11-16 DIAGNOSIS — I341 Nonrheumatic mitral (valve) prolapse: Secondary | ICD-10-CM

## 2021-11-16 NOTE — Patient Instructions (Addendum)
Medication Instructions:  Your Physician recommend you continue on your current medication as directed.    *If you need a refill on your cardiac medications before your next appointment, please call your pharmacy*   Lab Work: Your physician recommends that you return for lab work today for CBC.  Your physician recommends that you return for lab work 1 week prior to your TEE for BMP, CBC. You do not need to be fasting. You may have this done 12/05/21-12/07/21  If you have labs (blood work) drawn today and your tests are completely normal, you will receive your results only by: [place] (if you have MyChart) OR A paper copy in the mail If you have any lab test that is abnormal or we need to change your treatment, we will call you to review the results.   Testing/Procedures: Your physician has requested that you have a TEE. During a TEE, sound waves are used to create images of your heart. It provides your doctor with information about the size and shape of your heart and how well your hearts chambers and valves are working. In this test, a transducer is attached to the end of a flexible tube thats guided down your throat and into your esophagus (the tube leading from you mouth to your stomach) to get a more detailed image of your heart. You are not awake for the procedure. Please see the instruction sheet given to you today.    Your physician has requested that you have a lexiscan myoview. Please follow instruction sheet, as given.   Follow-Up: At [facility], you and your health needs are our priority.  As part of our continuing mission to provide you with exceptional heart care, we have created designated Provider Care Teams.  These Care Teams include your primary Cardiologist (physician) and Advanced Practice Providers (APPs -  Physician Assistants and Nurse Practitioners) who all work together to provide you with the care you need, when you need it.  We recommend signing up for  the patient portal called "MyChart".  Sign up information is provided on this After Visit Summary.  MyChart is used to connect with patients for Virtual Visits (Telemedicine).  Patients are able to view lab/test results, encounter notes, upcoming appointments, etc.  Non-urgent messages can be sent to your provider as well.   To learn more about what you can do with MyChart, go to [email or web address].    Your next appointment:   6-8 month(s)  The format for your next appointment:   In Person  Provider:   Fransico Him, MD or Advanced Practice Provider   Other Instructions   You are scheduled for a TEE on January 18th, 2023 with Dr. Radford Pax.  Please arrive at the [facility] (Main Entrance A) at [facility]: [place], [place] [address] at 9 am. (1 hour prior to procedure)  DIET: Nothing to eat or drink after midnight except a sip of water with medications (see medication instructions below)  FYI: For your safety, and to allow [place] to monitor your vital signs accurately during the surgery/procedure we request that   if you have artificial nails, gel coating, SNS etc. Please have those removed prior to your surgery/procedure. Not having the nail coverings /polish removed may result in cancellation or delay of your surgery/procedure.  Medication Instructions: Hold Furosemide  Continue your anticoagulant: Eliquis You will need to continue your anticoagulant after your procedure until you  are told by your  Provider that it is  safe to stop   Labs: Come to [facility] on the 3rd floor or [facility] for BMP, CBC one week prior to the procedure.   You must have a responsible person to drive you home and stay in the waiting area during your procedure. Failure to do so could result in cancellation.  Bring your insurance cards.  *Special Note: Every effort is made to have your procedure done on time. Occasionally there are emergencies that occur at the hospital that  may cause delays. Please be patient if a delay does occur.   Transesophageal Echocardiogram Transesophageal echocardiogram (TEE) is a test that uses sound waves to take pictures of your heart. TEE is done by passing a small probe attached to a flexible tube down the part of the body that moves food from your mouth to your stomach (esophagus). The pictures give clear images of your heart. This can help your doctor see if there are problems with your heart. Tell a doctor about: Any allergies you have. All medicines you are taking. This includes vitamins, herbs, eye drops, creams, and over-the-counter medicines. Any problems you or family members have had with anesthetic medicines. Any blood disorders you have. Any surgeries you have had. Any medical conditions you have. Any swallowing problems. Whether you have or have had a blockage in the part of the body that moves food from your mouth to your stomach. Whether you are pregnant or may be pregnant. What are the risks? In general, this is a safe procedure. But, problems may occur, such as: Damage to nearby structures or organs. A tear in the part of the body that moves food from your mouth to your stomach. Irregular heartbeat. Hoarse voice or trouble swallowing. Bleeding. What happens before the procedure? Medicines Ask your doctor about changing or stopping: Your normal medicines. Vitamins, herbs, and supplements. Over-the-counter medicines. Do not take aspirin or ibuprofen unless you are told to. General instructions Follow instructions from your doctor about what you cannot eat or drink. You will take out any dentures or dental retainers. Plan to have a responsible adult take you home from the hospital or clinic. Plan to have a responsible adult care for you for the time you are told after you leave the hospital or clinic. This is important. What happens during the procedure?  An IV will be put into one of your veins. You may be  given: A sedative. This medicine helps you relax. A medicine to numb the back of your throat. This may be sprayed or gargled. Your blood pressure, heart rate, and breathing will be watched. You may be asked to lie on your left side. A bite block will be placed in your mouth. This keeps you from biting the tube. The tip of the probe will be placed into the back of your mouth. You will be asked to swallow. Your doctor will take pictures of your heart. The probe and bite block will be taken out after the test is done. The procedure may vary among doctors and hospitals. What can I expect after the procedure? You will be monitored until you leave the hospital or clinic. This includes checking your blood pressure, heart rate, breathing rate, and blood oxygen level. Your throat may feel sore and numb. This will get better over time. You will not be allowed to eat or drink until the numbness has gone away. It is common to have a sore throat for a day or two. It is up to you to get  the results of your procedure. Ask how to get your results when they are ready. Follow these instructions at home: If you were given a sedative during your procedure, do not drive or use machines until your doctor says that it is safe. Return to your normal activities when your doctor says that it is safe. Keep all follow-up visits. Summary TEE is a test that uses sound waves to take pictures of your heart. You will be given a medicine to help you relax. Do not drive or use machines until your doctor says that it is safe. This information is not intended to replace advice given to you by your health care provider. Make sure you discuss any questions you have with your health care provider. Document Revised: 07/25/2021 Document Reviewed: 07/04/2020 Elsevier Patient Education  2022 [employer].

## 2021-11-20 LAB — CBC
Hematocrit: 33.2 % — ABNORMAL LOW (ref 34.0–46.6)
Hemoglobin: 10.6 g/dL — ABNORMAL LOW (ref 11.1–15.9)
MCH: 27 pg (ref 26.6–33.0)
MCHC: 31.9 g/dL (ref 31.5–35.7)
MCV: 85 fL (ref 79–97)
Platelets: 228 10*3/uL (ref 150–450)
RBC: 3.92 x10E6/uL (ref 3.77–5.28)
RDW: 13 % (ref 11.7–15.4)
WBC: 11.7 10*3/uL — ABNORMAL HIGH (ref 3.4–10.8)

## 2021-11-21 ENCOUNTER — Telehealth (HOSPITAL_COMMUNITY): Payer: Self-pay | Admitting: *Deleted

## 2021-11-21 NOTE — Telephone Encounter (Signed)
Patient given detailed instructions per Myocardial Perfusion Study Information Sheet for the test on 11/28/2021 at 8:15. Patient notified to arrive 15 minutes early and that it is imperative to arrive on time for appointment to keep from having the test rescheduled.  If you need to cancel or reschedule your appointment, please call the office within 24 hours of your appointment. . Patient verbalized understanding.[name]

## 2021-11-27 NOTE — Progress Notes (Signed)
Seen by patient ML L Luhmann on 11/24/2021  4:00 PM

## 2021-11-27 NOTE — Addendum Note (Signed)
Addended by: Sharlene Dory on: 11/27/2021 07:04 PM   Modules accepted: Orders

## 2021-11-28 ENCOUNTER — Telehealth (HOSPITAL_COMMUNITY): Payer: Self-pay | Admitting: *Deleted

## 2021-11-28 ENCOUNTER — Ambulatory Visit (HOSPITAL_COMMUNITY): Payer: Medicare Other

## 2021-11-28 NOTE — Telephone Encounter (Signed)
Patient given detailed instructions per Myocardial Perfusion Study Information Sheet for the test on 12/05/21 at 1045. Patient notified to arrive 15 minutes early and that it is imperative to arrive on time for appointment to keep from having the test rescheduled.  If you need to cancel or reschedule your appointment, please call the office within 24 hours of your appointment. . Patient verbalized understanding.Latoya Parrish, Latoya Parrish

## 2021-12-03 ENCOUNTER — Encounter (HOSPITAL_COMMUNITY): Payer: Self-pay | Admitting: Cardiology

## 2021-12-03 ENCOUNTER — Telehealth (HOSPITAL_COMMUNITY): Payer: Self-pay | Admitting: Cardiology

## 2021-12-03 NOTE — Telephone Encounter (Signed)
Patient called and cancelled her Myoview and states she just not able to do and this time due to she is still feeling really bad.  She wants to wait to talk to PCP and Dr. Mayford Knife before rescheduling. Order will be removed from the active WQ.  When patient calls back to reschedule we will reinstate the order. Thank you.

## 2021-12-03 NOTE — Progress Notes (Signed)
Attempted to obtain medical history via telephone, unable to reach at this time. I left a voicemail to return pre surgical testing department's phone call.  

## 2021-12-03 NOTE — Telephone Encounter (Signed)
Spoke with the patient who states that she has been sick since last Wednesday. She has sinus drainage, fever, cough, sore throat, and chest congestion. She has tested negative for COVID. She is following up with her PCP. Advised patient to call back when she feels better to reschedule her stress test. Patient verbalized understanding.

## 2021-12-05 ENCOUNTER — Encounter (HOSPITAL_COMMUNITY): Payer: Medicare Other

## 2021-12-08 ENCOUNTER — Other Ambulatory Visit: Payer: Self-pay

## 2021-12-08 ENCOUNTER — Emergency Department (HOSPITAL_BASED_OUTPATIENT_CLINIC_OR_DEPARTMENT_OTHER)
Admission: EM | Admit: 2021-12-08 | Discharge: 2021-12-08 | Disposition: A | Discharge status: Home / Self Care | Payer: Medicare Other | Attending: Emergency Medicine | Admitting: Emergency Medicine

## 2021-12-08 DIAGNOSIS — Z79899 Other long term (current) drug therapy: Secondary | ICD-10-CM | POA: Diagnosis not present

## 2021-12-08 DIAGNOSIS — H6121 Impacted cerumen, right ear: Secondary | ICD-10-CM | POA: Diagnosis not present

## 2021-12-08 DIAGNOSIS — I10 Essential (primary) hypertension: Secondary | ICD-10-CM | POA: Diagnosis not present

## 2021-12-08 DIAGNOSIS — R42 Dizziness and giddiness: Secondary | ICD-10-CM

## 2021-12-08 MED ORDER — ONDANSETRON 4 MG PO TBDP
4.0000 mg | ORAL_TABLET | Freq: Once | ORAL | Status: AC
  Administered 2021-12-08: 4 mg via ORAL
  Filled 2021-12-08: qty 1

## 2021-12-08 MED ORDER — MECLIZINE HCL 25 MG PO TABS: 25.0000 mg | ORAL_TABLET | Freq: Three times a day (TID) | ORAL | 0 refills | Status: DC | PRN

## 2021-12-08 MED ORDER — ONDANSETRON 4 MG PO TBDP: 4.0000 mg | ORAL_TABLET | Freq: Three times a day (TID) | ORAL | 0 refills | Status: DC | PRN

## 2021-12-08 NOTE — ED Notes (Signed)
Pt declined temperature, she is feeling nauseated

## 2021-12-08 NOTE — ED Notes (Signed)
Dc instructions reviewed with patient. Patient voiced understanding. Dc with belongings.  °

## 2021-12-08 NOTE — ED Provider Notes (Signed)
[facility] EMERGENCY DEPT Provider Note   CSN: [id number] Arrival date & time: 12/08/21  [id number]     History  Chief Complaint  Patient presents with   hearing loss/vomiting    Latoya Parrish is a 81 y.o. female.  Patient is an 81 year old female with a history of anemia, ischemic dilated cardiomyopathy, hypertension, aortic insufficiency, aortic stenosis and prior history of a neuroma in the left ear with hearing loss for greater than 20 years who is presenting today with complaint of hearing loss in her right ear starting today when she woke up, feeling dizzy and nauseated.  She was having some vomiting at home when she woke up this morning.  She called her daughter and was just talking in the receiver but reported she could not hear anything.  She is not having any pain in her ear but still is complaining of some mild nausea and dizziness especially when she moves her head.  She has no abdominal pain.  She does report for the last few weeks she has had URI symptoms.  She has had coughing, myalgias, sore throat.  She saw a PA at her doctor's office 3 days ago and at that time had an x-ray that was negative for pneumonia.  She was started on cefdinir which she has been taking since the 11th and had felt better for Thursday and yesterday but then woke up this morning with the symptoms.  She denies ever having symptoms like this before.  The history is provided by the patient and a relative.      Home Medications Prior to Admission medications   Medication Sig Start Date End Date Taking? Authorizing Provider  apixaban (ELIQUIS) 5 MG TABS tablet Take 1 tablet (5 mg total) by mouth 2 (two) times daily. 09/26/21   Sueanne Margarita, MD  Calcium Carbonate-Vitamin D (CALCIUM-VITAMIN D) 500-200 MG-UNIT per tablet Take 1 tablet by mouth daily.     [provider]  carvedilol (COREG) 6.25 MG tablet Take 1 tablet (6.25 mg total) by mouth 2 (two) times daily. 09/26/21   Sueanne Margarita, MD  diltiazem (CARDIZEM) 30 MG tablet Take 1 tablet every 4 hours AS NEEDED for AFIB heart rate >100 09/26/21   Turner, Eber Hong, MD  diphenhydrAMINE (BENADRYL) 25 MG tablet Take 25 mg by mouth at bedtime as needed.    [provider]  furosemide (LASIX) 20 MG tablet Take 1 tablet (20 mg total) by mouth as needed. 09/26/21 05/24/22  Sueanne Margarita, MD  losartan (COZAAR) 50 MG tablet Take 1 tablet (50 mg total) by mouth daily. 09/26/21   Sueanne Margarita, MD  vitamin B-12 (CYANOCOBALAMIN) 100 MCG tablet     [provider]  White Petrolatum-Mineral Oil (LUBRICANT EYE NIGHTTIME) OINT Apply 1 application to eye at bedtime as needed (dry eyes).    [provider]      Allergies    Ace inhibitors, Fosamax [alendronate sodium], and Statins    Review of Systems   Review of Systems  Physical Exam Updated Vital Signs BP (!) 187/61 (BP Location: Right Arm)    Pulse 63    Temp 98.3 F (36.8 C) (Oral)    Resp 15    SpO2 97%  Physical Exam Vitals and nursing note reviewed.  Constitutional:      General: She is not in acute distress.    Appearance: She is well-developed.  HENT:     Head: Normocephalic and atraumatic.  Right Ear: There is impacted cerumen.     Left Ear: Tympanic membrane normal.  Eyes:     Pupils: Pupils are equal, round, and reactive to light.     Comments: Nystagmus noted  Cardiovascular:     Rate and Rhythm: Normal rate and regular rhythm.     Heart sounds: Normal heart sounds. No murmur heard.   No friction rub.  Pulmonary:     Effort: Pulmonary effort is normal.     Breath sounds: Normal breath sounds. No wheezing or rales.  Abdominal:     General: Bowel sounds are normal. There is no distension.     Palpations: Abdomen is soft.     Tenderness: There is no abdominal tenderness. There is no guarding or rebound.  Musculoskeletal:        General: No tenderness. Normal range of motion.     Cervical back: Normal range of motion and neck  supple.     Right lower leg: No edema.     Left lower leg: No edema.     Comments: No edema  Skin:    General: Skin is warm and dry.     Findings: No rash.  Neurological:     Mental Status: She is alert and oriented to person, place, and time. Mental status is at baseline.     Sensory: No sensory deficit.     Motor: No weakness.     Comments: Left sided facial droop  Psychiatric:        Mood and Affect: Mood normal.        Behavior: Behavior normal.    ED Results / Procedures / Treatments   Labs (all labs ordered are listed, but only abnormal results are displayed) Labs Reviewed - No data to display  EKG None  Radiology No results found.  Procedures Procedures    Medications Ordered in ED Medications  ondansetron (ZOFRAN-ODT) disintegrating tablet 4 mg (has no administration in time range)    ED Course/ Medical Decision Making/ A&P                           Medical Decision Making  Patient presenting today with complaint of inability to hear from her right ear which is the only ear she has hearing from as well as dizziness and vomiting.  Patient's symptoms do seem classic for BPV but also patient has cerumen impaction in her right ear.  We will flush the ear for further evaluation.  Also patient has had URI symptoms over the last few weeks and is currently on cefdinir.  Low suspicion for stroke today.  Also low suspicion for an acute abdominal process as patient has no abdominal pain, benign abdominal exam.  Will reevaluate neuro exam once ear is flushed and hoping that patient's hearing will be much improved.  She already had a chest x-ray at her PCP office 2 days ago that was negative for acute findings  11:53 AM Large amount of cerumen removed from the right ear.  Patient reports her hearing is not normal but is improved.  She still is having some dizziness but that is improved as well.  Patient was treated with meclizine and Zofran.  She was given return precautions.   Her daughter and her questions were answered.  She does not appear to have any social barriers preventing her discharge.        Final Clinical Impression(s) / ED Diagnoses Final diagnoses:  Impacted cerumen  of right ear  Vertigo    Rx / DC Orders ED Discharge Orders          Ordered    meclizine (ANTIVERT) 25 MG tablet  3 times daily PRN        12/08/21 1148    ondansetron (ZOFRAN-ODT) 4 MG disintegrating tablet  Every 8 hours PRN        12/08/21 1148              Blanchie Dessert, MD 12/08/21 1153

## 2021-12-08 NOTE — ED Triage Notes (Signed)
She c/o complete loss of hearing in her right ear over ~past 4 days. She c/o sudden vomiting this morning. She cites a left acoustic neuroma removal some 30 years ago. She is in no distress. She an ambulate with one assist.

## 2021-12-12 ENCOUNTER — Encounter (HOSPITAL_COMMUNITY): Payer: Self-pay | Admitting: Certified Registered"

## 2021-12-12 ENCOUNTER — Encounter (HOSPITAL_COMMUNITY): Admission: RE | Payer: Self-pay | Source: Home / Self Care

## 2021-12-12 ENCOUNTER — Ambulatory Visit (HOSPITAL_COMMUNITY): Payer: Medicare Other

## 2021-12-12 ENCOUNTER — Ambulatory Visit (HOSPITAL_COMMUNITY): Admission: RE | Admit: 2021-12-12 | Payer: Medicare Other | Source: Home / Self Care | Admitting: Cardiology

## 2021-12-12 SURGERY — ECHOCARDIOGRAM, TRANSESOPHAGEAL
Anesthesia: Monitor Anesthesia Care

## 2021-12-13 ENCOUNTER — Telehealth (INDEPENDENT_AMBULATORY_CARE_PROVIDER_SITE_OTHER): Payer: Medicare Other | Admitting: Cardiology

## 2021-12-13 ENCOUNTER — Encounter: Payer: Self-pay | Admitting: Cardiology

## 2021-12-13 VITALS — Ht 68.0 in | Wt 155.0 lb

## 2021-12-13 DIAGNOSIS — R040 Epistaxis: Secondary | ICD-10-CM

## 2021-12-13 DIAGNOSIS — I351 Nonrheumatic aortic (valve) insufficiency: Secondary | ICD-10-CM

## 2021-12-13 DIAGNOSIS — R270 Ataxia, unspecified: Secondary | ICD-10-CM

## 2021-12-13 DIAGNOSIS — I10 Essential (primary) hypertension: Secondary | ICD-10-CM | POA: Diagnosis not present

## 2021-12-13 DIAGNOSIS — R42 Dizziness and giddiness: Secondary | ICD-10-CM

## 2021-12-13 DIAGNOSIS — R0609 Other forms of dyspnea: Secondary | ICD-10-CM

## 2021-12-13 DIAGNOSIS — I480 Paroxysmal atrial fibrillation: Secondary | ICD-10-CM

## 2021-12-13 DIAGNOSIS — I059 Rheumatic mitral valve disease, unspecified: Secondary | ICD-10-CM | POA: Diagnosis not present

## 2021-12-13 DIAGNOSIS — R600 Localized edema: Secondary | ICD-10-CM | POA: Diagnosis not present

## 2021-12-13 DIAGNOSIS — R1310 Dysphagia, unspecified: Secondary | ICD-10-CM

## 2021-12-13 NOTE — Progress Notes (Signed)
Cardiology Office Note:    Date:  12/13/2021   ID:  KASSADI GELLINGS, DOB [birth year], MRN [id number]  PCP:  Melida Quitter, MD  Cardiologist:  Armanda Magic, MD    Referring MD: Melida Quitter, MD   Chief Complaint  Patient presents with   Follow-up    LE edema, HTN, MVP, AS/AI, PAF/DOE    History of Present Illness:    Latoya Parrish is a 81 y.o. female with a hx of NSTEMI secondary to coronary artery vasospasm with normal coronary arteries at cath, HTN, PAF on chronic anticoagulation with apixaban and dyslipidemia.  She has a history of mild aortic insufficiency and recent 2D echocardiogram done 10/23/2021 demonstrated a calcified ridge and left ventricular outflow tract just prior to the aortic valve with trivial flow and a peak velocity of 3.8 m/s and peak gradient 57 mmHg during Valsalva.  Her EF was 65 to 70%.  There was moderate thickening of the heart muscle as well.  There was mild MR and moderate AR.  Mean aortic valve gradient was 8 mmHg with an aortic valve V-max of 1.93 m/s.  She was set up to get a TEE done but called yesterday and canceled the procedure due to a recent episode of severe vertigo requiring ER visit.   She is here today for followup.   Past Medical History:  Diagnosis Date   Anemia    Aortic insufficiency    Mild by echo 09/2020   Aortic stenosis    mild by echo 09/2019 but not present on echo 09/2020   Arthritis    Chicken pox    Heart attack (HCC) 2006   nontransmural MI seconday to vasispasm w normal coronary arteries   High cholesterol    Ischemic dilated cardiomyopathy (HCC)    resolved   Mitral valve prolapse    with mild MR by echo 10/2015   White coat hypertension     Past Surgical History:  Procedure Laterality Date   acoustic neuroma   1991   ANKLE SURGERY  2009   CHOLECYSTECTOMY  1990   facial plastic surgery  1996   TONSILLECTOMY AND ADENOIDECTOMY  1946    Current Medications: Current Meds  Medication Sig   apixaban (ELIQUIS) 5  MG TABS tablet Take 1 tablet (5 mg total) by mouth 2 (two) times daily.   Calcium Carbonate-Vitamin D (CALCIUM-VITAMIN D) 500-200 MG-UNIT per tablet Take 1 tablet by mouth daily.    carvedilol (COREG) 6.25 MG tablet Take 1 tablet (6.25 mg total) by mouth 2 (two) times daily.   diltiazem (CARDIZEM) 30 MG tablet Take 1 tablet every 4 hours AS NEEDED for AFIB heart rate >100   diphenhydrAMINE (BENADRYL) 25 MG tablet Take 25 mg by mouth at bedtime as needed.   furosemide (LASIX) 20 MG tablet Take 1 tablet (20 mg total) by mouth as needed.   losartan (COZAAR) 50 MG tablet Take 1 tablet (50 mg total) by mouth daily.   meclizine (ANTIVERT) 25 MG tablet Take 1 tablet (25 mg total) by mouth 3 (three) times daily as needed for dizziness.   ondansetron (ZOFRAN-ODT) 4 MG disintegrating tablet Take 1 tablet (4 mg total) by mouth every 8 (eight) hours as needed for nausea or vomiting.   vitamin B-12 (CYANOCOBALAMIN) 100 MCG tablet Take 100 mcg by mouth daily.   White Petrolatum-Mineral Oil (LUBRICANT EYE NIGHTTIME) OINT Apply 1 application to eye at bedtime as needed (dry eyes).     Allergies:  Fosamax [alendronate sodium], Statins, and Ace inhibitors   Social History   Socioeconomic History   Marital status: Widowed    Spouse name: Not on file   Number of children: Not on file   Years of education: Not on file   Highest education level: Not on file  Occupational History   Occupation: teacher  Tobacco Use   Smoking status: Never   Smokeless tobacco: Never  Substance and Sexual Activity   Alcohol use: Yes    Alcohol/week: 7.0 standard drinks    Types: 7 Glasses of wine per week    Comment: 1 daily   Drug use: No   Sexual activity: Not on file  Other Topics Concern   Not on file  Social History Narrative   Widowed in 2012    Working part time   Social Determinants of Corporate investment banker Strain: Not on file  Food Insecurity: Not on file  Transportation Needs: Not on file   Physical Activity: Not on file  Stress: Not on file  Social Connections: Not on file     Family History: The patient's family history includes COPD in her father; Hypertension in her father and sister.  ROS:   Please see the history of present illness.    ROS  All other systems reviewed and negative.   EKGs/Labs/Other Studies Reviewed:    The following studies were reviewed today: none  EKG:  EKG is not ordered today   Recent Labs: 05/07/2021: BUN 9; Creatinine, Ser 1.05; Potassium 4.6; Sodium 139 11/20/2021: Hemoglobin 10.6; Platelets 228   Recent Lipid Panel    Component Value Date/Time   CHOL 231 (H) 01/25/2019 1056   TRIG 259.0 (H) 01/25/2019 1056   HDL 48.50 01/25/2019 1056   CHOLHDL 5 01/25/2019 1056   VLDL 51.8 (H) 01/25/2019 1056   LDLCALC 121 (H) 01/04/2016 0907   LDLDIRECT 145.0 01/25/2019 1056    Physical Exam:    VS:  Ht 5\' 8"  (1.727 m)    Wt 155 lb (70.3 kg)    BMI 23.57 kg/m     Wt Readings from Last 3 Encounters:  12/13/21 155 lb (70.3 kg)  11/16/21 156 lb (70.8 kg)  09/26/21 157 lb (71.2 kg)    Well nourished, well developed female in no acute distress. Well appearing, alert and conversant, regular work of breathing,  good skin color  Eyes- anicteric mouth- oral mucosa is pink  neuro- grossly intact skin- no apparent rash or lesions or cyanosis  ASSESSMENT:    1. Bilateral leg edema   2. Essential hypertension   3. Mitral valve disorder   4. Nonrheumatic aortic valve insufficiency   5. Paroxysmal atrial fibrillation (HCC)   6. DOE (dyspnea on exertion)    PLAN:    In order of problems listed above:  1.  LE edema -Her lower extremity edema is very well controlled on as needed diuretics -Continue prescription drug management with Lasix 20 mg as needed for lower extremity edema -continue to follow low Na diet  2.  HTN -BP is adequately controlled at home at 136/11mmHg -Continue prescription drug management with carvedilol 6.25 mg  twice daily and losartan 50 mg daily with as needed refills  -I have personally reviewed and interpreted outside labs performed by PCP which showed serum creatinine 1.05, potassium 4.6 on 05/07/2021  3.  MVP -no prolapse on last echo 11/2017  4.  Aortic stenosis and AI -mild AS and moderate AI on echo 09/2019 -repeat echo  10/14/2020 showed no aortic stenosis and mild AI -2D echo 10/23/2021 showed a possible calcified ridge in the LVOT with turbulent flow and peak velocity 3.8 m/s and peak Gradient of with Valsalva and normal LVF, moderate AI and moderate PI -We need to get a TEE set up to evaluate the LVOT further but she has been having problems with swallowing for a year and getting things caught in her throat.  I will get a GI evaluation prior to setting up TEE  5.  PAF -She is maintaining normal sinus rhythm on exam with no complaints of palpitations -continue prescription drug management with Eliquis 5 mg twice daily, carvedilol 6.25 mg twice daily and as needed Cardizem with as needed refills  -she says that she gets nosebleeds about once monthly lasting a few hours at a time -I will refer to ENT for epistaxis>>this was done at last OV but never followed through>>she has seen Dr. Jearld Fenton in the past.  They can address her recent hearing loss and vertigo at that time as well -I have personally reviewed and interpreted outside labs performed by ER MD which showed hemoglobin 10.6 on 11/20/2021  6.  DOE -this is new and has started to occur with exertion including house work>>she thinks it is related to dehydration -2D echo  -lexiscan myoview was ordered but has not been done as she had to cancel due to severe vertigo recently>>she will call my nurse when her vertigo has resolved to get this scheduled  7.  Left carotid bruit -this is very faint -carotid dopplers showed trace plaque   Medication Adjustments/Labs and Tests Ordered: Current medicines are reviewed at length with the  patient today.  Concerns regarding medicines are outlined above.  No orders of the defined types were placed in this encounter.   No orders of the defined types were placed in this encounter.    Signed, Armanda Magic, MD  12/13/2021 3:52 PM    [facility]

## 2021-12-13 NOTE — Patient Instructions (Signed)
Medication Instructions:  Your physician recommends that you continue on your current medications as directed. Please refer to the Current Medication list given to you today.  *If you need a refill on your cardiac medications before your next appointment, please call your pharmacy*   Testing/Procedures: Please call us once your vertigo has resolved to schedule your lexiscan myoview   Follow-Up: At [facility], you and your health needs are our priority.  As part of our continuing mission to provide you with exceptional heart care, we have created designated Provider Care Teams.  These Care Teams include your primary Cardiologist (physician) and Advanced Practice Providers (APPs -  Physician Assistants and Nurse Practitioners) who all work together to provide you with the care you need, when you need it.  Follow up with Dr. Mayford Knife after TEE:1}    Other Instructions You have been referred to a Gastroenterologist and an Ear, Nose, and Throat Specialist.

## 2021-12-13 NOTE — Addendum Note (Signed)
Addended by: Theresia Majors on: 12/13/2021 04:12 PM   Modules accepted: Orders

## 2022-01-11 ENCOUNTER — Other Ambulatory Visit: Payer: Self-pay | Admitting: Internal Medicine

## 2022-01-11 DIAGNOSIS — R42 Dizziness and giddiness: Secondary | ICD-10-CM

## 2022-01-15 ENCOUNTER — Emergency Department (HOSPITAL_COMMUNITY): Payer: Medicare Other

## 2022-01-15 ENCOUNTER — Encounter (HOSPITAL_COMMUNITY): Payer: Self-pay | Admitting: Internal Medicine

## 2022-01-15 ENCOUNTER — Other Ambulatory Visit: Payer: Self-pay

## 2022-01-15 ENCOUNTER — Observation Stay (HOSPITAL_COMMUNITY)
Admission: EM | Admit: 2022-01-15 | Discharge: 2022-01-19 | Disposition: A | Discharge status: Skilled Nursing Facility | Payer: Medicare Other | Attending: Internal Medicine | Admitting: Internal Medicine

## 2022-01-15 DIAGNOSIS — Z86018 Personal history of other benign neoplasm: Secondary | ICD-10-CM | POA: Diagnosis not present

## 2022-01-15 DIAGNOSIS — Z20822 Contact with and (suspected) exposure to covid-19: Secondary | ICD-10-CM | POA: Insufficient documentation

## 2022-01-15 DIAGNOSIS — W19XXXA Unspecified fall, initial encounter: Secondary | ICD-10-CM

## 2022-01-15 DIAGNOSIS — R262 Difficulty in walking, not elsewhere classified: Secondary | ICD-10-CM | POA: Insufficient documentation

## 2022-01-15 DIAGNOSIS — I10 Essential (primary) hypertension: Secondary | ICD-10-CM | POA: Insufficient documentation

## 2022-01-15 DIAGNOSIS — R0602 Shortness of breath: Secondary | ICD-10-CM

## 2022-01-15 DIAGNOSIS — R339 Retention of urine, unspecified: Secondary | ICD-10-CM | POA: Diagnosis present

## 2022-01-15 DIAGNOSIS — Z79899 Other long term (current) drug therapy: Secondary | ICD-10-CM | POA: Diagnosis not present

## 2022-01-15 DIAGNOSIS — S3289XA Fracture of other parts of pelvis, initial encounter for closed fracture: Principal | ICD-10-CM | POA: Insufficient documentation

## 2022-01-15 DIAGNOSIS — E7800 Pure hypercholesterolemia, unspecified: Secondary | ICD-10-CM | POA: Diagnosis not present

## 2022-01-15 DIAGNOSIS — I4891 Unspecified atrial fibrillation: Secondary | ICD-10-CM | POA: Insufficient documentation

## 2022-01-15 DIAGNOSIS — R296 Repeated falls: Secondary | ICD-10-CM

## 2022-01-15 DIAGNOSIS — Z7901 Long term (current) use of anticoagulants: Secondary | ICD-10-CM | POA: Insufficient documentation

## 2022-01-15 DIAGNOSIS — R42 Dizziness and giddiness: Secondary | ICD-10-CM | POA: Diagnosis not present

## 2022-01-15 DIAGNOSIS — R338 Other retention of urine: Secondary | ICD-10-CM | POA: Diagnosis present

## 2022-01-15 DIAGNOSIS — I059 Rheumatic mitral valve disease, unspecified: Secondary | ICD-10-CM | POA: Diagnosis present

## 2022-01-15 DIAGNOSIS — R9389 Abnormal findings on diagnostic imaging of other specified body structures: Secondary | ICD-10-CM | POA: Diagnosis present

## 2022-01-15 DIAGNOSIS — I480 Paroxysmal atrial fibrillation: Secondary | ICD-10-CM | POA: Insufficient documentation

## 2022-01-15 DIAGNOSIS — S329XXA Fracture of unspecified parts of lumbosacral spine and pelvis, initial encounter for closed fracture: Secondary | ICD-10-CM

## 2022-01-15 LAB — CBC WITH DIFFERENTIAL/PLATELET
Abs Immature Granulocytes: 0.07 10*3/uL (ref 0.00–0.07)
Basophils Absolute: 0 10*3/uL (ref 0.0–0.1)
Basophils Relative: 0 %
Eosinophils Absolute: 0.1 10*3/uL (ref 0.0–0.5)
Eosinophils Relative: 1 %
HCT: 32 % — ABNORMAL LOW (ref 36.0–46.0)
Hemoglobin: 10.3 g/dL — ABNORMAL LOW (ref 12.0–15.0)
Immature Granulocytes: 1 %
Lymphocytes Relative: 19 %
Lymphs Abs: 1.7 10*3/uL (ref 0.7–4.0)
MCH: 27.3 pg (ref 26.0–34.0)
MCHC: 32.2 g/dL (ref 30.0–36.0)
MCV: 84.9 fL (ref 80.0–100.0)
Monocytes Absolute: 0.7 10*3/uL (ref 0.1–1.0)
Monocytes Relative: 7 %
Neutro Abs: 6.7 10*3/uL (ref 1.7–7.7)
Neutrophils Relative %: 72 %
Platelets: 190 10*3/uL (ref 150–400)
RBC: 3.77 MIL/uL — ABNORMAL LOW (ref 3.87–5.11)
RDW: 14.3 % (ref 11.5–15.5)
WBC: 9.3 10*3/uL (ref 4.0–10.5)
nRBC: 0 % (ref 0.0–0.2)

## 2022-01-15 LAB — BASIC METABOLIC PANEL
Anion gap: 5 (ref 5–15)
BUN: 13 mg/dL (ref 8–23)
CO2: 24 mmol/L (ref 22–32)
Calcium: 8.4 mg/dL — ABNORMAL LOW (ref 8.9–10.3)
Chloride: 107 mmol/L (ref 98–111)
Creatinine, Ser: 0.97 mg/dL (ref 0.44–1.00)
GFR, Estimated: 59 mL/min — ABNORMAL LOW (ref >60)
Glucose, Bld: 102 mg/dL — ABNORMAL HIGH (ref 70–99)
Potassium: 3.9 mmol/L (ref 3.5–5.1)
Sodium: 136 mmol/L (ref 135–145)

## 2022-01-15 LAB — CBC
HCT: 30.8 % — ABNORMAL LOW (ref 36.0–46.0)
Hemoglobin: 9.6 g/dL — ABNORMAL LOW (ref 12.0–15.0)
MCH: 26.6 pg (ref 26.0–34.0)
MCHC: 31.2 g/dL (ref 30.0–36.0)
MCV: 85.3 fL (ref 80.0–100.0)
Platelets: 171 10*3/uL (ref 150–400)
RBC: 3.61 MIL/uL — ABNORMAL LOW (ref 3.87–5.11)
RDW: 14.1 % (ref 11.5–15.5)
WBC: 8.1 10*3/uL (ref 4.0–10.5)
nRBC: 0 % (ref 0.0–0.2)

## 2022-01-15 LAB — TYPE AND SCREEN
ABO/RH(D): O POS
Antibody Screen: NEGATIVE

## 2022-01-15 LAB — ABO/RH: ABO/RH(D): O POS

## 2022-01-15 LAB — CREATININE, SERUM
Creatinine, Ser: 0.96 mg/dL (ref 0.44–1.00)
GFR, Estimated: 59 mL/min — ABNORMAL LOW (ref >60)

## 2022-01-15 LAB — PROTIME-INR
INR: 1.1 (ref 0.8–1.2)
Prothrombin Time: 14.1 seconds (ref 11.4–15.2)

## 2022-01-15 LAB — RESP PANEL BY RT-PCR (FLU A&B, COVID) ARPGX2
Influenza A by PCR: NEGATIVE
Influenza B by PCR: NEGATIVE
SARS Coronavirus 2 by RT PCR: NEGATIVE

## 2022-01-15 LAB — VITAMIN D 25 HYDROXY (VIT D DEFICIENCY, FRACTURES): Vit D, 25-Hydroxy: 27.51 ng/mL — ABNORMAL LOW (ref 30–100)

## 2022-01-15 LAB — GLUCOSE, CAPILLARY: Glucose-Capillary: 111 mg/dL — ABNORMAL HIGH (ref 70–99)

## 2022-01-15 MED ORDER — HYDROCODONE-ACETAMINOPHEN 5-325 MG PO TABS
1.0000 | ORAL_TABLET | Freq: Four times a day (QID) | ORAL | Status: DC | PRN
  Administered 2022-01-16: 2 via ORAL
  Administered 2022-01-16: 1 via ORAL
  Administered 2022-01-16 – 2022-01-17 (×2): 2 via ORAL
  Filled 2022-01-15 (×5): qty 2

## 2022-01-15 MED ORDER — ENOXAPARIN SODIUM 40 MG/0.4ML IJ SOSY
40.0000 mg | PREFILLED_SYRINGE | Freq: Every day | INTRAMUSCULAR | Status: DC
  Administered 2022-01-15: 40 mg via SUBCUTANEOUS
  Filled 2022-01-15: qty 0.4

## 2022-01-15 MED ORDER — MORPHINE SULFATE (PF) 4 MG/ML IV SOLN
4.0000 mg | INTRAVENOUS | Status: AC | PRN
  Administered 2022-01-15 (×2): 4 mg via INTRAVENOUS
  Filled 2022-01-15 (×2): qty 1

## 2022-01-15 MED ORDER — SODIUM CHLORIDE 0.9 % IV SOLN: INTRAVENOUS | Status: DC

## 2022-01-15 MED ORDER — DOCUSATE SODIUM 100 MG PO CAPS
100.0000 mg | ORAL_CAPSULE | Freq: Two times a day (BID) | ORAL | Status: DC
  Administered 2022-01-15 – 2022-01-19 (×5): 100 mg via ORAL
  Filled 2022-01-15 (×8): qty 1

## 2022-01-15 MED ORDER — MORPHINE SULFATE (PF) 2 MG/ML IV SOLN
0.5000 mg | INTRAVENOUS | Status: DC | PRN
  Administered 2022-01-16: 0.5 mg via INTRAVENOUS
  Filled 2022-01-15: qty 1

## 2022-01-15 NOTE — ED Provider Notes (Signed)
[facility] DEPT Provider Note   CSN: [id number] Arrival date & time: 01/15/22  [id number]     History  Chief Complaint  Patient presents with   Fall   Hip Pain    Latoya Parrish is a 81 y.o. female.   Fall Pertinent negatives include no headaches.  Hip Pain Pertinent negatives include no headaches.   Patient presents to the ED for evaluation of right hip pain.  Patient states she does have a history of vertigo and has been having issues with that recently.  Her balance has been off and she ended up falling today striking her right hip.  Patient states since that time she has not been able to get up and walk because of the pain and discomfort.  Patient denies hitting her head.  She is not having a headache.  She denies any neck pain or back pain.  Other than her right hip patient states she has some mild bruising of her right elbow but is not tender at all.  Home Medications Prior to Admission medications   Medication Sig Start Date End Date Taking? Authorizing Provider  apixaban (ELIQUIS) 5 MG TABS tablet Take 1 tablet (5 mg total) by mouth 2 (two) times daily. 09/26/21   Sueanne Margarita, MD  Calcium Carbonate-Vitamin D (CALCIUM-VITAMIN D) 500-200 MG-UNIT per tablet Take 1 tablet by mouth daily.     [provider]  carvedilol (COREG) 6.25 MG tablet Take 1 tablet (6.25 mg total) by mouth 2 (two) times daily. 09/26/21   Sueanne Margarita, MD  diltiazem (CARDIZEM) 30 MG tablet Take 1 tablet every 4 hours AS NEEDED for AFIB heart rate >100 09/26/21   Turner, Eber Hong, MD  diphenhydrAMINE (BENADRYL) 25 MG tablet Take 25 mg by mouth at bedtime as needed.    [provider]  furosemide (LASIX) 20 MG tablet Take 1 tablet (20 mg total) by mouth as needed. 09/26/21 05/24/22  Sueanne Margarita, MD  losartan (COZAAR) 50 MG tablet Take 1 tablet (50 mg total) by mouth daily. 09/26/21   Sueanne Margarita, MD  meclizine (ANTIVERT) 25 MG tablet Take 1 tablet (25 mg  total) by mouth 3 (three) times daily as needed for dizziness. 12/08/21   Blanchie Dessert, MD  ondansetron (ZOFRAN-ODT) 4 MG disintegrating tablet Take 1 tablet (4 mg total) by mouth every 8 (eight) hours as needed for nausea or vomiting. 12/08/21   Blanchie Dessert, MD  vitamin B-12 (CYANOCOBALAMIN) 100 MCG tablet Take 100 mcg by mouth daily.    [provider]  Jay Schlichter Oil (LUBRICANT EYE NIGHTTIME) OINT Apply 1 application to eye at bedtime as needed (dry eyes).    [provider]      Allergies    Fosamax [alendronate sodium], Statins, and Ace inhibitors    Review of Systems   Review of Systems  Constitutional:  Negative for fever.  Neurological:  Negative for light-headedness and headaches.   Physical Exam Updated Vital Signs BP (!) 131/57 (BP Location: Right Arm)    Pulse 85    Temp 97.7 F (36.5 C) (Oral)    Resp 18    Ht 1.727 m (5\' 8" )    Wt 56.7 kg    SpO2 96%    BMI 19.01 kg/m  Physical Exam Vitals and nursing note reviewed.  Constitutional:      Appearance: She is well-developed. She is not ill-appearing or diaphoretic.  HENT:     Head: Normocephalic and atraumatic.  Right Ear: External ear normal.     Left Ear: External ear normal.  Eyes:     General: No scleral icterus.       Right eye: No discharge.        Left eye: No discharge.     Conjunctiva/sclera: Conjunctivae normal.  Neck:     Trachea: No tracheal deviation.  Cardiovascular:     Rate and Rhythm: Normal rate and regular rhythm.  Pulmonary:     Effort: Pulmonary effort is normal. No respiratory distress.     Breath sounds: Normal breath sounds. No stridor. No wheezing or rales.  Abdominal:     General: Bowel sounds are normal. There is no distension.     Palpations: Abdomen is soft.     Tenderness: There is no abdominal tenderness. There is no guarding or rebound.  Musculoskeletal:        General: No deformity.     Cervical back: Normal and neck supple.      Thoracic back: Normal.     Lumbar back: Normal.     Right hip: Tenderness and bony tenderness present. Decreased range of motion. Normal strength.  Skin:    General: Skin is warm and dry.     Findings: No rash.  Neurological:     General: No focal deficit present.     Mental Status: She is alert.     Cranial Nerves: No cranial nerve deficit (no facial droop, extraocular movements intact, no slurred speech).     Sensory: No sensory deficit.     Motor: No abnormal muscle tone or seizure activity.     Coordination: Coordination normal.  Psychiatric:        Mood and Affect: Mood normal.    ED Results / Procedures / Treatments   Labs (all labs ordered are listed, but only abnormal results are displayed) Labs Reviewed  BASIC METABOLIC PANEL - Abnormal; Notable for the following components:      Result Value   Glucose, Bld 102 (*)    Calcium 8.4 (*)    GFR, Estimated 59 (*)    All other components within normal limits  CBC WITH DIFFERENTIAL/PLATELET - Abnormal; Notable for the following components:   RBC 3.77 (*)    Hemoglobin 10.3 (*)    HCT 32.0 (*)    All other components within normal limits  RESP PANEL BY RT-PCR (FLU A&B, COVID) [id number]  PROTIME-INR  TYPE AND SCREEN  ABO/RH    EKG EKG Interpretation  Date/Time:  Tuesday January 15 2022 09:07:24 EST Ventricular Rate:  83 PR Interval:  178 QRS Duration: 70 QT Interval:  382 QTC Calculation: 448 R Axis:   53 Text Interpretation: Normal sinus rhythm Nonspecific T wave abnormality , new since last tracing Abnormal ECG When compared with ECG of 05-Mar-2021 09:50, PREVIOUS ECG IS PRESENT Confirmed by Dorie Rank [id number]) on 01/15/2022 9:15:42 AM  Radiology DG Chest 1 View  Result Date: 01/15/2022 CLINICAL DATA:  An 81 year old female presents following fall. EXAM: CHEST  1 VIEW COMPARISON:  July 15, 2019 FINDINGS: Image rotated to the LEFT. Accounting for this cardiomediastinal contours and hilar structures are stable.  Lungs are clear. No visible pneumothorax. On limited assessment there is no visible acute bony abnormality. IMPRESSION: No acute cardiopulmonary disease. Electronically Signed   By: Zetta Bills M.D.   On: 01/15/2022 10:02   DG Hip Unilat With Pelvis 2-3 Views Right  Result Date: 01/15/2022 CLINICAL DATA:  W a female age 89 presents following  fall with hip injury, fall to RIGHT hip with pain on movement. EXAM: DG HIP (WITH OR WITHOUT PELVIS) 2-3V RIGHT COMPARISON:  Comparison made with May 27, 2012. FINDINGS: Marked interval worsening of degenerative change about the RIGHT hip since previous imaging, bone on bone contact is noted. Osteopenia limits assessment. No visible fracture. Lateral projection limited by cross-table projection. No signs of fracture of the bony pelvis. IMPRESSION: Marked interval worsening of degenerative change about the RIGHT hip. No visible fracture. Limited assessment due to osteopenia and cross-table projection. If there is high clinical suspicion for fracture CT may be helpful for further evaluation. Electronically Signed   By: Zetta Bills M.D.   On: 01/15/2022 10:00    Procedures Procedures    Medications Ordered in ED Medications  0.9 %  sodium chloride infusion ( Intravenous New Bag/Given 01/15/22 0924)  morphine (PF) 4 MG/ML injection 4 mg (4 mg Intravenous Given 01/15/22 1130)    ED Course/ Medical Decision Making/ A&P Clinical Course as of 01/15/22 1331  Tue Jan 15, 2022  1019 CBC WITH DIFFERENTIAL(!) CBC shows stable anemia [JK]  123456 Basic metabolic panel(!) Metabolic panel normal [JK]  1020 DG Chest 1 View Chest x-ray images and radiology report reviewed.  No acute findings [JK]  1020 DG Hip Unilat With Pelvis 2-3 Views Right Hip x-ray showed severe arthritis findings.  No definite acute fracture.  We will proceed with CT imaging due to the limitations of the plain films [JK]  1254 CT scan of the hip reviewed: Minimally displaced right inferior pubic  ramus fracture.  Nondisplaced right sacral ala fracture in zone 1 extending inferiorly in close proximity to the anterior aspect of the sacroiliac joint.  Nondisplaced fracture at the superior pubic root near the acetabulum   [JK]  1309 Discussed with orthopedics.  Weight bearing as tolerated.  No surgery.  Will consult [JK]    Clinical Course User Index [JK] Dorie Rank, MD                           Medical Decision Making Amount and/or Complexity of Data Reviewed Labs: ordered. Decision-making details documented in ED Course. Radiology: ordered. Decision-making details documented in ED Course.  Risk Prescription drug management. Decision regarding hospitalization.  Vertigo Patient has history of vertigo.  Complains of recurrent symptoms.  No focal neurologic deficits to suggest central cause.  Fall Patient is on anticoagulation but denies any headache and did not strike her head.  CT imaging of the brain deferred.  Patient did have tenderness in her right hip.  Initial plain films do not show any signs of fracture.  Patient unable to ambulate so CT scan performed and it demonstrates pubic rami and sacral ala fracture.  No evidence of hip fracture.  Case was discussed with orthopedics.  I will consult on patient but this injury was managed nonoperatively.  With the patient's inability to ambulate I spoke with Dr. Marylyn Ishihara hospitalist service regarding admission for further treatment and evaluation.          Final Clinical Impression(s) / ED Diagnoses Final diagnoses:  Closed nondisplaced fracture of pelvis, unspecified part of pelvis, initial encounter [facility])      Dorie Rank, MD 01/15/22 1331

## 2022-01-15 NOTE — H&P (Signed)
History and Physical    Patient: Latoya Parrish DOB: 1941/02/17 DOA: 01/15/2022 DOS: the patient was seen and examined on 01/15/2022 PCP: Melida Quitter, MD  Patient coming from: Home  Chief Complaint:  Chief Complaint  Patient presents with   Fall   Hip Pain    HPI: Latoya Parrish is a 81 y.o. female with medical history significant of PAF, HTN, ischemic cardiomyopathy, vertigo, MVP. Presenting with right hip pain after fall. She reports that she woke up as normal this morning. She was moving around her bed when she lost her balance and fell towards a chair. When she grabbed on the back of the chair, it spun around and she landed on her right hip. She didn't hit her head. She didn't pass out. She didn't have any chest pain or palpitations prior to her fall. She did feel dizzy prior to the fall. She was unable to get up. She called for EMS. She denies any other aggravating or alleviating factors.    Review of Systems: As mentioned in the history of present illness. All other systems reviewed and are negative. Past Medical History:  Diagnosis Date   Anemia    Aortic insufficiency    Mild by echo 09/2020   Aortic stenosis    mild by echo 09/2019 but not present on echo 09/2020   Arthritis    Chicken pox    Heart attack (HCC) 2006   nontransmural MI seconday to vasispasm w normal coronary arteries   High cholesterol    Ischemic dilated cardiomyopathy (HCC)    resolved   Mitral valve prolapse    with mild MR by echo 10/2015   White coat hypertension    Past Surgical History:  Procedure Laterality Date   acoustic neuroma   1991   ANKLE SURGERY  2009   CHOLECYSTECTOMY  1990   facial plastic surgery  1996   TONSILLECTOMY AND ADENOIDECTOMY  1946   Social History:  reports that she has never smoked. She has never used smokeless tobacco. She reports current alcohol use of about 7.0 standard drinks per week. She reports that she does not use drugs.  Allergies   Allergen Reactions   Fosamax [Alendronate Sodium]     Dental problems   Statins     Muscle pain    Ace Inhibitors Cough    Family History  Problem Relation Age of Onset   Hypertension Father    COPD Father    Hypertension Sister     Prior to Admission medications   Medication Sig Start Date End Date Taking? Authorizing Provider  apixaban (ELIQUIS) 5 MG TABS tablet Take 1 tablet (5 mg total) by mouth 2 (two) times daily. 09/26/21   Quintella Reichert, MD  Calcium Carbonate-Vitamin D (CALCIUM-VITAMIN D) 500-200 MG-UNIT per tablet Take 1 tablet by mouth daily.     [provider]  carvedilol (COREG) 6.25 MG tablet Take 1 tablet (6.25 mg total) by mouth 2 (two) times daily. 09/26/21   Quintella Reichert, MD  diltiazem (CARDIZEM) 30 MG tablet Take 1 tablet every 4 hours AS NEEDED for AFIB heart rate >100 09/26/21   Turner, Cornelious Bryant, MD  diphenhydrAMINE (BENADRYL) 25 MG tablet Take 25 mg by mouth at bedtime as needed.    [provider]  furosemide (LASIX) 20 MG tablet Take 1 tablet (20 mg total) by mouth as needed. 09/26/21 05/24/22  Quintella Reichert, MD  losartan (COZAAR) 50 MG tablet Take 1 tablet (  50 mg total) by mouth daily. 09/26/21   Quintella Reichert, MD  meclizine (ANTIVERT) 25 MG tablet Take 1 tablet (25 mg total) by mouth 3 (three) times daily as needed for dizziness. 12/08/21   Gwyneth Sprout, MD  ondansetron (ZOFRAN-ODT) 4 MG disintegrating tablet Take 1 tablet (4 mg total) by mouth every 8 (eight) hours as needed for nausea or vomiting. 12/08/21   Gwyneth Sprout, MD  vitamin B-12 (CYANOCOBALAMIN) 100 MCG tablet Take 100 mcg by mouth daily.    [provider]  Rayburn Ma Oil (LUBRICANT EYE NIGHTTIME) OINT Apply 1 application to eye at bedtime as needed (dry eyes).    [provider]    Physical Exam: Vitals:   01/15/22 0840 01/15/22 0842 01/15/22 1016 01/15/22 1238  BP: (!) 172/72  (!) 152/66 (!) 131/57  Pulse: 82  80 85  Resp: 16  18  18   Temp: 97.7 F (36.5 C)     TempSrc: Oral     SpO2: 96%  96% 96%  Weight:  56.7 kg    Height:  5\' 8"  (1.727 m)     General: 81 y.o. female resting in bed in NAD Eyes: PERRL, normal sclera ENMT: Nares patent w/o discharge, orophaynx clear, dentition normal, ears w/o discharge/lesions/ulcers Neck: Supple, trachea midline Cardiovascular: RRR, +S1, S2, no m/g/r, equal pulses throughout Respiratory: CTABL, no w/r/r, normal WOB GI: BS+, NDNT, no masses noted, no organomegaly noted MSK: No e/c/c; limited right hip motion d/t pain Neuro: A&O x 3, no focal deficits Psyc: Appropriate interaction and affect, calm/cooperative  Data Reviewed:  Hgb  10.3 R hip XR: Marked interval worsening of degenerative change about the RIGHT hip. No visible fracture. Limited assessment due to osteopenia and cross-table projection. If there is high clinical suspicion for fracture CT may be helpful for further evaluation  CT right hip pending   Assessment and Plan: No notes have been filed under this hospital service. Service: Hospitalist Fall Pelvic rami fracture     - place in obs, med-surg     - awaiting ortho final recs; likely non-operative     - pain control     - PT/OT/TOC  Vertigo     - currently undergoing w/u w/ PCP; MRI scheduled for next week; continue outpt follow up  PAF Mitral valve prolapse     - continue home regimen  HTN Ischemic dilated cardiomyopathy     - continue home regimen  HLD     - continue home regimen  Advance Care Planning:   Code Status: DNR  Consults: EDP spoke with ortho  Family Communication: None at bedside  Severity of Illness: The appropriate patient status for this patient is OBSERVATION. Observation status is judged to be reasonable and necessary in order to provide the required intensity of service to ensure the patient's safety. The patient's presenting symptoms, physical exam findings, and initial radiographic and laboratory data in the context  of their medical condition is felt to place them at decreased risk for further clinical deterioration. Furthermore, it is anticipated that the patient will be medically stable for discharge from the hospital within 2 midnights of admission.   Author: , DO 01/15/2022 1:14 PM  For on call review [email or web address].

## 2022-01-15 NOTE — ED Notes (Addendum)
Pt NAD in bed, a/ox4. Pt states she was in her bedroom and tripped over a spinning stool, landing on her right hip/leg. Denies LOC, head/neck pain. +thinners. Pt c/o 5/10 right hip, low back and lateral pelvic pain. -instability noted. Delayed cap refill to right foot.

## 2022-01-15 NOTE — Progress Notes (Signed)
Orthopedics consulted for patient's non displaced pelvic fracture. This will be managed non-operatively. WBAT. Recommend PT/OT.    Since patient has been admitted to the hospitalist team, formal consultation to follow in the AM.   Alfonse Alpers, PA-C 01/15/2022

## 2022-01-15 NOTE — Plan of Care (Signed)
New admission, 1517. Pelvic Fracture.  Pain management per orders.  IVF. NPO.  Problem: Education: Goal: Knowledge of General Education information will improve Description: Including pain rating scale, medication(s)/side effects and non-pharmacologic comfort measures Outcome: Progressing   Problem: Health Behavior/Discharge Planning: Goal: Ability to manage health-related needs will improve Outcome: Progressing   Problem: Clinical Measurements: Goal: Ability to maintain clinical measurements within normal limits will improve Outcome: Progressing Goal: Will remain free from infection Outcome: Progressing Goal: Diagnostic test results will improve Outcome: Progressing Goal: Respiratory complications will improve Outcome: Progressing Goal: Cardiovascular complication will be avoided Outcome: Progressing   Problem: Activity: Goal: Risk for activity intolerance will decrease Outcome: Progressing   Problem: Nutrition: Goal: Adequate nutrition will be maintained Outcome: Progressing   Problem: Coping: Goal: Level of anxiety will decrease Outcome: Progressing   Problem: Elimination: Goal: Will not experience complications related to bowel motility Outcome: Progressing Goal: Will not experience complications related to urinary retention Outcome: Progressing   Problem: Pain Managment: Goal: General experience of comfort will improve Outcome: Progressing   Problem: Safety: Goal: Ability to remain free from injury will improve Outcome: Progressing   Problem: Skin Integrity: Goal: Risk for impaired skin integrity will decrease Outcome: Progressing

## 2022-01-15 NOTE — ED Triage Notes (Signed)
Pt BIBA from home for fall d/t vertigo, has hx. Fell on right hip. No shortening or rotation but c/o pain if moved. Denies head trauma or LOC, no other complaints. Taking eliquis for afib. 18ga LAC  176/80 HR 80s 96% RA

## 2022-01-15 NOTE — Progress Notes (Signed)
° ° °  OVERNIGHT PROGRESS REPORT  Notified by RN for diet inquiry.  Originally NPO for Orthopedic consult re: fracture.  Orthopedic note indicates : "This will be managed non-operatively. WBAT. Recommend PT/OT."   In light of this, diet is made advance as tolerated NPO-> Heart Healthy    Chinita Greenland MSNA MSN ACNPC-AG Acute Care Nurse Practitioner [facility]

## 2022-01-16 ENCOUNTER — Observation Stay (HOSPITAL_COMMUNITY): Payer: Medicare Other

## 2022-01-16 DIAGNOSIS — W19XXXA Unspecified fall, initial encounter: Secondary | ICD-10-CM | POA: Diagnosis not present

## 2022-01-16 DIAGNOSIS — R296 Repeated falls: Secondary | ICD-10-CM | POA: Diagnosis not present

## 2022-01-16 LAB — CBC
HCT: 30.5 % — ABNORMAL LOW (ref 36.0–46.0)
Hemoglobin: 9.6 g/dL — ABNORMAL LOW (ref 12.0–15.0)
MCH: 27 pg (ref 26.0–34.0)
MCHC: 31.5 g/dL (ref 30.0–36.0)
MCV: 85.9 fL (ref 80.0–100.0)
Platelets: 169 10*3/uL (ref 150–400)
RBC: 3.55 MIL/uL — ABNORMAL LOW (ref 3.87–5.11)
RDW: 14.4 % (ref 11.5–15.5)
WBC: 7.2 10*3/uL (ref 4.0–10.5)
nRBC: 0 % (ref 0.0–0.2)

## 2022-01-16 LAB — BASIC METABOLIC PANEL
Anion gap: 4 — ABNORMAL LOW (ref 5–15)
BUN: 14 mg/dL (ref 8–23)
CO2: 23 mmol/L (ref 22–32)
Calcium: 7.9 mg/dL — ABNORMAL LOW (ref 8.9–10.3)
Chloride: 108 mmol/L (ref 98–111)
Creatinine, Ser: 0.79 mg/dL (ref 0.44–1.00)
GFR, Estimated: >60 mL/min (ref >60)
Glucose, Bld: 103 mg/dL — ABNORMAL HIGH (ref 70–99)
Potassium: 4.2 mmol/L (ref 3.5–5.1)
Sodium: 135 mmol/L (ref 135–145)

## 2022-01-16 IMAGING — MR MR HEAD WO/W CM
13 series · 48 of 48 positions shown · IV contrast (gadavist)
Comparison: None.

CLINICAL DATA: Neuro deficit, stroke suspected. Dizziness. History
of acoustic neuroma removal in [KL]

EXAM:
MRI HEAD WITHOUT AND WITH CONTRAST
TECHNIQUE: Multiplanar, multiecho pulse sequences of the brain and surrounding
structures were obtained without and with intravenous contrast.
CONTRAST:  8mL GADAVIST GADOBUTROL 1 MMOL/ML IV SOLN

[Series 5: DWI · axial · 3.0mm · 1.36mm/px · z∈[-33,+108]mm · 6 of 104 slices shown (1 of 2)]
[im 1/104]
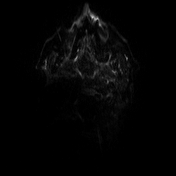
[im 21/104]
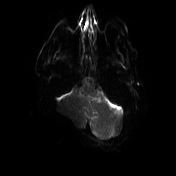
[im 42/104]
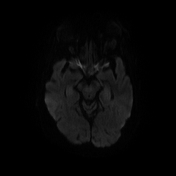
[im 62/104]
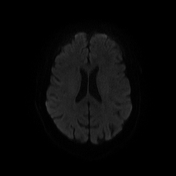
[im 83/104]
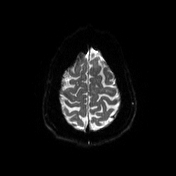
[im 104/104]
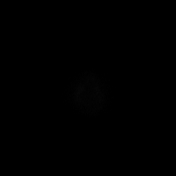

[Series 6: DWI · axial · 3.0mm · 1.36mm/px · z∈[-33,+108]mm · 3 of 52 slices shown (2 of 2)]
[im 1/52]
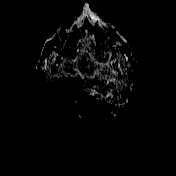
[im 26/52]
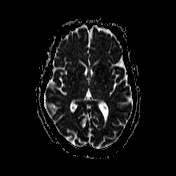
[im 52/52]
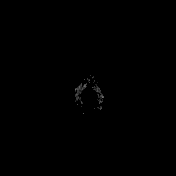

[Series 7: T1 · sagittal · 5.0mm · 0.75mm/px · 1 of 24 slices shown (1 of 2)]
[im 1/24]
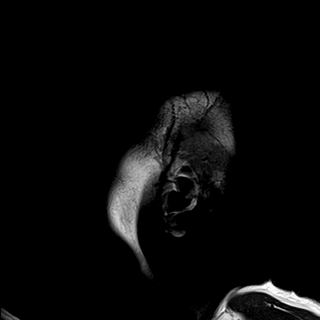

[Series 8: T2 · axial · 5.0mm · 0.62mm/px · z∈[-39,+111]mm · 2 of 26 slices shown]
[im 1/26]
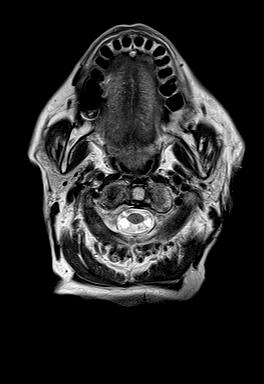
[im 26/26]
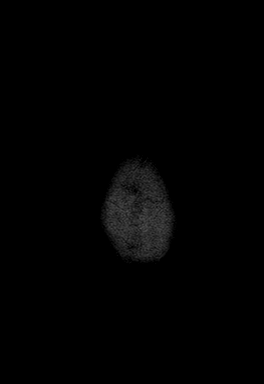

[Series 9: swi_images · axial · 3.0mm · 0.75mm/px · z∈[-41,+112]mm · 3 of 56 slices shown]
[im 1/56]
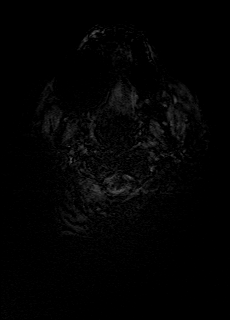
[im 28/56]
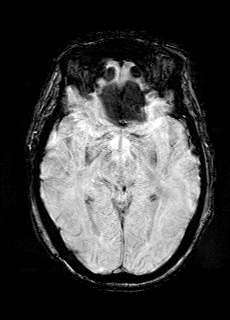
[im 56/56]
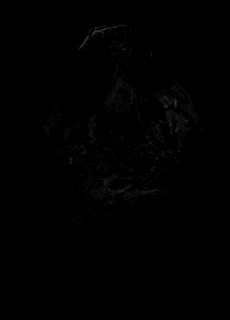

[Series 11: FLAIR · axial · 3.0mm · 0.75mm/px · z∈[-35,+106]mm · 3 of 52 slices shown]
[im 1/52]
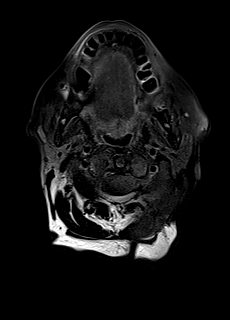
[im 26/52]
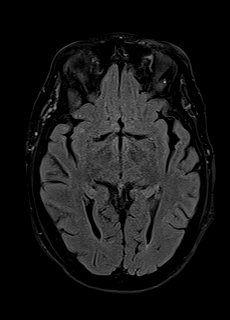
[im 52/52]
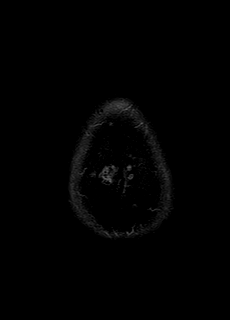

[Series 12: T1 · axial · 1.0mm · 0.94mm/px · z∈[-33,+115]mm · 10 of 160 slices shown (2 of 2)]
[im 1/160]
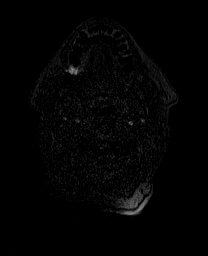
[im 18/160]
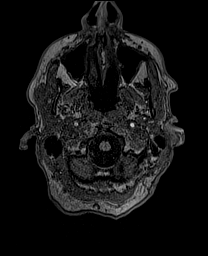
[im 36/160]
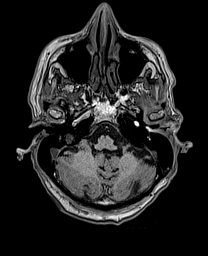
[im 54/160]
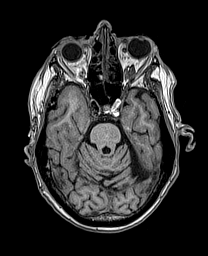
[im 71/160]
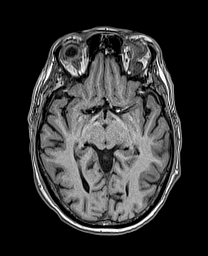
[im 89/160]
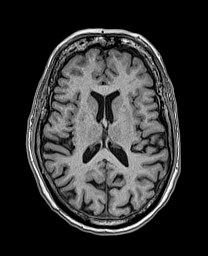
[im 107/160]
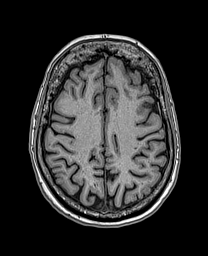
[im 124/160]
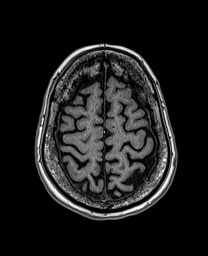
[im 142/160]
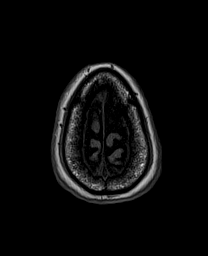
[im 160/160]
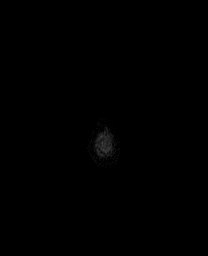

[Series 13: cor dwi_tracew · coronal · 5.0mm · 1.53mm/px · 3 of 52 slices shown]
[im 1/52]
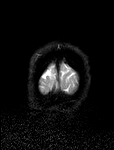
[im 26/52]
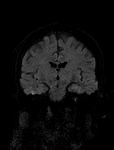
[im 52/52]
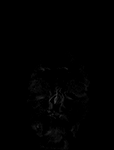

[Series 14: cor dwi_adc · coronal · 5.0mm · 1.53mm/px · 2 of 26 slices shown]
[im 1/26]
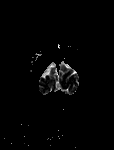
[im 26/26]
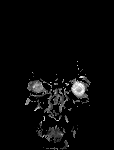

[Series 15: T2 post-contrast · coronal · 5.0mm · 0.57mm/px · 2 of 29 slices shown]
[im 1/29]
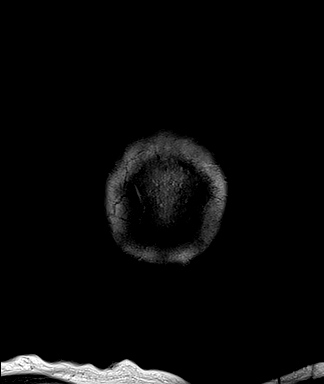
[im 29/29]
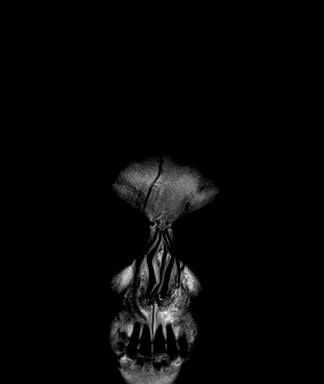

[Series 16: T1 post-contrast · axial · 1.0mm · 0.94mm/px · z∈[-33,+115]mm · 10 of 160 slices shown (1 of 3)]
[im 1/160]
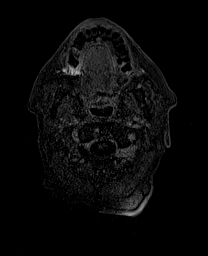
[im 18/160]
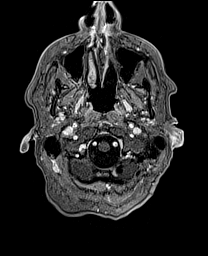
[im 36/160]
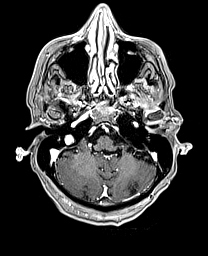
[im 54/160]
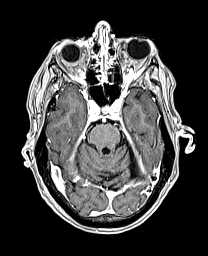
[im 71/160]
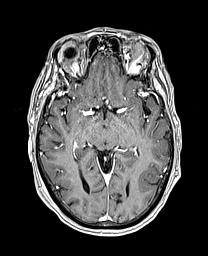
[im 89/160]
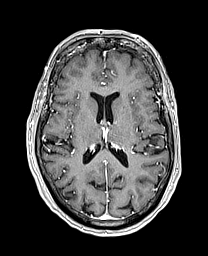
[im 107/160]
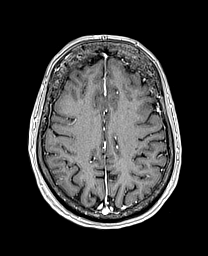
[im 124/160]
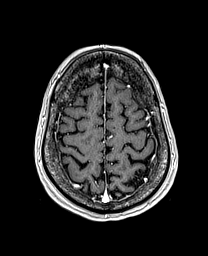
[im 142/160]
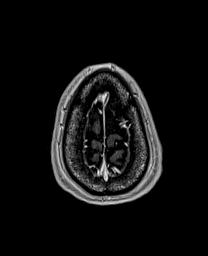
[im 160/160]
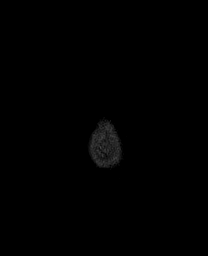

[Series 17: T1 post-contrast · coronal · 5.0mm · 0.43mm/px · 2 of 29 slices shown (2 of 3)]
[im 1/29]
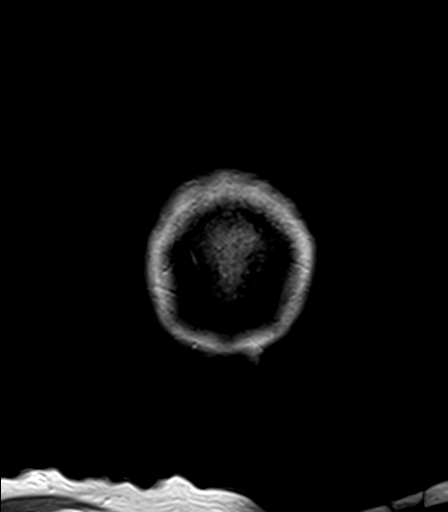
[im 29/29]
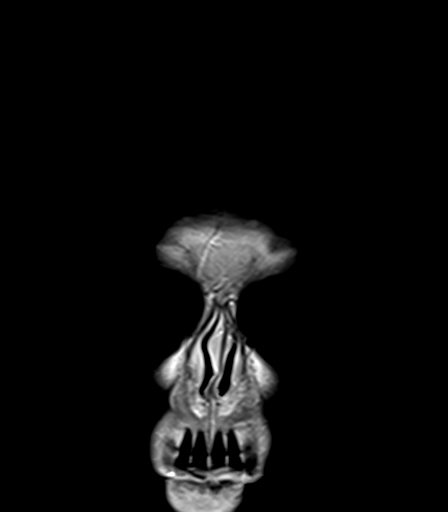

[Series 18: T1 post-contrast · sagittal · 5.0mm · 0.75mm/px · 1 of 24 slices shown (3 of 3)]
[im 1/24]
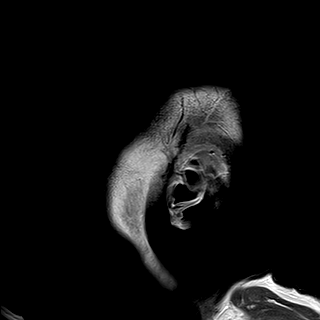

[48 of 48 positions shown; findings below may reference images not displayed]

FINDINGS: Brain: There is no evidence of acute intracranial hemorrhage,
extra-axial fluid collection, or acute infarct.

Parenchymal volume is normal. The ventricles are normal in size.
There is a minimal burden of chronic white matter microangiopathic
change.

There is no abnormal parenchymal enhancement.

Postsurgical changes reflecting left suboccipital craniectomy are
seen, likely related to the reported history of vestibular
schwannoma removal. There is a 0.9 cm by 1.0 cm enhancing lesion in
the left IAC. There is no other mass lesion. There is no mass effect
or midline shift.

Vascular: Normal flow voids.

Skull and upper cervical spine: Normal marrow signal.

Sinuses/Orbits: The paranasal sinuses are clear. The globes are
unremarkable. There is a tubular enhancing structure along the roof
of the left orbit measuring 2.2 cm x 1.1 cm which appears contiguous
with the superior ophthalmic vein anteriorly. There is no proptosis
or bulging or asymmetric enhancement of the left cavernous sinus.

Other: None.
IMPRESSION: 1. 0.9 cm x 1.0 cm enhancing lesion in the left IAC consistent with
a vestibular schwannoma.
2. Dilated left superior ophthalmic vein may reflect a venous varix.
A carotid cavernous fistula could have a similar appearance, but
there is no proptosis or asymmetry of the cavernous sinuses, making
this diagnosis less likely. Correlate with any left orbital
symptoms.

## 2022-01-16 MED ORDER — GADOBUTROL 1 MMOL/ML IV SOLN
8.0000 mL | Freq: Once | INTRAVENOUS | Status: AC | PRN
  Administered 2022-01-16: 8 mL via INTRAVENOUS

## 2022-01-16 MED ORDER — APIXABAN 5 MG PO TABS
5.0000 mg | ORAL_TABLET | Freq: Two times a day (BID) | ORAL | Status: DC
  Administered 2022-01-16 – 2022-01-19 (×7): 5 mg via ORAL
  Filled 2022-01-16 (×7): qty 1

## 2022-01-16 MED ORDER — ENSURE ENLIVE PO LIQD
237.0000 mL | Freq: Two times a day (BID) | ORAL | Status: DC
  Administered 2022-01-16 – 2022-01-19 (×5): 237 mL via ORAL

## 2022-01-16 MED ORDER — ADULT MULTIVITAMIN W/MINERALS CH
1.0000 | ORAL_TABLET | Freq: Every day | ORAL | Status: DC
Start: 2022-01-16 — End: 2022-01-19
  Administered 2022-01-16 – 2022-01-19 (×4): 1 via ORAL
  Filled 2022-01-16 (×4): qty 1

## 2022-01-16 NOTE — Evaluation (Addendum)
Physical Therapy Evaluation Patient Details Name: Latoya Parrish MRN: [id number] DOB: 14-May-1941 Today's Date: 01/16/2022  History of Present Illness  81 y.o. female with medical history significant of PAF, HTN, ischemic cardiomyopathy, vertigo, MVP, excision of acoustic neuroma (~30 years ago per pt). Presenting with right hip pain after fall. Dx: fractures of R inferior pubic ramus, sacral ala, and superior pubic root near acetabulum. Per ortho, Non operative management, WBAT.  Clinical Impression  Pt admitted with above diagnosis. +2 total assist for supine to sit. +2 mod assist sit to stand from elevated bed. +2 min A to take a few pivotal steps to recliner with max verbal cues for technique and increased time. Pt agreeable to ST-SNF. Pt reports dizziness started ~3 weeks ago. She did report dizziness with sit to stand, denied dizziness with rolling. Will plan to do vestibular evaluation tomorrow.  Pt currently with functional limitations due to the deficits listed below (see PT Problem List). Pt will benefit from skilled PT to increase their independence and safety with mobility to allow discharge to the venue listed below.          Recommendations for follow up therapy are one component of a multi-disciplinary discharge planning process, led by the attending physician.  Recommendations may be updated based on patient status, additional functional criteria and insurance authorization.  Follow Up Recommendations Skilled nursing-short term rehab (<3 hours/day)    Assistance Recommended at Discharge Frequent or constant Supervision/Assistance  Patient can return home with the following  A lot of help with walking and/or transfers;Assistance with cooking/housework;Assist for transportation;Help with stairs or ramp for entrance;A lot of help with bathing/dressing/bathroom    Equipment Recommendations None recommended by PT  Recommendations for Other Services       Functional Status  Assessment Patient has had a recent decline in their functional status and demonstrates the ability to make significant improvements in function in a reasonable and predictable amount of time.     Precautions / Restrictions Precautions Precautions: Fall Precaution Comments: fall just PTA, 1 onther fall in past 6 months (tripped at grocery store in August) Restrictions Weight Bearing Restrictions: No Other Position/Activity Restrictions: WBAT      Mobility  Bed Mobility Overal bed mobility: Needs Assistance Bed Mobility: Supine to Sit     Supine to sit: +2 for safety/equipment, Total assist     General bed mobility comments: +2 total to raise trunk and pivot hips to EOB (pt 10%)    Transfers Overall transfer level: Needs assistance Equipment used: Rolling walker (2 wheels) Transfers: Sit to/from Stand, Bed to chair/wheelchair/BSC Sit to Stand: From elevated surface, +2 safety/equipment, Mod assist   Step pivot transfers: +2 safety/equipment, Min assist       General transfer comment: VCs hand placement, assist to power up, increased time and max verbal cues for sequencing with RW to take a few pivotal steps to recliner, pt had difficulty advancing LLE    Ambulation/Gait                  Stairs            Wheelchair Mobility    Modified Rankin (Stroke Patients Only)       Balance Overall balance assessment: Needs assistance Sitting-balance support: Feet supported, No upper extremity supported Sitting balance-Leahy Scale: Fair     Standing balance support: Bilateral upper extremity supported Standing balance-Leahy Scale: Poor Standing balance comment: reliant upon BUE support  Pertinent Vitals/Pain Pain Assessment Pain Assessment: 0-10 Pain Score: 6  Pain Location: R pelvis with movement Pain Descriptors / Indicators: Grimacing, Guarding Pain Intervention(s): Limited activity within patient's tolerance,  Monitored during session, Premedicated before session, Repositioned    Home Living Family/patient expects to be discharged to:: Private residence Living Arrangements: Alone Available Help at Discharge: Family;Available PRN/intermittently Type of Home: Apartment Home Access: Level entry       Home Layout: One level Home Equipment: Conservation officer, nature (2 wheels);Cane - single point;Grab bars - tub/shower Additional Comments: Patient lives alone and is in the process of moving to [place].    Prior Function Prior Level of Function : Independent/Modified Independent             Mobility Comments: PTA lived independently in a senior apartment, was planning to move to [place]; walked with RW for past 2-3 weeks 2* onset of dizziness ADLs Comments: independent     Hand Dominance   Dominant Hand: Right    Extremity/Trunk Assessment   Upper Extremity Assessment Upper Extremity Assessment: Overall WFL for tasks assessed    Lower Extremity Assessment Lower Extremity Assessment: RLE deficits/detail RLE Deficits / Details: knee ext -3/5, hip 2/5 RLE: Unable to fully assess due to pain    Cervical / Trunk Assessment Cervical / Trunk Assessment: Normal  Communication   Communication: No difficulties  Cognition Arousal/Alertness: Awake/alert Behavior During Therapy: WFL for tasks assessed/performed Overall Cognitive Status: Within Functional Limits for tasks assessed                                          General Comments      Exercises  Ankle pumps x 10 both AROM supine   Assessment/Plan    PT Assessment Patient needs continued PT services  PT Problem List Decreased mobility;Decreased strength;Decreased activity tolerance;Decreased balance;Pain       PT Treatment Interventions Therapeutic activities;Gait training;Therapeutic exercise;Functional mobility training;Balance training;Patient/family education    PT Goals  (Current goals can be found in the Care Plan section)  Acute Rehab PT Goals Patient Stated Goal: to move into her ILF apartment at [facility], following rehab PT Goal Formulation: With patient Time For Goal Achievement: 01/30/22 Potential to Achieve Goals: Good    Frequency Min 3X/week     Co-evaluation PT/OT/SLP Co-Evaluation/Treatment: Yes Reason for Co-Treatment: To address functional/ADL transfers PT goals addressed during session: Mobility/safety with mobility;Proper use of DME         AM-PAC PT "6 Clicks" Mobility  Outcome Measure Help needed turning from your back to your side while in a flat bed without using bedrails?: Total Help needed moving from lying on your back to sitting on the side of a flat bed without using bedrails?: Total Help needed moving to and from a bed to a chair (including a wheelchair)?: A Lot Help needed standing up from a chair using your arms (e.g., wheelchair or bedside chair)?: A Lot Help needed to walk in hospital room?: A Lot Help needed climbing 3-5 steps with a railing? : Total 6 Click Score: 9    End of Session Equipment Utilized During Treatment: Gait belt Activity Tolerance: Patient limited by pain;Patient limited by fatigue Patient left: in chair;with chair alarm set;with call bell/phone within reach Nurse Communication: Mobility status;Need for lift equipment PT Visit Diagnosis: Difficulty in walking, not elsewhere classified (R26.2);Pain Pain - Right/Left:  Right Pain - part of body: Hip    Time: [id number] PT Time Calculation (min) (ACUTE ONLY): 27 min   Charges:   PT Evaluation $PT Eval Moderate Complexity: 1 Mod         Philomena Doheny PT 01/16/2022  Acute Rehabilitation Services Pager [phone] Office [phone]

## 2022-01-16 NOTE — Progress Notes (Signed)
Initial Nutrition Assessment  INTERVENTION:   -Ensure Plus High Protein po BID, each supplement provides 350 kcal and 20 grams of protein.   -Multivitamin with minerals daily  NUTRITION DIAGNOSIS:   Increased nutrient needs related to  (pelvic fractures) as evidenced by estimated needs.  GOAL:   Patient will meet greater than or equal to 90% of their needs  MONITOR:   PO intake, Supplement acceptance, Labs, Weight trends, I & O's  REASON FOR ASSESSMENT:   Consult Hip fracture protocol  ASSESSMENT:   81 y.o. female with medical history significant of PAF, HTN, ischemic cardiomyopathy, vertigo, MVP. Presenting with right hip pain after fall. Admitted for nondisplaced pelvic fractures.  Patient out of room at time of visit.  Per chart review, pt with pelvic fractures, ortho managing non-operatively.  No POs documented. Will order Ensure supplements to aid in healing.  Per weight records, weights have been trending down since 2020.  Medications reviewed.  Labs reviewed:  CBGs: 111 Low vitamin D  NUTRITION - FOCUSED PHYSICAL EXAM:  Unable to complete  Diet Order:   Diet Order             Diet Heart Room service appropriate? Yes; Fluid consistency: Thin  Diet effective now                   EDUCATION NEEDS:   Not appropriate for education at this time  Skin:  Skin Assessment: Reviewed RN Assessment  Last BM:  2/20  Height:   Ht Readings from Last 1 Encounters:  01/15/22 5\' 8"  (1.727 m)    Weight:   Wt Readings from Last 1 Encounters:  01/16/22 69 kg    BMI:  Body mass index is 23.13 kg/m.  Estimated Nutritional Needs:   Kcal:  1700-1900  Protein:  80-90g  Fluid:  1.7L/day  Clayton Bibles, MS, RD, LDN Inpatient Clinical Dietitian Contact information available via Amion

## 2022-01-16 NOTE — Plan of Care (Signed)

## 2022-01-16 NOTE — Progress Notes (Signed)
°  Progress Note   Patient: Latoya Parrish [id number] DOB: 1941/05/23 DOA: 01/15/2022     Hospitalization day: 0 DOS: the patient was seen and examined on 01/16/2022   Brief hospital course: Latoya Parrish is a 81 y.o. female with medical history significant of PAF, HTN, ischemic cardiomyopathy, vertigo, MVP. Presenting with right hip pain after fall.  Assessment and Plan: Unwitnessed fall. Pelvic rami fracture. Pain is well controlled. Orthopedic recommends weightbearing as tolerated and nonoperative management. PT OT consult.  Monitor recommendation.  Vertigo. History of caustic neuroma. We will get MRI brain with and without contrast due to ongoing symptoms as well as vertigo episode which likely brought the patient to the hospital. no focal deficit. Scheduled to see ENT outpatient.  Will discuss with them tomorrow.  PAF. HTN Cardiomyopathy HLD Mitral valve prolapse. On home regimen. Monitor.  Subjective: No nausea no vomiting no fever no chills.  Ongoing vertigo.  No focal deficit.  No diarrhea.  Physical Exam: Vitals:   01/16/22 0231 01/16/22 0500 01/16/22 0631 01/16/22 1623  BP: 138/65  116/62 (!) 157/78  Pulse: 86  88 91  Resp: 18  18 17   Temp: 98.3 F (36.8 C)  97.9 F (36.6 C) 97.9 F (36.6 C)  TempSrc: Oral  Oral   SpO2: 95%  93% 97%  Weight:  69 kg    Height:       General: Appear in mild distress; no visible Abnormal Neck Mass Or lumps, Conjunctiva normal Cardiovascular: S1 and S2 Present, no Murmur, Respiratory: good respiratory effort, Bilateral Air entry present and CTA, no Crackles, no wheezes Abdomen: Bowel Sound present Extremities: no Pedal edema Neurology: alert and oriented to time, place, and person, history of left facial surgery, horizontal nystagmus Gait not checked due to patient safety concerns   Data Reviewed:  I have Reviewed nursing notes, Vitals, and Lab results since pt's last encounter. Pertinent lab results CBC, BMP I have  ordered test including CBC, BMP I have ordered imaging studies MRI brain with and without contrast.   Family Communication: None at bedside  Disposition: Status is: Observation  Author: , MD 01/16/2022 8:06 PM  For on call review [email or web address].

## 2022-01-16 NOTE — Evaluation (Signed)
Occupational Therapy Evaluation Patient Details Name: Latoya Parrish MRN: [id number] DOB: Feb 09, 1941 Today's Date: 01/16/2022   History of Present Illness 81 y.o. female with medical history significant of PAF, HTN, ischemic cardiomyopathy, vertigo, MVP, excision of acoustic neuroma (~30 years ago per pt). Presenting with right hip pain after fall. Dx: fractures of R inferior pubic ramus, sacral ala, and superior pubic root near acetabulum. Per ortho, Non operative management, WBAT.   Clinical Impression   [name] is an 81 year old woman s/p fall at home resulting in pelvic fractures who now presents with decreased ROM and strength of RLE, generalized weakness, decreased activity tolerance, impaired balance and pain resulting in a sudden decline in functional abilities. She required near total assist to transfer to side of bed due to pain mod assist to rise. She was only able to transfer to recliner with walker, +2 assistance for safety and managing room, and min assist. She exhibited difficulty maneuvering LLE, using walker effectively and motor planning to take functional steps. Inevitably a chair had to be pulled up behind her after a few steps with right foot. She requires increased assistance with ADLs and predominantly seated position due to limitations with mobility. Patient will benefit from skilled OT services while in hospital to improve deficits and learn compensatory strategies as needed in order to return to PLOF.  Recommend short term rehab at discharge.     Recommendations for follow up therapy are one component of a multi-disciplinary discharge planning process, led by the attending physician.  Recommendations may be updated based on patient status, additional functional criteria and insurance authorization.   Follow Up Recommendations  Skilled nursing-short term rehab (<3 hours/day)    Assistance Recommended at Discharge Frequent or constant Supervision/Assistance   Patient can return home with the following A lot of help with bathing/dressing/bathroom;Two people to help with walking and/or transfers;Assistance with cooking/housework;Help with stairs or ramp for entrance    Functional Status Assessment  Patient has had a recent decline in their functional status and demonstrates the ability to make significant improvements in function in a reasonable and predictable amount of time.  Equipment Recommendations  Other (comment) (Defer to next venue)    Recommendations for Other Services       Precautions / Restrictions Precautions Precautions: Fall Precaution Comments: fall just PTA, 1 other fall in past 6 months (tripped at grocery store in August), has left sided facial palsy Restrictions Weight Bearing Restrictions: No Other Position/Activity Restrictions: WBAT      Mobility Bed Mobility                    Transfers                          Balance Overall balance assessment: Needs assistance Sitting-balance support: No upper extremity supported, Feet supported       Standing balance support: Bilateral upper extremity supported, Reliant on assistive device for balance Standing balance-Leahy Scale: Poor Standing balance comment: reliant upon BUE support                           ADL either performed or assessed with clinical judgement   ADL Overall ADL's : Needs assistance/impaired Eating/Feeding: Set up;Sitting   Grooming: Set up;Sitting   Upper Body Bathing: Set up;Sitting   Lower Body Bathing: Moderate assistance;Sit to/from stand   Upper Body Dressing : Set up;Sitting  Lower Body Dressing: Maximal assistance;Sit to/from stand   Toilet Transfer: Moderate assistance;+2 for safety/equipment;Rolling walker (2 wheels);BSC/3in1   Toileting- Clothing Manipulation and Hygiene: Maximal assistance;Sitting/lateral lean       Functional mobility during ADLs: Moderate assistance;+2 for  safety/equipment General ADL Comments: Patient total assist to transfer to edge of bed via helicopter method and +2 physucak assistance - due to pain when she attempted herself.  Patient mod assist for sit to stand from elevated bed height with use of walker. MIn assist to take 3 steps to recilner - requiring +2 assistance for safety and. Patient able to take  short steps with right foot but could not step with left foot  - also with difficulty compensating with UEs on walker. She did well standing and holding on to walker but could not functionally take steps and inevitably recliner had to be pulled up behind her.     Vision Patient Visual Report: No change from baseline Additional Comments: Left eye blurry at times , cannot blink due to facial nerve palsy from a previous surgery     Perception     Praxis      Pertinent Vitals/Pain Pain Assessment Pain Assessment: 0-10 Pain Score: 6  Pain Location: R pelvis with weight bearing Pain Descriptors / Indicators: Grimacing, Guarding Pain Intervention(s): Limited activity within patient's tolerance, Premedicated before session     Hand Dominance Right   Extremity/Trunk Assessment Upper Extremity Assessment Upper Extremity Assessment: RUE deficits/detail;LUE deficits/detail RUE Deficits / Details: WFL ROM, 3+/5 shoulder, 4-/5 elbow, 5/5 wrist, 4/5 grip RUE Sensation: WNL RUE Coordination: WNL LUE Deficits / Details: WFL ROM, 3+/5 shoulder, 4-/5 elbow, 5/5 wrist, 4/5 grip LUE Sensation: WNL LUE Coordination: WNL   Lower Extremity Assessment Lower Extremity Assessment: Defer to PT evaluation RLE Deficits / Details: knee ext -3/5, hip 2/5 RLE: Unable to fully assess due to pain   Cervical / Trunk Assessment Cervical / Trunk Assessment: Normal   Communication Communication Communication: No difficulties   Cognition Arousal/Alertness: Awake/alert Behavior During Therapy: WFL for tasks assessed/performed Overall Cognitive Status:  Within Functional Limits for tasks assessed                                       General Comments       Exercises     Shoulder Instructions      Home Living Family/patient expects to be discharged to:: Private residence Living Arrangements: Alone Available Help at Discharge: Family;Available PRN/intermittently Type of Home: Apartment Home Access: Level entry     Home Layout: One level     Bathroom Shower/Tub: Walk-in shower         Home Equipment: Agricultural consultant (2 wheels);Cane - single point;Grab bars - tub/shower   Additional Comments: Patient lives alone and is in the process of moving to [facility].      Prior Functioning/Environment Prior Level of Function : Independent/Modified Independent             Mobility Comments: PTA lived independently in a senior apartment, was planning to move to [facility] IL[facility]; walked with RW for past 2-3 weeks 2* onset of dizziness ADLs Comments: independent        OT Problem List: Decreased strength;Decreased range of motion;Decreased activity tolerance;Impaired balance (sitting and/or standing);Decreased knowledge of use of DME or AE;Pain      OT Treatment/Interventions: Self-care/ADL training;Therapeutic exercise;DME and/or AE instruction;Therapeutic  activities;Balance training;Patient/family education    OT Goals(Current goals can be found in the care plan section) Acute Rehab OT Goals Patient Stated Goal: go to rehab OT Goal Formulation: With patient Time For Goal Achievement: 01/30/22 Potential to Achieve Goals: Good  OT Frequency: Min 2X/week    Co-evaluation PT/OT/SLP Co-Evaluation/Treatment: Yes (co-eval) Reason for Co-Treatment: To address functional/ADL transfers PT goals addressed during session: Mobility/safety with mobility;Proper use of DME        AM-PAC OT "6 Clicks" Daily Activity     Outcome Measure Help from another person eating meals?: A Little Help from  another person taking care of personal grooming?: A Little Help from another person toileting, which includes using toliet, bedpan, or urinal?: A Lot Help from another person bathing (including washing, rinsing, drying)?: A Lot Help from another person to put on and taking off regular upper body clothing?: A Little Help from another person to put on and taking off regular lower body clothing?: A Lot 6 Click Score: 15   End of Session Equipment Utilized During Treatment: Rolling walker (2 wheels);Gait belt Nurse Communication: Mobility status  Activity Tolerance: Patient limited by pain Patient left: in chair;with call bell/phone within reach;with chair alarm set  OT Visit Diagnosis: Other abnormalities of gait and mobility (R26.89);Pain                Time: 0981-1914 OT Time Calculation (min): 28 min Charges:  OT General Charges $OT Visit: 1 Visit OT Evaluation $OT Eval Low Complexity: 1 Low  Joia Doyle, OTR/L Acute Care Rehab Services  Office [phone] Pager: [phone]   Kelli Churn 01/16/2022, 12:23 PM

## 2022-01-16 NOTE — Consult Note (Signed)
ORTHOPAEDIC CONSULTATION  REQUESTING PHYSICIAN: Lavina Hamman, MD  Chief Complaint: pelvic fractures  HPI: Latoya Parrish is a 81 y.o. female with of PAF, HTN, ischemic cardiomyopathy, vertigo, MVP. Patient had a fall on 01/15/2022 where she landed on her right hip. She had difficulty ambulating after this. She was taken to [facility] for further evaluation. Orthopedic work up was significant for pelvic fractures. Orthopedics was consulted.  Patient was admitted to the hospitalist service. Patient was seen on the 5th floor this AM. She states her pain has improved with pain medication. Has pain in the right side with movement. She normally ambulates with a walker due to vertigo. She is planning to move to [facility] in April. She is hoping she can go there sooner for rehab if rehab is necessary.  Past Medical History:  Diagnosis Date   Anemia    Aortic insufficiency    Mild by echo 09/2020   Aortic stenosis    mild by echo 09/2019 but not present on echo 09/2020   Arthritis    Chicken pox    Heart attack ([place]) 2006   nontransmural MI seconday to vasispasm w normal coronary arteries   High cholesterol    Ischemic dilated cardiomyopathy ([place])    resolved   Mitral valve prolapse    with mild MR by echo 10/2015   White coat hypertension    Past Surgical History:  Procedure Laterality Date   acoustic neuroma   1991   ANKLE SURGERY  2009   CHOLECYSTECTOMY  1990   facial plastic surgery  [place]   Social History   Socioeconomic History   Marital status: Widowed    Spouse name: Not on file   Number of children: Not on file   Years of education: Not on file   Highest education level: Not on file  Occupational History   Occupation: teacher  Tobacco Use   Smoking status: Never   Smokeless tobacco: Never  Substance and Sexual Activity   Alcohol use: Yes    Alcohol/week: 7.0 standard drinks    Types: 7 Glasses of wine  per week    Comment: 1 daily   Drug use: No   Sexual activity: Not on file  Other Topics Concern   Not on file  Social History Narrative   Widowed in 2012    Working part time   Social Determinants of Radio broadcast assistant Strain: Not on file  Food Insecurity: Not on file  Transportation Needs: Not on file  Physical Activity: Not on file  Stress: Not on file  Social Connections: Not on file   Family History  Problem Relation Age of Onset   Hypertension Father    COPD Father    Hypertension Sister    Allergies  Allergen Reactions   Fosamax [Alendronate Sodium]     Dental problems   Statins     Muscle pain    Ace Inhibitors Cough   Prior to Admission medications   Medication Sig Start Date End Date Taking? Authorizing Provider  apixaban (ELIQUIS) 5 MG TABS tablet Take 1 tablet (5 mg total) by mouth 2 (two) times daily. 09/26/21  Yes Turner, Eber Hong, MD  Calcium Carbonate-Vitamin D (CALCIUM-VITAMIN D) 500-200 MG-UNIT per tablet Take 1 tablet by mouth daily.    Yes [provider]  carvedilol (COREG) 6.25 MG tablet Take 1 tablet (6.25 mg total) by mouth 2 (two)  times daily. 09/26/21  Yes Turner, Eber Hong, MD  diltiazem (CARDIZEM) 30 MG tablet Take 1 tablet every 4 hours AS NEEDED for AFIB heart rate >100 09/26/21  Yes Turner, Eber Hong, MD  diphenhydrAMINE (BENADRYL) 25 MG tablet Take 25 mg by mouth at bedtime as needed for allergies.   Yes [provider]  furosemide (LASIX) 20 MG tablet Take 1 tablet (20 mg total) by mouth as needed. Patient taking differently: Take 20 mg by mouth daily as needed for fluid or edema. 09/26/21 05/24/22 Yes Turner, Eber Hong, MD  losartan (COZAAR) 50 MG tablet Take 1 tablet (50 mg total) by mouth daily. 09/26/21  Yes Turner, Eber Hong, MD  meclizine (ANTIVERT) 25 MG tablet Take 1 tablet (25 mg total) by mouth 3 (three) times daily as needed for dizziness. 12/08/21  Yes Plunkett, Loree Fee, MD  vitamin B-12 (CYANOCOBALAMIN) 100 MCG  tablet Take 100 mcg by mouth daily.   Yes [provider]  White Petrolatum-Mineral Oil (LUBRICANT EYE NIGHTTIME) OINT Apply 1 application to eye in the morning and at bedtime.   Yes [provider]  ondansetron (ZOFRAN-ODT) 4 MG disintegrating tablet Take 1 tablet (4 mg total) by mouth every 8 (eight) hours as needed for nausea or vomiting. 12/08/21   Blanchie Dessert, MD   DG Chest 1 View  Result Date: 01/15/2022 CLINICAL DATA:  An 81 year old female presents following fall. EXAM: CHEST  1 VIEW COMPARISON:  July 15, 2019 FINDINGS: Image rotated to the LEFT. Accounting for this cardiomediastinal contours and hilar structures are stable. Lungs are clear. No visible pneumothorax. On limited assessment there is no visible acute bony abnormality. IMPRESSION: No acute cardiopulmonary disease. Electronically Signed   By: Zetta Bills M.D.   On: 01/15/2022 10:02   CT Hip Right Wo Contrast  Result Date: 01/15/2022 CLINICAL DATA:  Hip trauma, fracture suspected, xray done EXAM: CT OF THE RIGHT HIP WITHOUT CONTRAST TECHNIQUE: Multidetector CT imaging of the right hip was performed according to the standard protocol. Multiplanar CT image reconstructions were also generated. RADIATION DOSE REDUCTION: This exam was performed according to the departmental dose-optimization program which includes automated exposure control, adjustment of the mA and/or kV according to patient size and/or use of iterative reconstruction technique. COMPARISON:  Same day radiograph and 05/27/2012 FINDINGS: Bones/Joint/Cartilage There is a minimally displaced right inferior pubic ramus fracture. There is a nondisplaced right sacral ala fracture in zone 1 which extends inferiorly in close proximity to the anterior aspect of the SI joint. Additionally, there is a nondisplaced fracture at the superior pubic root near the acetabulum (coronal image 31, axial image 91). There is no evidence of a femoral neck fracture. There  is severe right hip osteoarthritis with bulky osteophyte formation, subchondral sclerosis and cystic change, which has by markedly progressed since 2013. Ligaments Suboptimally assessed by CT. Muscles and Tendons There is severe gluteus minimus muscle atrophy. No intramuscular collection. Soft tissues Soft tissue swelling adjacent to the fractures. Sigmoid diverticulosis. IMPRESSION: Minimally displaced right inferior pubic ramus fracture. Nondisplaced right sacral ala fracture in zone 1 extending inferiorly in close proximity to the anterior aspect of the sacroiliac joint. Nondisplaced fracture at the superior pubic root near the acetabulum. No evidence of femoral neck fracture. Severe right hip osteoarthritis, markedly progressed since 2013. Electronically Signed   By: Maurine Simmering M.D.   On: 01/15/2022 12:11   DG Hip Unilat With Pelvis 2-3 Views Right  Result Date: 01/15/2022 CLINICAL DATA:  W a female age 48  presents following fall with hip injury, fall to RIGHT hip with pain on movement. EXAM: DG HIP (WITH OR WITHOUT PELVIS) 2-3V RIGHT COMPARISON:  Comparison made with May 27, 2012. FINDINGS: Marked interval worsening of degenerative change about the RIGHT hip since previous imaging, bone on bone contact is noted. Osteopenia limits assessment. No visible fracture. Lateral projection limited by cross-table projection. No signs of fracture of the bony pelvis. IMPRESSION: Marked interval worsening of degenerative change about the RIGHT hip. No visible fracture. Limited assessment due to osteopenia and cross-table projection. If there is high clinical suspicion for fracture CT may be helpful for further evaluation. Electronically Signed   By: Zetta Bills M.D.   On: 01/15/2022 10:00   Family History Reviewed and non-contributory, no pertinent history of problems with bleeding or anesthesia      Review of Systems 14 system ROS conducted and negative except for that noted in  HPI   OBJECTIVE  Vitals:Patient Vitals for the past 8 hrs:  BP Temp Temp src Pulse Resp SpO2 Weight  01/16/22 0631 116/62 97.9 F (36.6 C) Oral 88 18 93 % --  01/16/22 0500 -- -- -- -- -- -- 69 kg  01/16/22 0231 138/65 98.3 F (36.8 C) Oral 86 18 95 % --   General: Alert, no acute distress Cardiovascular: Warm extremities noted Respiratory: No cyanosis, no use of accessory musculature GI: No organomegaly, abdomen is soft and non-tender Skin: No lesions in the area of chief complaint other than those listed below in MSK exam.  Neurologic: Sensation intact distally save for the below mentioned MSK exam Psychiatric: Patient is competent for consent with normal mood and affect Lymphatic: No swelling obvious and reported other than the area involved in the exam below  Extremities  Bilateral upper extremity: actively moves upper extremity without pain. Non-tender to palpation shoulder, elbow, wrist, hand. Full ROM. Small abrasion right elbow. NVI.  RLE: Negative log roll. Tender to palpation right hip. ROM of right hip not tested due to pain. Non tender to palpation right knee. Pain in hip with ROM of the knee. intact EHL/TA/GSC. Warm well perfused foot. Compartments soft and compressible, with no pain on passive stretch. LLE: No swelling, deformity, or effusion. Skin intact. Nontender to palpation, with full and painless ROM throughout. + GS/TA/EHL. Sensation intact in DP/SP/S/S/P distributions. 2+ DP pulse with warm and well perfused digits. Compartments soft and compressible, with no pain on passive stretch.  Test Results Imaging CT of right hip demonstrates " Minimally displaced right inferior pubic ramus fracture.   Nondisplaced right sacral ala fracture in zone 1 extending inferiorly in close proximity to the anterior aspect of the sacroiliac joint.   Nondisplaced fracture at the superior pubic root near the acetabulum.   No evidence of femoral neck fracture. Severe right hip  osteoarthritis, markedly progressed since 2013."   Labs cbc Recent Labs    01/15/22 1951 01/16/22 0515  WBC 8.1 7.2  HGB 9.6* 9.6*  HCT 30.8* 30.5*  PLT 171 169    Labs inflam No results for input(s): CRP in the last 72 hours.  Invalid input(s): ESR  Labs coag Recent Labs    01/15/22 0914  INR 1.1    Recent Labs    01/15/22 0914 01/15/22 1951 01/16/22 0515  NA 136  --  135  K 3.9  --  4.2  CL 107  --  108  CO2 24  --  23  GLUCOSE 102*  --  103*  BUN 13  --  14  CREATININE 0.97 0.96 0.79  CALCIUM 8.4*  --  7.9*     ASSESSMENT AND PLAN: 81 y.o. female with the following: nondisplaced pelvic fractures  Patient has been admitted to the hospitalist service due to vertigo, PT/OT evals, and discharge planning.   - Non-operative management - Weight Bearing Status/Activity: WBAT - Additional recommendations: PT/OT - VTE Prophylaxis: per medicine - Pain control: PRN, per medicine - Follow-up plan: 4 weeks in office  Orthopedics will be available for any additional questions or concerns. From orthopedic standpoint okay to discharge once cleared by medicine and therapies.   Patient is hoping if she requires short term rehab that she can do this at [facility]. Will make TOC aware.   Noemi Chapel, PA-C 01/16/22,

## 2022-01-17 DIAGNOSIS — R296 Repeated falls: Secondary | ICD-10-CM | POA: Diagnosis not present

## 2022-01-17 DIAGNOSIS — W19XXXA Unspecified fall, initial encounter: Secondary | ICD-10-CM | POA: Diagnosis not present

## 2022-01-17 MED ORDER — TAMSULOSIN HCL 0.4 MG PO CAPS
0.4000 mg | ORAL_CAPSULE | Freq: Every day | ORAL | Status: DC
  Administered 2022-01-17 – 2022-01-19 (×3): 0.4 mg via ORAL
  Filled 2022-01-17 (×3): qty 1

## 2022-01-17 MED ORDER — HYDROCODONE-ACETAMINOPHEN 5-325 MG PO TABS
1.0000 | ORAL_TABLET | Freq: Four times a day (QID) | ORAL | Status: DC | PRN
  Administered 2022-01-17 – 2022-01-18 (×2): 2 via ORAL
  Administered 2022-01-18: 1 via ORAL
  Administered 2022-01-18 – 2022-01-19 (×3): 2 via ORAL
  Filled 2022-01-17 (×3): qty 2
  Filled 2022-01-17: qty 1
  Filled 2022-01-17 (×2): qty 2

## 2022-01-17 MED ORDER — CARVEDILOL 6.25 MG PO TABS
6.2500 mg | ORAL_TABLET | Freq: Two times a day (BID) | ORAL | Status: DC
  Administered 2022-01-17 – 2022-01-19 (×5): 6.25 mg via ORAL
  Filled 2022-01-17 (×5): qty 1

## 2022-01-17 NOTE — TOC Initial Note (Signed)
Transition of Care [facility]) - Initial/Assessment Note    Patient Details  Name: Latoya Parrish MRN: [id number] Date of Birth: December 18, 1940  [facility]) CM/SW Contact:    Ida Rogue, LCSW Phone Number: 01/17/2022, 3:39 PM  Clinical Narrative:   Patient seen in follow up to PT recommendation of SNF.  Ms Vohs lives alone in an apartment here in [place], is fully invested in rehab.  Is hoping to get into [facility] as she has already beguan the application process, but also acknowledges that she knows that may not come together and is open to referral to other facilities of [facility] does not come through.  Bed search process explained and initiated. Left message for daughter per patient request. TOC will continue to follow during the course of hospitalization.                 Expected Discharge Plan: Skilled Nursing Facility Barriers to Discharge: SNF Pending bed offer   Patient Goals and CMS Choice Patient states their goals for this hospitalization and ongoing recovery are:: Go to rehab CMS Medicare.gov Compare Post Acute Care list provided to:: Patient Choice offered to / list presented to : Patient  Expected Discharge Plan and Services Expected Discharge Plan: Skilled Nursing Facility   Discharge Planning Services: CM Consult   Living arrangements for the past 2 months: Apartment                                      Prior Living Arrangements/Services Living arrangements for the past 2 months: Apartment Lives with:: Self Patient language and need for interpreter reviewed:: Yes        Need for Family Participation in Patient Care: Yes (Comment) Care giver support system in place?: Yes (comment) Current home services: DME Criminal Activity/Legal Involvement Pertinent to Current Situation/Hospitalization: No - Comment as needed  Activities of Daily Living Home Assistive Devices/Equipment: Walker (specify type) ADL Screening (condition at time of  admission) Patient's cognitive ability adequate to safely complete daily activities?: Yes Is the patient deaf or have difficulty hearing?: Yes Does the patient have difficulty seeing, even when wearing glasses/contacts?: No Does the patient have difficulty concentrating, remembering, or making decisions?: No Patient able to express need for assistance with ADLs?: Yes Does the patient have difficulty dressing or bathing?: No Independently performs ADLs?: Yes (appropriate for developmental age) Does the patient have difficulty walking or climbing stairs?: Yes Weakness of Legs: Left Weakness of Arms/Hands: None  Permission Sought/Granted   Permission granted to share information with : Yes, Verbal Permission Granted  Share Information with NAME: Jetty Peeks (Daughter)   [phone]           Emotional Assessment Appearance:: Appears stated age Attitude/Demeanor/Rapport: Engaged Affect (typically observed): Appropriate Orientation: : Oriented to Self, Oriented to Place, Oriented to  Time, Oriented to Situation Alcohol / Substance Use: Not Applicable Psych Involvement: No (comment)  Admission diagnosis:  SOB (shortness of breath) [R06.02] Fall [W19.XXXA] Closed nondisplaced fracture of pelvis, unspecified part of pelvis, initial encounter (HCC) [S32.9XXA] Patient Active Problem List   Diagnosis Date Noted   Fall 01/15/2022   Secondary hypercoagulable state (HCC) 03/02/2020   Aortic stenosis    Paroxysmal atrial fibrillation (HCC) 07/26/2019   Screening mammogram, encounter for 01/25/2019   Knee pain, left 07/16/2017   Mitral valve prolapse    Encounter for Medicare annual wellness exam 01/12/2016   Routine  general medical examination at a health care facility 01/12/2016   Estrogen deficiency 01/12/2016   Breast cancer screening 01/12/2016   Eight cranial nerve disorder 07/24/2015   Poor balance 06/06/2015   Ataxia 06/06/2015   MI, old 05/22/2015   Aortic insufficiency     Vertigo 11/09/2013   S/P excision of acoustic neuroma 11/09/2013   Swelling of right ankle joint 06/28/2013   Sprain of right knee 06/28/2013   Sciatica of left side 05/21/2013   Hip pain, bilateral 05/27/2012   Other screening mammogram 05/27/2012   ANKLE PAIN, RIGHT 11/16/2007   FRACTURE, TIBIA 11/16/2007   HIP PAIN 04/29/2007   Osteoporosis 04/29/2007   HYPERCHOLESTEROLEMIA 04/28/2007   DEPRESSION 04/28/2007   DECREASED HEARING, LEFT EAR 04/28/2007   Essential hypertension 04/28/2007   Mitral valve disorder 04/28/2007   PCP:  Melida Quitter, MD Pharmacy:   [id number] [place], [place] - [address] [place] [address] Phone: [phone] Fax: [phone]     Social Determinants of Health (SDOH) Interventions    Readmission Risk Interventions No flowsheet data found.

## 2022-01-17 NOTE — Progress Notes (Signed)
Physical Therapy Treatment Patient Details Name: Latoya Parrish MRN: [id number] DOB: [birth year] Today's Date: 01/17/2022   History of Present Illness 81 y.o. female with medical history significant of PAF, HTN, ischemic cardiomyopathy, vertigo, MVP, excision of acoustic neuroma (~30 years ago per pt). Presenting with right hip pain after fall. Dx: fractures of R inferior pubic ramus, sacral ala, and superior pubic root near acetabulum. Per ortho, Non operative management, WBAT.  2/22 MRI brain: "0.9 cm x 1.0 cm enhancing lesion in the left IAC consistent with  a vestibular schwannoma."    PT Comments    Pt not yet aware of results for MRI however left acoustic neuroma present (which was not discussed with pt or family), and Dr. Posey Pronto attempting to contact ENT at time of session.  Pt assisted OOB to recliner and reports increased pain with Rt LE movement. Pt felt unable to tolerate ambulating at this time due to pain and right leg with poor functional movement.  Pt reports her dizziness feels at baseline however and did NOT become worse during mobility/session. Continue to recommend SNF upon d/c.  Daughter present and hopeful for d/c to SNF section of [facility] ([place] notified).    Recommendations for follow up therapy are one component of a multi-disciplinary discharge planning process, led by the attending physician.  Recommendations may be updated based on patient status, additional functional criteria and insurance authorization.  Follow Up Recommendations  Skilled nursing-short term rehab (<3 hours/day)     Assistance Recommended at Discharge Frequent or constant Supervision/Assistance  Patient can return home with the following A lot of help with walking and/or transfers;Assistance with cooking/housework;Assist for transportation;Help with stairs or ramp for entrance;A lot of help with bathing/dressing/bathroom   Equipment Recommendations  None recommended by PT    Recommendations for  Other Services       Precautions / Restrictions Precautions Precautions: Fall Precaution Comments: left acoustic neuroma per 2/22 brain MRI; hx of acoustic neuroma and removal, left facial palsy Restrictions Other Position/Activity Restrictions: WBAT     Mobility  Bed Mobility Overal bed mobility: Needs Assistance Bed Mobility: Supine to Sit     Supine to sit: Mod assist, +2 for safety/equipment     General bed mobility comments: pt requiring increased time however very determined to attempt to perform without assist; did required assist for Rt LE and bringing trunk upright    Transfers Overall transfer level: Needs assistance Equipment used: Rolling walker (2 wheels) Transfers: Sit to/from Stand, Bed to chair/wheelchair/BSC Sit to Stand: Min assist, +2 safety/equipment Stand pivot transfers: Min assist, +2 safety/equipment, From elevated surface         General transfer comment: verbal cues for UE and LE positioning, weight shifting; pt with difficulty taking steps so performed with pivoting    Ambulation/Gait               General Gait Details: pt did not feel able to perform today   Stairs             Wheelchair Mobility    Modified Rankin (Stroke Patients Only)       Balance                                            Cognition Arousal/Alertness: Awake/alert Behavior During Therapy: WFL for tasks assessed/performed Overall Cognitive Status: Within Functional Limits for tasks assessed  Exercises      General Comments        Pertinent Vitals/Pain Pain Assessment Pain Assessment: 0-10 Pain Score: 4  Pain Location: R pelvis with weight bearing Pain Descriptors / Indicators: Grimacing, Guarding Pain Intervention(s): Repositioned, Monitored during session    Home Living                          Prior Function            PT Goals (current  goals can now be found in the care plan section) Progress towards PT goals: Progressing toward goals    Frequency    Min 2X/week      PT Plan Current plan remains appropriate    Co-evaluation              AM-PAC PT "6 Clicks" Mobility   Outcome Measure  Help needed turning from your back to your side while in a flat bed without using bedrails?: A Lot Help needed moving from lying on your back to sitting on the side of a flat bed without using bedrails?: A Lot Help needed moving to and from a bed to a chair (including a wheelchair)?: A Lot Help needed standing up from a chair using your arms (e.g., wheelchair or bedside chair)?: A Lot Help needed to walk in hospital room?: Total Help needed climbing 3-5 steps with a railing? : Total 6 Click Score: 10    End of Session Equipment Utilized During Treatment: Gait belt Activity Tolerance: Patient tolerated treatment well Patient left: in chair;with call bell/phone within reach;with chair alarm set;with family/visitor present Nurse Communication: Mobility status PT Visit Diagnosis: Difficulty in walking, not elsewhere classified (R26.2);Pain Pain - Right/Left: Right Pain - part of body: Hip     Time: 1147-1206 PT Time Calculation (min) (ACUTE ONLY): 19 min  Charges:  $Therapeutic Activity: 8-22 mins                     Jannette Spanner PT, DPT Acute Rehabilitation Services Pager: [phone] Office: [phone]    Kati L Payson 01/17/2022, 1:01 PM

## 2022-01-17 NOTE — NC FL2 (Signed)
Eriq Hufford Ignacio MEDICAID FL2 LEVEL OF CARE SCREENING TOOL     IDENTIFICATION  Patient Name: Latoya Parrish Birthdate: August 02, 1941 Sex: female Admission Date (Current Location): 01/15/2022  [facility] and [place] Number:  Producer, television/film/video and Address:  [facility],  [address], [place] [address]      Provider Number: [id number]  Attending Physician Name and Address:  Rolly Salter, MD  Relative Name and Phone Number:  Jetty Peeks (Daughter)   [phone]    Current Level of Care: Hospital Recommended Level of Care: Skilled Nursing Facility Prior Approval Number:    Date Approved/Denied:   PASRR Number: [id number]  Discharge Plan: SNF    Current Diagnoses: Patient Active Problem List   Diagnosis Date Noted   Fall 01/15/2022   Secondary hypercoagulable state (HCC) 03/02/2020   Aortic stenosis    Paroxysmal atrial fibrillation (HCC) 07/26/2019   Screening mammogram, encounter for 01/25/2019   Knee pain, left 07/16/2017   Mitral valve prolapse    Encounter for Medicare annual wellness exam 01/12/2016   Routine general medical examination at a health care facility 01/12/2016   Estrogen deficiency 01/12/2016   Breast cancer screening 01/12/2016   Eight cranial nerve disorder 07/24/2015   Poor balance 06/06/2015   Ataxia 06/06/2015   MI, old 05/22/2015   Aortic insufficiency    Vertigo 11/09/2013   S/P excision of acoustic neuroma 11/09/2013   Swelling of right ankle joint 06/28/2013   Sprain of right knee 06/28/2013   Sciatica of left side 05/21/2013   Hip pain, bilateral 05/27/2012   Other screening mammogram 05/27/2012   ANKLE PAIN, RIGHT 11/16/2007   FRACTURE, TIBIA 11/16/2007   HIP PAIN 04/29/2007   Osteoporosis 04/29/2007   HYPERCHOLESTEROLEMIA 04/28/2007   DEPRESSION 04/28/2007   DECREASED HEARING, LEFT EAR 04/28/2007   Essential hypertension 04/28/2007   Mitral valve disorder 04/28/2007    Orientation RESPIRATION BLADDER Height &  Weight     Self, Time, Situation, Place  Normal External catheter Weight: 69 kg Height:  5\' 8"  (172.7 cm)  BEHAVIORAL SYMPTOMS/MOOD NEUROLOGICAL BOWEL NUTRITION STATUS      Continent Diet (see d/c summary)  AMBULATORY STATUS COMMUNICATION OF NEEDS Skin   Extensive Assist Verbally Normal                       Personal Care Assistance Level of Assistance  Bathing, Feeding, Dressing Bathing Assistance: Maximum assistance Feeding assistance: Independent Dressing Assistance: Limited assistance     Functional Limitations Info  Sight, Hearing, Speech Sight Info: Adequate Hearing Info: Adequate Speech Info: Adequate    SPECIAL CARE FACTORS FREQUENCY  PT (By licensed PT), OT (By licensed OT)     PT Frequency: 5X/W OT Frequency: 5X/W            Contractures Contractures Info: Not present    Additional Factors Info  Code Status Code Status Info: DNR             Current Medications (01/17/2022):  This is the current hospital active medication list Current Facility-Administered Medications  Medication Dose Route Frequency Provider Last Rate Last Admin   0.9 %  sodium chloride infusion   Intravenous Continuous Rolly Salter, MD 75 mL/hr at 01/17/22 0043 New Bag at 01/17/22 0043   apixaban (ELIQUIS) tablet 5 mg  5 mg Oral BID Rolly Salter, MD   5 mg at 01/17/22 8469   docusate sodium (COLACE) capsule 100 mg  100 mg Oral BID Ronaldo Miyamoto,  Tyrone A, DO   100 mg at 01/15/22 2040   feeding supplement (ENSURE ENLIVE / ENSURE PLUS) liquid 237 mL  237 mL Oral BID BM Rolly Salter, MD   237 mL at 01/17/22 0925   HYDROcodone-acetaminophen (NORCO/VICODIN) 5-325 MG per tablet 1-2 tablet  1-2 tablet Oral Q6H PRN Margie Ege A, DO   2 tablet at 01/17/22 0404   morphine (PF) 2 MG/ML injection 0.5 mg  0.5 mg Intravenous Q2H PRN Margie Ege A, DO   0.5 mg at 01/16/22 0235   multivitamin with minerals tablet 1 tablet  1 tablet Oral Daily Rolly Salter, MD   1 tablet at 01/17/22 4132      Discharge Medications: Please see discharge summary for a list of discharge medications.  Relevant Imaging Results:  Relevant Lab Results:   Additional Information [phone] [place], [place]

## 2022-01-17 NOTE — Progress Notes (Addendum)
At 1900 noted 200 mls on canister from dayshift, per latter urine was from 1330 post flomax. Patient instructed that I will have to bladder scan her as it has been more than 6 hours since her last urination. Patient understood.  At 2031, patient back on bed from Chair, bladder scan performed and noted >999 mls, patient stated she feels the need to pee, this nurse told the patient that I will be back at 2100 to check on her and if still unable to pee will do an in and out. Patient greed on plan.  At 2104, patient only able to pee 100 mls, patient now strongly refuses intermittent catheter stating she is afraid she will get infection from it, educated to no avail, informed Audrea Muscat, NP.  At 2119, per Audrea Muscat NP, will have to continue to monitor if patient will complain of discomfort,patient advised that if will have bladder pain, to call nurse and will do intermittent catheterization. Patient agreed.

## 2022-01-17 NOTE — Progress Notes (Signed)
°  Progress Note   Patient: Latoya Parrish PXT:[id number] DOB: 1941-05-01 DOA: 01/15/2022     Hospitalization day: 0 DOS: the patient was seen and examined on 01/17/2022   Brief hospital course: TURA ROLLER is a 81 y.o. female with medical history significant of PAF, HTN, ischemic cardiomyopathy, vertigo, MVP. Presenting with right hip pain after fall.  Assessment and Plan: Unwitnessed fall. Pelvic rami fracture. Pain is well controlled. Orthopedic recommends weightbearing as tolerated and nonoperative management. PT OT recommends SNF. Patient is not safe to go home given her history of gastric neuroma with ongoing vertigo and the fact that she is living alone.  Vertigo. History of acoustic neuroma. MRI brain with and without contrast shows ring-enhancing 1 cm x 1 cm lesion concerning for acoustic neuroma. Discussed with Dr. Jenne Pane.  Patient will follow-up with ENT outpatient. Most likely will require tertiary center referral.  PAF. HTN Cardiomyopathy HLD Mitral valve prolapse. On home regimen. Monitor.  Dilated left superior ophthalmic vein. History of epistaxis. We will place IR referral for outpatient follow-up.  Subjective: No nausea no vomiting.  Vertigo resolved.  Mild dizziness.  No chest pain no focal deficit.  Physical Exam: Vitals:   01/16/22 1623 01/16/22 2011 01/17/22 0327 01/17/22 1348  BP: (!) 157/78 (!) 152/75 (!) 156/73 (!) 151/68  Pulse: 91 (!) 107 92 96  Resp: 17 18 16  (!) 21  Temp: 97.9 F (36.6 C) 97.7 F (36.5 C) 99.4 F (37.4 C) 97.9 F (36.6 C)  TempSrc:   Oral Oral  SpO2: 97% 94% 95% 97%  Weight:      Height:       General: Appear in mild distress, no Rash; Oral Mucosa Clear, moist. no Abnormal Neck Mass Or lumps, Conjunctiva normal  Cardiovascular: S1 and S2 Present, no Murmur, Respiratory: good respiratory effort, Bilateral Air entry present and CTA, no Crackles, no wheezes Abdomen: Bowel Sound present, Soft and no  tenderness Extremities: no Pedal edema Neurology: alert and oriented to time, place, and person affect appropriate. no new focal deficit, chronic facial asymmetry. Gait not checked due to patient safety concerns   Data Reviewed: I have independently visualized and interpreted imaging MRI brain which showed 1 cm x 1 cm ring-enhancing lesion. I have discussed pt's care plan and test results with ENT Dr. .   Family Communication: Daughter at bedside.  Disposition: Status is: Observation  Author: Jenne Pane, MD 01/17/2022 8:43 PM  For on call review [email or web address].

## 2022-01-18 DIAGNOSIS — R339 Retention of urine, unspecified: Secondary | ICD-10-CM | POA: Diagnosis present

## 2022-01-18 DIAGNOSIS — W19XXXA Unspecified fall, initial encounter: Secondary | ICD-10-CM | POA: Diagnosis not present

## 2022-01-18 DIAGNOSIS — R338 Other retention of urine: Secondary | ICD-10-CM | POA: Diagnosis present

## 2022-01-18 DIAGNOSIS — R296 Repeated falls: Secondary | ICD-10-CM | POA: Diagnosis not present

## 2022-01-18 DIAGNOSIS — R9389 Abnormal findings on diagnostic imaging of other specified body structures: Secondary | ICD-10-CM | POA: Diagnosis present

## 2022-01-18 LAB — CBC
HCT: 27.7 % — ABNORMAL LOW (ref 36.0–46.0)
Hemoglobin: 8.9 g/dL — ABNORMAL LOW (ref 12.0–15.0)
MCH: 27.1 pg (ref 26.0–34.0)
MCHC: 32.1 g/dL (ref 30.0–36.0)
MCV: 84.2 fL (ref 80.0–100.0)
Platelets: 164 10*3/uL (ref 150–400)
RBC: 3.29 MIL/uL — ABNORMAL LOW (ref 3.87–5.11)
RDW: 14.6 % (ref 11.5–15.5)
WBC: 9.5 10*3/uL (ref 4.0–10.5)
nRBC: 0 % (ref 0.0–0.2)

## 2022-01-18 LAB — BASIC METABOLIC PANEL
Anion gap: 4 — ABNORMAL LOW (ref 5–15)
BUN: 13 mg/dL (ref 8–23)
CO2: 22 mmol/L (ref 22–32)
Calcium: 8 mg/dL — ABNORMAL LOW (ref 8.9–10.3)
Chloride: 106 mmol/L (ref 98–111)
Creatinine, Ser: 0.77 mg/dL (ref 0.44–1.00)
GFR, Estimated: >60 mL/min (ref >60)
Glucose, Bld: 106 mg/dL — ABNORMAL HIGH (ref 70–99)
Potassium: 3.7 mmol/L (ref 3.5–5.1)
Sodium: 132 mmol/L — ABNORMAL LOW (ref 135–145)

## 2022-01-18 MED ORDER — CHLORHEXIDINE GLUCONATE CLOTH 2 % EX PADS
6.0000 | MEDICATED_PAD | Freq: Every day | CUTANEOUS | Status: DC
  Administered 2022-01-18 – 2022-01-19 (×2): 6 via TOPICAL

## 2022-01-18 NOTE — Progress Notes (Signed)
°  Progress Note   Patient: Latoya Parrish [id number] DOB: 1941-05-04 DOA: 01/15/2022     Hospitalization day: 0 DOS: the patient was seen and examined on 01/18/2022   Brief hospital course: Latoya Parrish is a 81 y.o. female with medical history significant of PAF, HTN, ischemic cardiomyopathy, vertigo, MVP. Presenting with right hip pain after fall. Medically stable.  Currently awaiting placement.  Assessment and Plan: * Unwitnessed fall- (present on admission) Pelvic rami fracture. Pain is well controlled. Orthopedic recommends weightbearing as tolerated and nonoperative management. PT OT recommends SNF. Patient is not safe to go home given her history of gastric neuroma with ongoing vertigo and the fact that she is living alone.  Acute urinary retention- (present on admission) Likely multifactorial but patient is unable to urinate on her own. Patient tells me that she actually holds her urine for prolonged duration. Currently constipated.  Also pain playing a role. In and out catheterization so far x2.  Will place Foley catheter if she is still retaining.  Continue Flomax.  Vertigo History of acoustic neuroma. MRI brain with and without contrast shows ring-enhancing 1 cm x 1 cm lesion concerning for acoustic neuroma. Discussed with Dr. Redmond Baseman.  Patient will follow-up with ENT outpatient. Most likely will require tertiary center referral  Paroxysmal atrial fibrillation (Latoya Parrish)- (present on admission) HTN HLD Mitral valve prolapse.   Echocardiogram in November 2022 shows preserved EF with diastolic dysfunction and normal RV.  Aortic regurgitation which is moderate.  Also moderate pulmonic regurgitation.  Monitor. Follows up with cardiology.  Plan is for performing TEE sometime in outpatient area.  Dilated left superior ophthalmic vein. History of epistaxis. We will place IR referral for outpatient follow-up.   Subjective: No vomiting.  Having retention issues.  No fever no  chills.  Had an episode of incontinence with stool.  Physical Exam: Vitals:   01/17/22 1348 01/18/22 0323 01/18/22 0359 01/18/22 1413  BP: (!) 151/68 (!) 142/78 126/70 111/66  Pulse: 96 100 88 74  Resp: (!) 21 20 14 16   Temp: 97.9 F (36.6 C) 98.1 F (36.7 C) 98.8 F (37.1 C) 99 F (37.2 C)  TempSrc: Oral Oral Oral Oral  SpO2: 97% 96% 96% 96%  Weight:   76.4 kg   Height:       General: Appear in mild distress; no visible Abnormal Neck Mass Or lumps, Conjunctiva normal Cardiovascular: S1 and S2 Present, aortic systolic Murmur, Respiratory: good respiratory effort, Bilateral Air entry present and CTA, no Crackles, no wheezes Abdomen: Bowel Sound present Extremities: no Pedal edema Neurology: alert and oriented to time, place, and person Gait not checked due to patient safety concerns   Data Reviewed: I have reviewed nursing notes and labs and vitals.  Family Communication: None at bedside.  Disposition: Status is: Observation  Author: Berle Mull, MD 01/18/2022 6:35 PM  For on call review [email or web address].

## 2022-01-18 NOTE — Assessment & Plan Note (Addendum)
Likely multifactorial but patient is unable to urinate on her own. Patient tells me that she actually holds her urine for prolonged duration. Continue Flomax. Foley inserted on 01/18/2022. Continue both for 7 days Voiding trial on 01/25/2022.

## 2022-01-18 NOTE — Assessment & Plan Note (Addendum)
HTN HLD Mitral valve prolapse. Echocardiogram in November 2022 shows preserved EF with diastolic dysfunction and normal RV.  Aortic regurgitation which is moderate.  Also moderate pulmonic regurgitation.  Monitor. Follows up with cardiology.  Plan is for performing TEE sometime in future.

## 2022-01-18 NOTE — Progress Notes (Signed)
Occupational Therapy Treatment Patient Details Name: Latoya Parrish MRN: [id number] DOB: 1941/01/21 Today's Date: 01/18/2022   History of present illness 81 y.o. female with medical history significant of PAF, HTN, ischemic cardiomyopathy, vertigo, MVP, excision of acoustic neuroma (~30 years ago per pt). Presenting with right hip pain after fall. Dx: fractures of R inferior pubic ramus, sacral ala, and superior pubic root near acetabulum. Per ortho, Non operative management, WBAT.  2/22 MRI brain: "0.9 cm x 1.0 cm enhancing lesion in the left IAC consistent with  a vestibular schwannoma."   OT comments  Patient very pleasant and cooperative with therapy. With increased time patient self able to sit up to edge of bed with heavy use of bed rail and head of bed elevated. With cues for hand placement and bed height elevated patient needing mod A to power up to standing. With min A for stability patient able to take small steps over to recliner chair with increased time and min A for eccentric control to sit. Patient set up A for grooming/hygiene tasks seated in recliner, limited standing tolerance + balance at this time. Continue to recommend short term rehab at D/C.    Recommendations for follow up therapy are one component of a multi-disciplinary discharge planning process, led by the attending physician.  Recommendations may be updated based on patient status, additional functional criteria and insurance authorization.    Follow Up Recommendations  Skilled nursing-short term rehab (<3 hours/day)    Assistance Recommended at Discharge Frequent or constant Supervision/Assistance  Patient can return home with the following  A lot of help with bathing/dressing/bathroom;Assistance with cooking/housework;Help with stairs or ramp for entrance;A lot of help with walking and/or transfers   Equipment Recommendations  Other (comment) (defer next venue)       Precautions / Restrictions  Precautions Precautions: Fall Precaution Comments: left acoustic neuroma per 2/22 brain MRI; hx of acoustic neuroma and removal, left facial palsy Restrictions Weight Bearing Restrictions: No Other Position/Activity Restrictions: WBAT       Mobility Bed Mobility Overal bed mobility: Needs Assistance Bed Mobility: Supine to Sit     Supine to sit: Supervision, HOB elevated     General bed mobility comments: With increased time, heavy use of bed rail and head of bed elevated patient self able to sit up to edge of bed       Balance Overall balance assessment: Needs assistance Sitting-balance support: No upper extremity supported, Feet supported Sitting balance-Leahy Scale: Fair     Standing balance support: Reliant on assistive device for balance, During functional activity Standing balance-Leahy Scale: Poor                             ADL either performed or assessed with clinical judgement   ADL Overall ADL's : Needs assistance/impaired     Grooming: Oral care;Wash/dry face;Set up;Sitting Grooming Details (indicate cue type and reason): Completed in recliner chair, decreased standing tolerance to do at sink side                 Toilet Transfer: Moderate assistance;Cueing for safety;Stand-pivot;Rolling walker (2 wheels) Toilet Transfer Details (indicate cue type and reason): With bed height elevated and mod A to power up to standing using rolling walker. Verbal cues for hand placement to assist with pushing up to standing. Min A to take small steps over to recliner and min A for eccentric control into chair  Functional mobility during ADLs: Moderate assistance;Cueing for safety;Cueing for sequencing;Rolling walker (2 wheels)        Cognition Arousal/Alertness: Awake/alert Behavior During Therapy: WFL for tasks assessed/performed Overall Cognitive Status: Within Functional Limits for tasks assessed                                                      Pertinent Vitals/ Pain       Pain Assessment Pain Assessment: Faces Faces Pain Scale: Hurts little more Pain Location: R pelvis with weight bearing Pain Descriptors / Indicators: Discomfort Pain Intervention(s): Monitored during session         Frequency  Min 2X/week        Progress Toward Goals  OT Goals(current goals can now be found in the care plan section)  Progress towards OT goals: Progressing toward goals  Acute Rehab OT Goals Patient Stated Goal: Go to rehab to get better OT Goal Formulation: With patient Time For Goal Achievement: 01/30/22 Potential to Achieve Goals: Good ADL Goals Pt Will Transfer to Toilet: with min guard assist;ambulating;regular height toilet;grab bars Pt Will Perform Toileting - Clothing Manipulation and hygiene: with min guard assist;sit to/from stand Pt/caregiver will Perform Home Exercise Program: Increased strength;Both right and left upper extremity Additional ADL Goal #1: Patient will stand at sink to perform grooming task as evidence of improving activity tolerance  Plan Discharge plan remains appropriate       AM-PAC OT "6 Clicks" Daily Activity     Outcome Measure   Help from another person eating meals?: A Little Help from another person taking care of personal grooming?: A Little Help from another person toileting, which includes using toliet, bedpan, or urinal?: A Lot Help from another person bathing (including washing, rinsing, drying)?: A Lot Help from another person to put on and taking off regular upper body clothing?: A Little Help from another person to put on and taking off regular lower body clothing?: A Lot 6 Click Score: 15    End of Session Equipment Utilized During Treatment: Gait belt;Rolling walker (2 wheels)  OT Visit Diagnosis: Other abnormalities of gait and mobility (R26.89);Pain Pain - Right/Left: Right Pain - part of body: Hip   Activity Tolerance Patient tolerated  treatment well   Patient Left in chair;with call bell/phone within reach   Nurse Communication Mobility status        Time: [id number] OT Time Calculation (min): 24 min  Charges: OT General Charges $OT Visit: 1 Visit OT Treatments $Self Care/Home Management : 23-37 mins  Delbert Phenix OT OT pager: [phone]   Rosemary Holms 01/18/2022, 11:23 AM

## 2022-01-18 NOTE — Progress Notes (Signed)
At 0545, Charge nurse Tom, talked to patient about bladder distention, patient finally agreed on in and out.  At 0551 in and out performed  At 0610, 1575 ml total urine removed from patient, post bladder scan shows 62 mls. Peri care done, changed purewick.

## 2022-01-18 NOTE — Plan of Care (Signed)
°  Problem: Clinical Measurements: Goal: Will remain free from infection Outcome: Not Progressing   Problem: Activity: Goal: Risk for activity intolerance will decrease Outcome: Not Progressing   Problem: Elimination: Goal: Will not experience complications related to urinary retention Outcome: Not Progressing   Problem: Elimination: Goal: Will not experience complications related to bowel motility Outcome: Not Progressing   Problem: Safety: Goal: Ability to remain free from injury will improve Outcome: Not Progressing

## 2022-01-18 NOTE — Assessment & Plan Note (Signed)
History of epistaxis. We will place IR referral for outpatient follow-up.

## 2022-01-18 NOTE — Assessment & Plan Note (Signed)
Pelvic rami fracture. Pain is well controlled. Orthopedic recommends weightbearing as tolerated and nonoperative management. PT OT recommends SNF. Patient is not safe to go home given her history of gastric neuroma with ongoing vertigo and the fact that she is living alone.

## 2022-01-18 NOTE — Plan of Care (Signed)
  Problem: Activity: Goal: Risk for activity intolerance will decrease Outcome: Progressing   Problem: Nutrition: Goal: Adequate nutrition will be maintained Outcome: Progressing   Problem: Coping: Goal: Level of anxiety will decrease Outcome: Progressing   Problem: Safety: Goal: Ability to remain free from injury will improve Outcome: Progressing   

## 2022-01-18 NOTE — Assessment & Plan Note (Signed)
History of acoustic neuroma. MRI brain with and without contrast shows ring-enhancing 1 cm x 1 cm lesion concerning for acoustic neuroma. Discussed with Dr. Redmond Baseman.  Patient will follow-up with ENT outpatient. Most likely will require tertiary center referral

## 2022-01-18 NOTE — TOC Progression Note (Addendum)
Transition of Care [facility]) - Progression Note    Patient Details  Name: BRILEIGH SEVCIK MRN: [id number] Date of Birth: [birth year]  Transition [facility]) CM/SW Contact  Ida Rogue, [place] Phone Number: 01/18/2022, 11:31 AM  Clinical Narrative:   Have not been hearing back from [facility]-found out Florentina Addison has been out since yesterday. Left message for Burnetta Sabin. Spoke with patient and daughter who are open to other possibilities.  Daughter requested I FAX information to [facility] in [place]  Done. Have three other bed offers as well.  Explained that we need to make decision today as patient is medically stable.  Will request insurance auth once facility is selected. TOC will continue to follow during the course of hospitalization.  Addendum: Patient is accepted at [facility] SNF via [place].  Insurance auth request through [facility] initiated. Number to call report when auth received is [phone], room 40A, [place]. For TOC, first number to call is on-call RN Eric at [phone].     Expected Discharge Plan: Skilled Nursing Facility Barriers to Discharge: SNF Pending bed offer  Expected Discharge Plan and Services Expected Discharge Plan: Skilled Nursing Facility   Discharge Planning Services: CM Consult   Living arrangements for the past 2 months: Apartment                                       Social Determinants of Health (SDOH) Interventions    Readmission Risk Interventions No flowsheet data found.

## 2022-01-18 NOTE — Hospital Course (Addendum)
Latoya Parrish is a 81 y.o. female with medical history significant of PAF, HTN, ischemic cardiomyopathy, vertigo, MVP. Presenting with right hip pain after fall. Medically stable.

## 2022-01-18 NOTE — Progress Notes (Signed)
Bladder scan performed by NT Kobe, still >999 mls, patient still refusing intermittent catheterization, states she will try to pee by herself.

## 2022-01-19 DIAGNOSIS — R296 Repeated falls: Secondary | ICD-10-CM | POA: Diagnosis not present

## 2022-01-19 MED ORDER — ENSURE ENLIVE PO LIQD: 237.0000 mL | Freq: Two times a day (BID) | ORAL | 0 refills | Status: DC

## 2022-01-19 MED ORDER — DOCUSATE SODIUM 100 MG PO CAPS: 100.0000 mg | ORAL_CAPSULE | Freq: Two times a day (BID) | ORAL | 0 refills | Status: DC

## 2022-01-19 MED ORDER — HYDROCODONE-ACETAMINOPHEN 5-325 MG PO TABS: 1.0000 | ORAL_TABLET | Freq: Four times a day (QID) | ORAL | 0 refills | Status: DC | PRN

## 2022-01-19 MED ORDER — TAMSULOSIN HCL 0.4 MG PO CAPS: 0.4000 mg | ORAL_CAPSULE | Freq: Every day | ORAL | 0 refills | Status: AC

## 2022-01-19 MED ORDER — ADULT MULTIVITAMIN W/MINERALS CH: 1.0000 | ORAL_TABLET | Freq: Every day | ORAL | 0 refills | Status: AC

## 2022-01-19 NOTE — TOC Transition Note (Signed)
Transition of Care [facility]) - CM/SW Discharge Note   Patient Details  Name: Latoya Parrish MRN: [id number] Date of Birth: 05-Nov-1941  [facility]) CM/SW Contact:  Darleene Cleaver, LCSW Phone Number: 01/19/2022, 10:35 AM   Clinical Narrative:    CSW spoke to weekend on call RNMinerva Areola [phone].  Patient has been approved for SNF placement reference number [id number].  Patient to be d/c'ed today to [facility] room [place].  Patient and family agreeable to plans will transport via ems RN to call report to [phone].    Final next level of care: Skilled Nursing Facility Barriers to Discharge: Barriers Resolved   Patient Goals and CMS Choice Patient states their goals for this hospitalization and ongoing recovery are:: To go to SNF for short term rehab, then return back home. CMS Medicare.gov Compare Post Acute Care list provided to:: Patient Represenative (must comment) Choice offered to / list presented to : Adult Children  Discharge Placement PASRR number recieved: 01/17/22            Patient chooses bed at: [facility] Patient to be transferred to facility by: [facility] Name of family member notified: Daughter Jeanice Lim [phone] Patient and family notified of of transfer: 01/19/22  Discharge Plan and Services   Discharge Planning Services: CM Consult                                 Social Determinants of Health (SDOH) Interventions     Readmission Risk Interventions No flowsheet data found.

## 2022-01-19 NOTE — Progress Notes (Signed)
Pt has been discharged. Daughter aware. Awaiting transport by EMS to [facility].

## 2022-01-19 NOTE — Plan of Care (Signed)
Pt d/c to [facility] SNF. Pt alert and oriented. Family aware of transfer.

## 2022-01-19 NOTE — Progress Notes (Signed)
Staff from [facility] has returned writer's call. Report given.

## 2022-01-19 NOTE — Assessment & Plan Note (Signed)
Blood pressure stable. °Continue home regimen. °

## 2022-01-19 NOTE — Progress Notes (Signed)
Writer attempted to call report to [facility] "[facility]", without answer. Left number for call back for report. EMS has been called per SW. Family aware. Just left with pt belongings. Pt has glasses, cell phone, and computer at bedside.

## 2022-01-19 NOTE — Discharge Summary (Signed)
Physician Discharge Summary   Patient: Latoya Parrish MRN: [id number] DOB: [birth year]  Admit date:     01/15/2022  Discharge date: 01/19/22  Discharge Physician: Berle Mull   PCP: Michael Boston, MD   Recommendations at discharge:    Follow up with ENT and Neuro IR as recommended Recommendation is for voiding trial with foley removal on 01/25/2022  Discharge Diagnoses: Principal Problem:   Unwitnessed fall Active Problems:   Acute urinary retention   Vertigo   Paroxysmal atrial fibrillation (HCC)   Dilated left superior ophthalmic vein.   HYPERCHOLESTEROLEMIA   Essential hypertension   Mitral valve disorder  Resolved Problems:   * No resolved hospital problems. Latoya Parrish: Latoya Parrish is a 81 y.o. female with medical history significant of PAF, HTN, ischemic cardiomyopathy, vertigo, MVP. Presenting with right hip pain after fall. Medically stable.   Assessment and Plan: * Unwitnessed fall- (present on admission) Pelvic rami fracture. Pain is well controlled. Orthopedic recommends weightbearing as tolerated and nonoperative management. PT OT recommends SNF. Patient is not safe to go home given her history of gastric neuroma with ongoing vertigo and the fact that she is living alone.  Acute urinary retention- (present on admission) Likely multifactorial but patient is unable to urinate on her own. Patient tells me that she actually holds her urine for prolonged duration. Continue Flomax. Foley inserted on 01/18/2022. Continue both for 7 days Voiding trial on 01/25/2022.  Vertigo History of acoustic neuroma. MRI brain with and without contrast shows ring-enhancing 1 cm x 1 cm lesion concerning for acoustic neuroma. Discussed with Dr. Redmond Baseman.  Patient will follow-up with ENT outpatient. Most likely will require tertiary center referral  Paroxysmal atrial fibrillation ([facility])- (present on admission) HTN HLD Mitral valve prolapse. Echocardiogram in November  2022 shows preserved EF with diastolic dysfunction and normal RV.  Aortic regurgitation which is moderate.  Also moderate pulmonic regurgitation.  Monitor. Follows up with cardiology.  Plan is for performing TEE sometime in future.  Dilated left superior ophthalmic vein.- (present on admission) History of epistaxis. We will place IR referral for outpatient follow-up.  Essential hypertension- (present on admission) Blood pressure stable. Continue home regimen   Pain control - [place] Controlled Substance Reporting System database was reviewed. and patient was instructed, not to drive, operate heavy machinery, perform activities at heights, swimming or participation in water activities or provide baby-sitting services while on Pain, Sleep and Anxiety Medications; until their outpatient Physician has advised to do so again. Also recommended to not to take more than prescribed Pain, Sleep and Anxiety Medications.   Consultants: Orthopedics  Procedures performed: none  Disposition: Skilled nursing facility Diet recommendation:  Discharge Diet Orders (From admission, onward)     Start     Ordered   01/19/22 0000  Diet - low sodium heart healthy        01/19/22 0905           Regular diet  DISCHARGE MEDICATION: Allergies as of 01/19/2022       Reactions   Fosamax [alendronate Sodium]    Dental problems   Statins    Muscle pain    Ace Inhibitors Cough        Medication List     STOP taking these medications    losartan 50 MG tablet Commonly known as: COZAAR       TAKE these medications    apixaban 5 MG Tabs tablet Commonly known as: Eliquis Take 1 tablet (  5 mg total) by mouth 2 (two) times daily.   calcium-vitamin D 500-200 MG-UNIT tablet Take 1 tablet by mouth daily.   carvedilol 6.25 MG tablet Commonly known as: COREG Take 1 tablet (6.25 mg total) by mouth 2 (two) times daily.   diltiazem 30 MG tablet Commonly known as: Cardizem Take 1 tablet every  4 hours AS NEEDED for AFIB heart rate >100   diphenhydrAMINE 25 MG tablet Commonly known as: BENADRYL Take 25 mg by mouth at bedtime as needed for allergies.   docusate sodium 100 MG capsule Commonly known as: COLACE Take 1 capsule (100 mg total) by mouth 2 (two) times daily.   feeding supplement Liqd Take 237 mLs by mouth 2 (two) times daily between meals.   furosemide 20 MG tablet Commonly known as: LASIX Take 1 tablet (20 mg total) by mouth as needed. What changed:  when to take this reasons to take this   HYDROcodone-acetaminophen 5-325 MG tablet Commonly known as: NORCO/VICODIN Take 1-2 tablets by mouth every 6 (six) hours as needed for moderate pain or severe pain.   Lubricant Eye Nighttime Oint Apply 1 application to eye in the morning and at bedtime.   meclizine 25 MG tablet Commonly known as: ANTIVERT Take 1 tablet (25 mg total) by mouth 3 (three) times daily as needed for dizziness.   multivitamin with minerals Tabs tablet Take 1 tablet by mouth daily.   ondansetron 4 MG disintegrating tablet Commonly known as: ZOFRAN-ODT Take 1 tablet (4 mg total) by mouth every 8 (eight) hours as needed for nausea or vomiting.   tamsulosin 0.4 MG Caps capsule Commonly known as: FLOMAX Take 1 capsule (0.4 mg total) by mouth daily for 7 days.   vitamin B-12 100 MCG tablet Commonly known as: CYANOCOBALAMIN Take 100 mcg by mouth daily.        Follow-up Information     [facility]   Specialty: Orthopedic Surgery Why: 4 weeks - recheck Contact information: [facility] [place] [address] [phone]         Michael Boston, MD. Schedule an appointment as soon as possible for a visit in 1 week(s).   Specialty: Internal Medicine Contact information: [place] [address] [phone]         Melida Quitter, MD. Go on 01/30/2022.   Specialty: Otolaryngology Contact  information: [address] [place] [address] [phone]                 Discharge Exam: Latoya Parrish Weights   01/16/22 0500 01/18/22 0359 01/19/22 0703  Weight: 69 kg 76.4 kg 70.1 kg   General: Appear in mild distress, no Rash; Oral Mucosa Clear, moist. no Abnormal Neck Mass Or lumps, Conjunctiva normal  Cardiovascular: S1 and S2 Present, no Murmur, Respiratory: good respiratory effort, Bilateral Air entry present and CTA, no Crackles, no wheezes Abdomen: Bowel Sound present, Soft and no tenderness Extremities: no Pedal edema Neurology: alert and oriented to time, place, and person affect appropriate. no new focal deficit, post surgical facial asymmetry  Gait not checked due to patient safety concerns    Condition at discharge: good  The results of significant diagnostics from this hospitalization (including imaging, microbiology, ancillary and laboratory) are listed below for reference.   Imaging Studies: DG Chest 1 View  Result Date: 01/15/2022 CLINICAL DATA:  An 81 year old female presents following fall. EXAM: CHEST  1 VIEW COMPARISON:  July 15, 2019 FINDINGS: Image rotated to the LEFT. Accounting for this cardiomediastinal contours and hilar structures are stable. Lungs are clear. No visible pneumothorax. On limited assessment there is no visible acute bony abnormality. IMPRESSION: No acute cardiopulmonary disease. Electronically Signed   By: Zetta Bills M.D.   On: 01/15/2022 10:02   MR BRAIN W WO CONTRAST  Result Date: 01/16/2022 CLINICAL DATA:  Neuro deficit, stroke suspected. Dizziness. History of acoustic neuroma removal in 1991 EXAM: MRI HEAD WITHOUT AND WITH CONTRAST TECHNIQUE: Multiplanar, multiecho pulse sequences of the brain and surrounding structures were obtained without and with intravenous contrast. CONTRAST:  14mL GADAVIST GADOBUTROL 1 MMOL/ML IV SOLN COMPARISON:  None. FINDINGS: Brain: There is no evidence of acute intracranial  hemorrhage, extra-axial fluid collection, or acute infarct. Parenchymal volume is normal. The ventricles are normal in size. There is a minimal burden of chronic white matter microangiopathic change. There is no abnormal parenchymal enhancement. Postsurgical changes reflecting left suboccipital craniectomy are seen, likely related to the reported history of vestibular schwannoma removal. There is a 0.9 cm by 1.0 cm enhancing lesion in the left IAC. There is no other mass lesion. There is no mass effect or midline shift. Vascular: Normal flow voids. Skull and upper cervical spine: Normal marrow signal. Sinuses/Orbits: The paranasal sinuses are clear. The globes are unremarkable. There is a tubular enhancing structure along the roof of the left orbit measuring 2.2 cm x 1.1 cm which appears contiguous with the superior ophthalmic vein anteriorly. There is no proptosis or bulging or asymmetric enhancement of the left cavernous sinus. Other: None. IMPRESSION: 1. 0.9 cm x 1.0 cm enhancing lesion in the left IAC consistent with a vestibular schwannoma. 2. Dilated left superior ophthalmic vein may reflect a venous varix. A carotid cavernous fistula could have a similar appearance, but there is no proptosis or asymmetry of the cavernous sinuses, making this diagnosis less likely. Correlate with any left orbital symptoms. Electronically Signed   By: Valetta Mole M.D.   On: 01/16/2022 15:12   CT Hip Right Wo Contrast  Result Date: 01/15/2022 CLINICAL DATA:  Hip trauma, fracture suspected, xray done EXAM: CT OF THE RIGHT HIP WITHOUT CONTRAST TECHNIQUE: Multidetector CT imaging of the right hip was performed according to the standard protocol. Multiplanar CT image reconstructions were also generated. RADIATION DOSE REDUCTION: This exam was performed according to the departmental dose-optimization program which includes automated exposure control, adjustment of the mA and/or kV according to patient size and/or use of  iterative reconstruction technique. COMPARISON:  Same day radiograph and 05/27/2012 FINDINGS: Bones/Joint/Cartilage There is a minimally displaced right inferior pubic ramus fracture. There is a nondisplaced right sacral ala fracture in zone 1 which extends inferiorly in close proximity to the anterior aspect of the SI joint. Additionally, there is a nondisplaced fracture at the superior pubic root near the acetabulum (coronal image 31, axial image 91). There is no evidence of a femoral neck fracture. There is severe right hip osteoarthritis with bulky osteophyte formation, subchondral sclerosis and cystic change, which has by markedly progressed since 2013. Ligaments Suboptimally assessed by CT. Muscles and Tendons There is severe gluteus minimus muscle atrophy. No intramuscular collection. Soft tissues Soft tissue swelling adjacent to the fractures. Sigmoid diverticulosis. IMPRESSION: Minimally displaced right inferior pubic ramus fracture. Nondisplaced right sacral ala fracture in zone 1 extending inferiorly in close proximity to the anterior aspect of the sacroiliac joint. Nondisplaced fracture at the superior pubic root near the acetabulum. No evidence of femoral neck fracture. Severe  right hip osteoarthritis, markedly progressed since 2013. Electronically Signed   By: Maurine Simmering M.D.   On: 01/15/2022 12:11   DG Hip Unilat With Pelvis 2-3 Views Right  Result Date: 01/15/2022 CLINICAL DATA:  W a female age 81 presents following fall with hip injury, fall to RIGHT hip with pain on movement. EXAM: DG HIP (WITH OR WITHOUT PELVIS) 2-3V RIGHT COMPARISON:  Comparison made with May 27, 2012. FINDINGS: Marked interval worsening of degenerative change about the RIGHT hip since previous imaging, bone on bone contact is noted. Osteopenia limits assessment. No visible fracture. Lateral projection limited by cross-table projection. No signs of fracture of the bony pelvis. IMPRESSION: Marked interval worsening of  degenerative change about the RIGHT hip. No visible fracture. Limited assessment due to osteopenia and cross-table projection. If there is high clinical suspicion for fracture CT may be helpful for further evaluation. Electronically Signed   By: Zetta Bills M.D.   On: 01/15/2022 10:00    Microbiology: Results for orders placed or performed during the hospital encounter of 01/15/22  Resp Panel by RT-PCR (Flu A&B, Covid) Nasopharyngeal Swab     Status: None   Collection Time: 01/15/22  3:21 PM   Specimen: Nasopharyngeal Swab; Nasopharyngeal(NP) swabs in vial transport medium  Result Value Ref Range Status   SARS Coronavirus 2 by RT PCR NEGATIVE NEGATIVE Final    Comment: (NOTE) SARS-CoV-2 target nucleic acids are NOT DETECTED.  The SARS-CoV-2 RNA is generally detectable in upper respiratory specimens during the acute phase of infection. The lowest concentration of SARS-CoV-2 viral copies this assay can detect is 138 copies/mL. A negative result does not preclude SARS-Cov-2 infection and should not be used as the sole basis for treatment or other patient management decisions. A negative result may occur with  improper specimen collection/handling, submission of specimen other than nasopharyngeal swab, presence of viral mutation(s) within the areas targeted by this assay, and inadequate number of viral copies(<138 copies/mL). A negative result must be combined with clinical observations, patient history, and epidemiological information. The expected result is Negative.  Fact Sheet for Patients:  EntrepreneurPulse.com.au  Fact Sheet for Healthcare Providers:  IncredibleEmployment.be  This test is no t yet approved or cleared by the [place] FDA and  has been authorized for detection and/or diagnosis of SARS-CoV-2 by FDA under an Emergency Use Authorization (EUA). This EUA will remain  in effect (meaning this test can be used) for the duration of  the COVID-19 declaration under Section 564(b)(1) of the Act, 21 U.S.C.section 360bbb-3(b)(1), unless the authorization is terminated  or revoked sooner.       Influenza A by PCR NEGATIVE NEGATIVE Final   Influenza B by PCR NEGATIVE NEGATIVE Final    Comment: (NOTE) The Xpert Xpress SARS-CoV-2/FLU/RSV plus assay is intended as an aid in the diagnosis of influenza from Nasopharyngeal swab specimens and should not be used as a sole basis for treatment. Nasal washings and aspirates are unacceptable for Xpert Xpress SARS-CoV-2/FLU/RSV testing.  Fact Sheet for Patients: EntrepreneurPulse.com.au  Fact Sheet for Healthcare Providers: IncredibleEmployment.be  This test is not yet approved or cleared by the [place] FDA and has been authorized for detection and/or diagnosis of SARS-CoV-2 by FDA under an Emergency Use Authorization (EUA). This EUA will remain in effect (meaning this test can be used) for the duration of the COVID-19 declaration under Section 564(b)(1) of the Act, 21 U.S.C. section 360bbb-3(b)(1), unless the authorization is terminated or revoked.  Performed at [facility],  [place] [address] [place], [place] [address]     Labs: CBC: Recent Labs  Lab 01/15/22 0914 01/15/22 1951 01/16/22 0515 01/18/22 0540  WBC 9.3 8.1 7.2 9.5  NEUTROABS 6.7  --   --   --   HGB 10.3* 9.6* 9.6* 8.9*  HCT 32.0* 30.8* 30.5* 27.7*  MCV 84.9 85.3 85.9 84.2  PLT 190 171 169 [id number]   Basic Metabolic Panel: Recent Labs  Lab 01/15/22 0914 01/15/22 1951 01/16/22 0515 01/18/22 0540  NA 136  --  135 132*  K 3.9  --  4.2 3.7  CL 107  --  108 106  CO2 24  --  23 22  GLUCOSE 102*  --  103* 106*  BUN 13  --  14 13  CREATININE 0.97 0.96 0.79 0.77  CALCIUM 8.4*  --  7.9* 8.0*   Liver Function Tests: No results for input(s): AST, ALT, ALKPHOS, BILITOT, PROT, ALBUMIN in the last 168 hours. CBG: Recent Labs  Lab 01/15/22 1830   GLUCAP 111*    Discharge time spent: greater than 30 minutes.  Signed: Berle Mull, MD [facility] 01/19/2022

## 2022-01-19 NOTE — Plan of Care (Signed)
°  Problem: Clinical Measurements: Goal: Will remain free from infection Outcome: Not Progressing   Problem: Elimination: Goal: Will not experience complications related to urinary retention Outcome: Not Progressing   Problem: Safety: Goal: Ability to remain free from injury will improve Outcome: Not Progressing   

## 2022-01-19 NOTE — Progress Notes (Signed)
Daughter of pt aware of transfer to [facility].

## 2022-01-19 NOTE — Progress Notes (Signed)
Writer tried to call report to [facility], "[place]", as pt is going to Room 40A. Second attempt. Left number for call back. EMS is here for transport. Pt is alert and oriented. Ready to go to [facility]. Family aware.

## 2022-01-21 ENCOUNTER — Non-Acute Institutional Stay (SKILLED_NURSING_FACILITY): Payer: Medicare Other | Admitting: Nurse Practitioner

## 2022-01-21 ENCOUNTER — Encounter: Payer: Self-pay | Admitting: Nurse Practitioner

## 2022-01-21 DIAGNOSIS — E7800 Pure hypercholesterolemia, unspecified: Secondary | ICD-10-CM | POA: Diagnosis not present

## 2022-01-21 DIAGNOSIS — I059 Rheumatic mitral valve disease, unspecified: Secondary | ICD-10-CM

## 2022-01-21 DIAGNOSIS — S32691D Other specified fracture of right ischium, subsequent encounter for fracture with routine healing: Secondary | ICD-10-CM | POA: Diagnosis not present

## 2022-01-21 DIAGNOSIS — I10 Essential (primary) hypertension: Secondary | ICD-10-CM

## 2022-01-21 DIAGNOSIS — R42 Dizziness and giddiness: Secondary | ICD-10-CM

## 2022-01-21 DIAGNOSIS — R339 Retention of urine, unspecified: Secondary | ICD-10-CM

## 2022-01-21 DIAGNOSIS — Z86018 Personal history of other benign neoplasm: Secondary | ICD-10-CM

## 2022-01-21 DIAGNOSIS — K5901 Slow transit constipation: Secondary | ICD-10-CM

## 2022-01-21 DIAGNOSIS — I480 Paroxysmal atrial fibrillation: Secondary | ICD-10-CM

## 2022-01-21 DIAGNOSIS — E871 Hypo-osmolality and hyponatremia: Secondary | ICD-10-CM

## 2022-01-21 DIAGNOSIS — Z9889 Other specified postprocedural states: Secondary | ICD-10-CM

## 2022-01-21 DIAGNOSIS — D649 Anemia, unspecified: Secondary | ICD-10-CM

## 2022-01-21 NOTE — Progress Notes (Signed)
Location:   SNF [place] Room Number: 40 Place of Service:  SNF (31) Provider: [facility] Isidora Laham NP  Michael Boston, MD  Patient Care Team: Michael Boston, MD as PCP - General (Internal Medicine) Sueanne Margarita, MD as PCP - Cardiology (Cardiology)  Extended Emergency Contact Information Primary Emergency Contact: Roselie Skinner [place] Phone: [phone] Relation: Daughter  Code Status: DNR Goals of care: Advanced Directive information Advanced Directives 01/22/2022  Does Patient Have a Medical Advance Directive? Yes  Type of Advance Directive [place]  Does patient want to make changes to medical advance directive? No - Patient declined  Copy of [place] in Chart? -     Chief Complaint  Patient presents with   Acute Visit    Medication review    HPI:  Pt is a 81 y.o. female seen today for an acute visit for medication review following hospital stay  Hospitalized 01/15/22-01/19/22 for fall, vertigo, pelvic rami fx, WBAT, prn Norco available, f/u Ortho,  f/u ENT. Voiding trial with foley removal 01/25/22 for urinary retention, currently on Flomax.   Hx of Afib, on Eliquis, Carvedilol, Diltiazem  Hyperlipidemia, diet control.   HTN, takes Carvedilol, prn Furosemide  Mitral valve disorder, f/u cardiology.   Ischemic cardiomyopathy, f/u cardiology  Acoustic neuroma, MRI brain with and without contrast shows ring-enhancing 1 cm x 1 cm lesion concerning for acoustic neuroma. Discussed with Dr. Redmond Baseman.  Patient will follow-up with ENT outpatient, Neurology.   Vertigo: prn Meclizine, f/u neurology, ENT,   Hyponatremia, Na 132 01/18/22  Anemia, Hgb 8.9 01/18/22  Constipation, takes Colace       Past Medical History:  Diagnosis Date   Anemia    Aortic insufficiency    Mild by echo 09/2020   Aortic stenosis    mild by echo 09/2019 but not present on echo 09/2020   Arthritis    Chicken pox    Heart attack ([place])  2006   nontransmural MI seconday to vasispasm w normal coronary arteries   High cholesterol    Ischemic dilated cardiomyopathy ([place])    resolved   Mitral valve prolapse    with mild MR by echo 10/2015   White coat hypertension    Past Surgical History:  Procedure Laterality Date   acoustic neuroma   [place]  2009   CHOLECYSTECTOMY  1990   facial plastic surgery  [place]    Allergies  Allergen Reactions   Fosamax [Alendronate Sodium]     Dental problems   Statins     Muscle pain    Ace Inhibitors Cough    Allergies as of 01/21/2022       Reactions   Fosamax [alendronate Sodium]    Dental problems   Statins    Muscle pain    Ace Inhibitors Cough        Medication List        Accurate as of January 21, 2022 11:59 PM. If you have any questions, ask your nurse or doctor.          apixaban 5 MG Tabs tablet Commonly known as: Eliquis Take 1 tablet (5 mg total) by mouth 2 (two) times daily.   calcium-vitamin D 500-200 MG-UNIT tablet Take 1 tablet by mouth daily.   carvedilol 6.25 MG tablet Commonly known as: COREG Take 1 tablet (6.25 mg total) by mouth 2 (two) times daily.  diltiazem 30 MG tablet Commonly known as: Cardizem Take 1 tablet every 4 hours AS NEEDED for AFIB heart rate >100   diphenhydrAMINE 25 MG tablet Commonly known as: BENADRYL Take 25 mg by mouth at bedtime as needed for allergies.   docusate sodium 100 MG capsule Commonly known as: COLACE Take 1 capsule (100 mg total) by mouth 2 (two) times daily.   furosemide 20 MG tablet Commonly known as: LASIX Take 1 tablet (20 mg total) by mouth as needed. What changed:  when to take this reasons to take this   HYDROcodone-acetaminophen 5-325 MG tablet Commonly known as: NORCO/VICODIN Take 1-2 tablets by mouth every 6 (six) hours as needed for moderate pain or severe pain.   lactose free nutrition Liqd Take 237 mLs by mouth 2 (two)  times daily between meals. What changed: Another medication with the same name was removed. Continue taking this medication, and follow the directions you see here. Changed by: Taqwa Deem X Sylar Voong, NP   Lubricant Eye Nighttime Oint Apply 1 application to eye in the morning and at bedtime.   meclizine 25 MG tablet Commonly known as: ANTIVERT Take 1 tablet (25 mg total) by mouth 3 (three) times daily as needed for dizziness.   multivitamin with minerals Tabs tablet Take 1 tablet by mouth daily.   ondansetron 4 MG disintegrating tablet Commonly known as: ZOFRAN-ODT Take 1 tablet (4 mg total) by mouth every 8 (eight) hours as needed for nausea or vomiting.   tamsulosin 0.4 MG Caps capsule Commonly known as: FLOMAX Take 1 capsule (0.4 mg total) by mouth daily for 7 days.   vitamin B-12 100 MCG tablet Commonly known as: CYANOCOBALAMIN Take 100 mcg by mouth daily.        Review of Systems  Constitutional:  Negative for activity change, appetite change and fever.  HENT:  Negative for congestion and trouble swallowing.   Respiratory:  Negative for cough, shortness of breath and wheezing.   Cardiovascular:  Negative for chest pain, palpitations and leg swelling.  Gastrointestinal:  Negative for abdominal distention, abdominal pain, constipation, diarrhea and vomiting.  Genitourinary:  Positive for difficulty urinating.       Foley catheter.   Musculoskeletal:  Positive for arthralgias and gait problem.       Right hip/thigh pain with weight bearing or certain way the right leg movement.   Skin:  Negative for color change.  Neurological:  Positive for dizziness, facial asymmetry and weakness. Negative for speech difficulty.  Psychiatric/Behavioral:  Negative for confusion and sleep disturbance. The patient is not nervous/anxious.    Immunization History  Administered Date(s) Administered   Fluad Quad(high Dose 65+) 08/31/2019   Influenza Split 08/28/2011, 09/04/2012   Influenza Whole  08/31/2004, 09/11/2007, 08/23/2008, 09/05/2009, 08/21/2010   Influenza, High Dose Seasonal PF 09/26/2018, 09/26/2018, 09/19/2020   Influenza, Quadrivalent, Recombinant, Inj, Pf 09/04/2021   Influenza,inj,Quad PF,6+ Mos 09/02/2013, 09/22/2014, 09/11/2015, 08/30/2016, 09/24/2017   PFIZER(Purple Top)SARS-COV-2 Vaccination 08/16/2020, 10/13/2020, 04/10/2021   Pneumococcal Conjugate-13 01/12/2016   Pneumococcal Polysaccharide-23 06/29/2012, 01/10/2022   Td 11/25/1989, 10/11/2009   Pertinent  Health Maintenance Due  Topic Date Due   MAMMOGRAM  01/07/2019   INFLUENZA VACCINE  Completed   DEXA SCAN  Completed   Fall Risk 01/16/2022 01/17/2022 01/17/2022 01/18/2022 01/18/2022  Falls in the past year? - - - - -  Patient Fall Risk Level High fall risk High fall risk High fall risk High fall risk High fall risk  Patient at Risk for Falls Due to - - - - -  Functional Status Survey:    Vitals:   01/21/22 1632  BP: 122/83  Pulse: 83  Resp: 17  Temp: 97.8 F (36.6 C)  SpO2: 95%  Weight: 156 lb 9.6 oz (71 kg)  Height: 5' 8"  (1.727 m)   Body mass index is 23.81 kg/m. Physical Exam Vitals and nursing note reviewed.  Constitutional:      Appearance: Normal appearance.  HENT:     Head: Normocephalic and atraumatic.     Nose: Nose normal.     Mouth/Throat:     Mouth: Mucous membranes are moist.  Eyes:     Extraocular Movements: Extraocular movements intact.     Conjunctiva/sclera: Conjunctivae normal.     Pupils: Pupils are equal, round, and reactive to light.  Cardiovascular:     Rate and Rhythm: Normal rate and regular rhythm.     Heart sounds: Murmur heard.  Pulmonary:     Effort: Pulmonary effort is normal.     Breath sounds: No rales.  Abdominal:     General: Bowel sounds are normal.     Palpations: Abdomen is soft.     Tenderness: There is no abdominal tenderness.  Musculoskeletal:        General: Tenderness present.     Cervical back: Normal range of motion and neck supple.      Right lower leg: No edema.     Left lower leg: No edema.     Comments: Right hip/thigh region pain with weight bearing and movement.   Skin:    General: Skin is warm and dry.  Neurological:     Mental Status: She is alert and oriented to person, place, and time. Mental status is at baseline.     Cranial Nerves: Cranial nerve deficit present.     Motor: Weakness present.     Gait: Gait abnormal.     Comments: Left facial weakness from acoustic neuroma removal about 30 years ago.   Psychiatric:        Mood and Affect: Mood normal.        Behavior: Behavior normal.        Thought Content: Thought content normal.        Judgment: Judgment normal.    Labs reviewed: Recent Labs    01/15/22 0914 01/15/22 1951 01/16/22 0515 01/18/22 0540  NA 136  --  135 132*  K 3.9  --  4.2 3.7  CL 107  --  108 106  CO2 24  --  23 22  GLUCOSE 102*  --  103* 106*  BUN 13  --  14 13  CREATININE 0.97 0.96 0.79 0.77  CALCIUM 8.4*  --  7.9* 8.0*   No results for input(s): AST, ALT, ALKPHOS, BILITOT, PROT, ALBUMIN in the last 8760 hours. Recent Labs    01/15/22 0914 01/15/22 1951 01/16/22 0515 01/18/22 0540  WBC 9.3 8.1 7.2 9.5  NEUTROABS 6.7  --   --   --   HGB 10.3* 9.6* 9.6* 8.9*  HCT 32.0* 30.8* 30.5* 27.7*  MCV 84.9 85.3 85.9 84.2  PLT 190 171 169 164   Lab Results  Component Value Date   TSH 1.497 07/26/2019   No results found for: HGBA1C Lab Results  Component Value Date   CHOL 231 (H) 01/25/2019   HDL 48.50 01/25/2019   LDLCALC 121 (H) 01/04/2016   LDLDIRECT 145.0 01/25/2019   TRIG 259.0 (H) 01/25/2019   CHOLHDL 5 01/25/2019    Significant Diagnostic Results in last 30  days:  DG Chest 1 View  Result Date: 01/15/2022 CLINICAL DATA:  An 81 year old female presents following fall. EXAM: CHEST  1 VIEW COMPARISON:  July 15, 2019 FINDINGS: Image rotated to the LEFT. Accounting for this cardiomediastinal contours and hilar structures are stable. Lungs are clear. No visible  pneumothorax. On limited assessment there is no visible acute bony abnormality. IMPRESSION: No acute cardiopulmonary disease. Electronically Signed   By: Zetta Bills M.D.   On: 01/15/2022 10:02   MR BRAIN W WO CONTRAST  Result Date: 01/16/2022 CLINICAL DATA:  Neuro deficit, stroke suspected. Dizziness. History of acoustic neuroma removal in 1991 EXAM: MRI HEAD WITHOUT AND WITH CONTRAST TECHNIQUE: Multiplanar, multiecho pulse sequences of the brain and surrounding structures were obtained without and with intravenous contrast. CONTRAST:  87m GADAVIST GADOBUTROL 1 MMOL/ML IV SOLN COMPARISON:  None. FINDINGS: Brain: There is no evidence of acute intracranial hemorrhage, extra-axial fluid collection, or acute infarct. Parenchymal volume is normal. The ventricles are normal in size. There is a minimal burden of chronic white matter microangiopathic change. There is no abnormal parenchymal enhancement. Postsurgical changes reflecting left suboccipital craniectomy are seen, likely related to the reported history of vestibular schwannoma removal. There is a 0.9 cm by 1.0 cm enhancing lesion in the left IAC. There is no other mass lesion. There is no mass effect or midline shift. Vascular: Normal flow voids. Skull and upper cervical spine: Normal marrow signal. Sinuses/Orbits: The paranasal sinuses are clear. The globes are unremarkable. There is a tubular enhancing structure along the roof of the left orbit measuring 2.2 cm x 1.1 cm which appears contiguous with the superior ophthalmic vein anteriorly. There is no proptosis or bulging or asymmetric enhancement of the left cavernous sinus. Other: None. IMPRESSION: 1. 0.9 cm x 1.0 cm enhancing lesion in the left IAC consistent with a vestibular schwannoma. 2. Dilated left superior ophthalmic vein may reflect a venous varix. A carotid cavernous fistula could have a similar appearance, but there is no proptosis or asymmetry of the cavernous sinuses, making this diagnosis  less likely. Correlate with any left orbital symptoms. Electronically Signed   By: PValetta MoleM.D.   On: 01/16/2022 15:12   CT Hip Right Wo Contrast  Result Date: 01/15/2022 CLINICAL DATA:  Hip trauma, fracture suspected, xray done EXAM: CT OF THE RIGHT HIP WITHOUT CONTRAST TECHNIQUE: Multidetector CT imaging of the right hip was performed according to the standard protocol. Multiplanar CT image reconstructions were also generated. RADIATION DOSE REDUCTION: This exam was performed according to the departmental dose-optimization program which includes automated exposure control, adjustment of the mA and/or kV according to patient size and/or use of iterative reconstruction technique. COMPARISON:  Same day radiograph and 05/27/2012 FINDINGS: Bones/Joint/Cartilage There is a minimally displaced right inferior pubic ramus fracture. There is a nondisplaced right sacral ala fracture in zone 1 which extends inferiorly in close proximity to the anterior aspect of the SI joint. Additionally, there is a nondisplaced fracture at the superior pubic root near the acetabulum (coronal image 31, axial image 91). There is no evidence of a femoral neck fracture. There is severe right hip osteoarthritis with bulky osteophyte formation, subchondral sclerosis and cystic change, which has by markedly progressed since 2013. Ligaments Suboptimally assessed by CT. Muscles and Tendons There is severe gluteus minimus muscle atrophy. No intramuscular collection. Soft tissues Soft tissue swelling adjacent to the fractures. Sigmoid diverticulosis. IMPRESSION: Minimally displaced right inferior pubic ramus fracture. Nondisplaced right sacral ala fracture in zone 1 extending inferiorly  in close proximity to the anterior aspect of the sacroiliac joint. Nondisplaced fracture at the superior pubic root near the acetabulum. No evidence of femoral neck fracture. Severe right hip osteoarthritis, markedly progressed since 2013. Electronically  Signed   By: Maurine Simmering M.D.   On: 01/15/2022 12:11   DG Hip Unilat With Pelvis 2-3 Views Right  Result Date: 01/15/2022 CLINICAL DATA:  W a female age 42 presents following fall with hip injury, fall to RIGHT hip with pain on movement. EXAM: DG HIP (WITH OR WITHOUT PELVIS) 2-3V RIGHT COMPARISON:  Comparison made with May 27, 2012. FINDINGS: Marked interval worsening of degenerative change about the RIGHT hip since previous imaging, bone on bone contact is noted. Osteopenia limits assessment. No visible fracture. Lateral projection limited by cross-table projection. No signs of fracture of the bony pelvis. IMPRESSION: Marked interval worsening of degenerative change about the RIGHT hip. No visible fracture. Limited assessment due to osteopenia and cross-table projection. If there is high clinical suspicion for fracture CT may be helpful for further evaluation. Electronically Signed   By: Zetta Bills M.D.   On: 01/15/2022 10:00    Assessment/Plan: Pelvic fracture (HCC)  pelvic rami fx, WBAT, prn Norco available, f/u Ortho, pain with weight bearing or certain movement.   Urinary retention Voiding trial with foley removal 01/25/22 for urinary retention, currently on Flomax.   Paroxysmal atrial fibrillation (HCC) Heart rate is in control, on Eliquis, Carvedilol, Diltiazem. Obtain TSH   HYPERCHOLESTEROLEMIA On diet only.   Essential hypertension Blood pressure is controlled, continue Carvedilol, Diltiazem, prn Furosemide. Update CMP/eGFR  Mitral valve disorder F/u Cardiology.   S/P excision of acoustic neuroma MRI brain with and without contrast shows ring-enhancing 1 cm x 1 cm lesion concerning for acoustic neuroma. Discussed with Dr. Redmond Baseman.  Patient will follow-up with ENT outpatient, Neurology.   Vertigo prn Meclizine, f/u neurology, ENT,   Hyponatremia Na 132 01/18/22, update CMP/eGFR  Anemia Hgb 8.9 01/18/22, no apparent bleeding, will update CBC/diff, Iron, Ferritin, Vit  B12  Slow transit constipation Stable, diet and Colace to control.     Family/ staff Communication: plan of care reviewed with the patient and charge nurse.   Labs/tests ordered:  CBC, CMP, Iron, Ferritin, Vit B12, TSH   Time spend 35 minutes.

## 2022-01-22 ENCOUNTER — Non-Acute Institutional Stay (SKILLED_NURSING_FACILITY): Payer: Medicare Other | Admitting: Internal Medicine

## 2022-01-22 ENCOUNTER — Encounter: Payer: Self-pay | Admitting: Internal Medicine

## 2022-01-22 ENCOUNTER — Encounter: Payer: Self-pay | Admitting: Nurse Practitioner

## 2022-01-22 DIAGNOSIS — S32691D Other specified fracture of right ischium, subsequent encounter for fracture with routine healing: Secondary | ICD-10-CM | POA: Diagnosis not present

## 2022-01-22 DIAGNOSIS — D649 Anemia, unspecified: Secondary | ICD-10-CM | POA: Insufficient documentation

## 2022-01-22 DIAGNOSIS — L03113 Cellulitis of right upper limb: Secondary | ICD-10-CM

## 2022-01-22 DIAGNOSIS — E871 Hypo-osmolality and hyponatremia: Secondary | ICD-10-CM | POA: Insufficient documentation

## 2022-01-22 DIAGNOSIS — I059 Rheumatic mitral valve disease, unspecified: Secondary | ICD-10-CM

## 2022-01-22 DIAGNOSIS — S329XXA Fracture of unspecified parts of lumbosacral spine and pelvis, initial encounter for closed fracture: Secondary | ICD-10-CM | POA: Insufficient documentation

## 2022-01-22 DIAGNOSIS — I480 Paroxysmal atrial fibrillation: Secondary | ICD-10-CM | POA: Diagnosis not present

## 2022-01-22 DIAGNOSIS — I10 Essential (primary) hypertension: Secondary | ICD-10-CM

## 2022-01-22 DIAGNOSIS — E7800 Pure hypercholesterolemia, unspecified: Secondary | ICD-10-CM

## 2022-01-22 DIAGNOSIS — D509 Iron deficiency anemia, unspecified: Secondary | ICD-10-CM | POA: Insufficient documentation

## 2022-01-22 DIAGNOSIS — K5901 Slow transit constipation: Secondary | ICD-10-CM | POA: Insufficient documentation

## 2022-01-22 DIAGNOSIS — R42 Dizziness and giddiness: Secondary | ICD-10-CM

## 2022-01-22 DIAGNOSIS — R339 Retention of urine, unspecified: Secondary | ICD-10-CM

## 2022-01-22 NOTE — Assessment & Plan Note (Signed)
MRI brain with and without contrast shows ring-enhancing 1 cm x 1 cm lesion concerning for acoustic neuroma. Discussed with Dr. Jenne Pane.  Patient will follow-up with ENT outpatient, Neurology.

## 2022-01-22 NOTE — Assessment & Plan Note (Signed)
F/u Cardiology 

## 2022-01-22 NOTE — Assessment & Plan Note (Signed)
prn Meclizine, f/u neurology, ENT,

## 2022-01-22 NOTE — Assessment & Plan Note (Signed)
On diet only  ?

## 2022-01-22 NOTE — Assessment & Plan Note (Signed)
Voiding trial with foley removal 01/25/22 for urinary retention, currently on Flomax.

## 2022-01-22 NOTE — Assessment & Plan Note (Signed)
Blood pressure is controlled, continue Carvedilol, Diltiazem, prn Furosemide. Update CMP/eGFR

## 2022-01-22 NOTE — Assessment & Plan Note (Signed)
pelvic rami fx, WBAT, prn Norco available, f/u Ortho, pain with weight bearing or certain movement.

## 2022-01-22 NOTE — Assessment & Plan Note (Signed)
Hgb 8.9 01/18/22, no apparent bleeding, will update CBC/diff, Iron, Ferritin, Vit B12

## 2022-01-22 NOTE — Assessment & Plan Note (Addendum)
Heart rate is in control, on Eliquis, Carvedilol, Diltiazem. Obtain TSH

## 2022-01-22 NOTE — Assessment & Plan Note (Signed)
Na 132 01/18/22, update CMP/eGFR

## 2022-01-22 NOTE — Progress Notes (Signed)
Provider:  Veleta Miners MD Location:   [facility] Room Number: [phone] Place of Service:  SNF ([id number])  PCP: Michael Boston, MD Patient Care Team: Michael Boston, MD as PCP - General (Internal Medicine) Sueanne Margarita, MD as PCP - Cardiology (Cardiology)  Extended Emergency Contact Information Primary Emergency Contact: Roselie Skinner [place] Phone: [phone] Relation: Daughter  Code Status: DNR Managed Care Goals of Care: Advanced Directive information Advanced Directives 01/22/2022  Does Patient Have a Medical Advance Directive? Yes  Type of Advance Directive [place]  Does patient want to make changes to medical advance directive? No - Patient declined  Copy of [place] in Chart? -      Chief Complaint  Patient presents with   New Admit To SNF    Admission to SNF    HPI: Patient is a 81 y.o. female seen today for admission to SNF for rehab after a fall  Patient was admitted in the hospital from 2/21 to 2/25 after a fall and pelvic rami fracture.  Patient has a history of PAF, hypertension, ischemic cardiomyopathy with NSTEMI due to Coronary Spasms with MR and Moderate AR Also with vertigo.  With history of a acoustic neuroma.  Patient states that she gets off-and-on dizzy and has to be very careful when she walks. She lives by herself in the apartment.  She states that she got up in the morning and felt dizzy and lost her balance.  And fell. She was found to have a pelvic rami fracture Seen by orthopedics we recommended WBAT. She is already walking with PT OT and her walker She had an MRI done which showed recurrence of vestibular rash schwannoma.  Also showed dilated left superior ophthalmic vein possible venous varix  She also developed acute urinary retention and now has a Foley with plan for voiding trial in couple of days. Today patient's only complaint was swelling pain and redness  in the area of her right elbow going down her arm She continues to have some dizziness when she walks. She lives in a senior care apartment.  Is planning to move to friend's home.  Past Medical History:  Diagnosis Date   Anemia    Aortic insufficiency    Mild by echo 09/2020   Aortic stenosis    mild by echo 09/2019 but not present on echo 09/2020   Arthritis    Chicken pox    Heart attack ([place]) 2006   nontransmural MI seconday to vasispasm w normal coronary arteries   High cholesterol    Ischemic dilated cardiomyopathy ([place])    resolved   Mitral valve prolapse    with mild MR by echo 10/2015   White coat hypertension    Past Surgical History:  Procedure Laterality Date   acoustic neuroma   [place]  2009   CHOLECYSTECTOMY  1990   facial plastic surgery  [place]    reports that she has never smoked. She has never used smokeless tobacco. She reports current alcohol use of about 7.0 standard drinks per week. She reports that she does not use drugs. Social History   Socioeconomic History   Marital status: Widowed    Spouse name: Not on file   Number of children: Not on file   Years of education: Not on file   Highest education level: Not on file  Occupational History  Occupation: Pharmacist, hospital  Tobacco Use   Smoking status: Never   Smokeless tobacco: Never  Substance and Sexual Activity   Alcohol use: Yes    Alcohol/week: 7.0 standard drinks    Types: 7 Glasses of wine per week    Comment: 1 daily   Drug use: No   Sexual activity: Not on file  Other Topics Concern   Not on file  Social History Narrative   Widowed in 2012    Working part time   Social Determinants of Health   Financial Resource Strain: Not on file  Food Insecurity: Not on file  Transportation Needs: Not on file  Physical Activity: Not on file  Stress: Not on file  Social Connections: Not on file  Intimate Partner Violence: Not on file     Functional Status Survey:    Family History  Problem Relation Age of Onset   Hypertension Father    COPD Father    Hypertension Sister     Health Maintenance  Topic Date Due   Zoster Vaccines- Shingrix (1 of 2) Never done   MAMMOGRAM  01/07/2019   TETANUS/TDAP  10/12/2019   COVID-19 Vaccine (4 - Booster for Pfizer series) 06/05/2021   Pneumonia Vaccine 45+ Years old  Completed   INFLUENZA VACCINE  Completed   DEXA SCAN  Completed   HPV VACCINES  Aged Out    Allergies  Allergen Reactions   Fosamax [Alendronate Sodium]     Dental problems   Statins     Muscle pain    Ace Inhibitors Cough    Allergies as of 01/22/2022       Reactions   Fosamax [alendronate Sodium]    Dental problems   Statins    Muscle pain    Ace Inhibitors Cough        Medication List        Accurate as of January 22, 2022 12:34 PM. If you have any questions, ask your nurse or doctor.          apixaban 5 MG Tabs tablet Commonly known as: Eliquis Take 1 tablet (5 mg total) by mouth 2 (two) times daily.   calcium-vitamin D 500-200 MG-UNIT tablet Take 1 tablet by mouth daily.   carvedilol 6.25 MG tablet Commonly known as: COREG Take 1 tablet (6.25 mg total) by mouth 2 (two) times daily.   diltiazem 30 MG tablet Commonly known as: Cardizem Take 1 tablet every 4 hours AS NEEDED for AFIB heart rate >100   diphenhydrAMINE 25 MG tablet Commonly known as: BENADRYL Take 25 mg by mouth at bedtime as needed for allergies.   docusate sodium 100 MG capsule Commonly known as: COLACE Take 1 capsule (100 mg total) by mouth 2 (two) times daily.   furosemide 20 MG tablet Commonly known as: LASIX Take 1 tablet (20 mg total) by mouth as needed.   HYDROcodone-acetaminophen 5-325 MG tablet Commonly known as: NORCO/VICODIN Take 1-2 tablets by mouth every 6 (six) hours as needed for moderate pain or severe pain.   lactose free nutrition Liqd Take 237 mLs by mouth 2 (two) times daily  between meals.   Lubricant Eye Nighttime Oint Apply 1 application to eye in the morning and at bedtime.   meclizine 25 MG tablet Commonly known as: ANTIVERT Take 1 tablet (25 mg total) by mouth 3 (three) times daily as needed for dizziness.   multivitamin with minerals Tabs tablet Take 1 tablet by mouth daily.   ondansetron 4 MG disintegrating tablet  Commonly known as: ZOFRAN-ODT Take 1 tablet (4 mg total) by mouth every 8 (eight) hours as needed for nausea or vomiting.   tamsulosin 0.4 MG Caps capsule Commonly known as: FLOMAX Take 1 capsule (0.4 mg total) by mouth daily for 7 days.   vitamin B-12 100 MCG tablet Commonly known as: CYANOCOBALAMIN Take 100 mcg by mouth daily.        Review of Systems  Constitutional:  Positive for activity change. Negative for appetite change.  HENT: Negative.    Respiratory:  Negative for cough and shortness of breath.   Cardiovascular:  Negative for leg swelling.  Gastrointestinal:  Negative for constipation.  Genitourinary:  Positive for difficulty urinating.  Musculoskeletal:  Positive for gait problem. Negative for arthralgias and myalgias.  Skin:  Positive for color change and rash.  Neurological:  Positive for dizziness. Negative for weakness.  Psychiatric/Behavioral:  Negative for confusion, dysphoric mood and sleep disturbance.    Vitals:   01/22/22 1212  BP: 120/64  Pulse: 93  Resp: 16  Temp: 97.7 F (36.5 C)  SpO2: 97%  Weight: 156 lb 9.6 oz (71 kg)  Height: 5\' 8"  (1.727 m)   Body mass index is 23.81 kg/m. Physical Exam Vitals reviewed.  Constitutional:      Appearance: Normal appearance.  HENT:     Head: Normocephalic.     Nose: Nose normal.     Mouth/Throat:     Mouth: Mucous membranes are moist.     Pharynx: Oropharynx is clear.  Eyes:     Pupils: Pupils are equal, round, and reactive to light.  Cardiovascular:     Rate and Rhythm: Normal rate and regular rhythm.     Pulses: Normal pulses.     Heart  sounds: Murmur heard.  Pulmonary:     Effort: Pulmonary effort is normal.     Breath sounds: Normal breath sounds.  Abdominal:     General: Abdomen is flat. Bowel sounds are normal.     Palpations: Abdomen is soft.  Musculoskeletal:        General: No swelling.     Cervical back: Neck supple.     Comments: Area above the right Elbow is red warm going down in the middle of the forearm Elbow is not painful an she is able to move it  Skin:    General: Skin is warm.  Neurological:     General: No focal deficit present.     Mental Status: She is alert and oriented to person, place, and time.  Psychiatric:        Mood and Affect: Mood normal.        Thought Content: Thought content normal.    Labs reviewed: Basic Metabolic Panel: Recent Labs    01/15/22 0914 01/15/22 1951 01/16/22 0515 01/18/22 0540  NA 136  --  135 132*  K 3.9  --  4.2 3.7  CL 107  --  108 106  CO2 24  --  23 22  GLUCOSE 102*  --  103* 106*  BUN 13  --  14 13  CREATININE 0.97 0.96 0.79 0.77  CALCIUM 8.4*  --  7.9* 8.0*   Liver Function Tests: No results for input(s): AST, ALT, ALKPHOS, BILITOT, PROT, ALBUMIN in the last 8760 hours. No results for input(s): LIPASE, AMYLASE in the last 8760 hours. No results for input(s): AMMONIA in the last 8760 hours. CBC: Recent Labs    01/15/22 0914 01/15/22 1951 01/16/22 0515 01/18/22 0540  WBC  9.3 8.1 7.2 9.5  NEUTROABS 6.7  --   --   --   HGB 10.3* 9.6* 9.6* 8.9*  HCT 32.0* 30.8* 30.5* 27.7*  MCV 84.9 85.3 85.9 84.2  PLT 190 171 169 164   Cardiac Enzymes: No results for input(s): CKTOTAL, CKMB, CKMBINDEX, TROPONINI in the last 8760 hours. BNP: Invalid input(s): POCBNP No results found for: HGBA1C Lab Results  Component Value Date   TSH 1.497 07/26/2019   No results found for: VITAMINB12 No results found for: FOLATE No results found for: IRON, TIBC, FERRITIN  Imaging and Procedures obtained prior to SNF admission: DG Chest 1 View  Result Date:  01/15/2022 CLINICAL DATA:  An 81 year old female presents following fall. EXAM: CHEST  1 VIEW COMPARISON:  July 15, 2019 FINDINGS: Image rotated to the LEFT. Accounting for this cardiomediastinal contours and hilar structures are stable. Lungs are clear. No visible pneumothorax. On limited assessment there is no visible acute bony abnormality. IMPRESSION: No acute cardiopulmonary disease. Electronically Signed   By: Zetta Bills M.D.   On: 01/15/2022 10:02   MR BRAIN W WO CONTRAST  Result Date: 01/16/2022 CLINICAL DATA:  Neuro deficit, stroke suspected. Dizziness. History of acoustic neuroma removal in 1991 EXAM: MRI HEAD WITHOUT AND WITH CONTRAST TECHNIQUE: Multiplanar, multiecho pulse sequences of the brain and surrounding structures were obtained without and with intravenous contrast. CONTRAST:  31mL GADAVIST GADOBUTROL 1 MMOL/ML IV SOLN COMPARISON:  None. FINDINGS: Brain: There is no evidence of acute intracranial hemorrhage, extra-axial fluid collection, or acute infarct. Parenchymal volume is normal. The ventricles are normal in size. There is a minimal burden of chronic white matter microangiopathic change. There is no abnormal parenchymal enhancement. Postsurgical changes reflecting left suboccipital craniectomy are seen, likely related to the reported history of vestibular schwannoma removal. There is a 0.9 cm by 1.0 cm enhancing lesion in the left IAC. There is no other mass lesion. There is no mass effect or midline shift. Vascular: Normal flow voids. Skull and upper cervical spine: Normal marrow signal. Sinuses/Orbits: The paranasal sinuses are clear. The globes are unremarkable. There is a tubular enhancing structure along the roof of the left orbit measuring 2.2 cm x 1.1 cm which appears contiguous with the superior ophthalmic vein anteriorly. There is no proptosis or bulging or asymmetric enhancement of the left cavernous sinus. Other: None. IMPRESSION: 1. 0.9 cm x 1.0 cm enhancing lesion in  the left IAC consistent with a vestibular schwannoma. 2. Dilated left superior ophthalmic vein may reflect a venous varix. A carotid cavernous fistula could have a similar appearance, but there is no proptosis or asymmetry of the cavernous sinuses, making this diagnosis less likely. Correlate with any left orbital symptoms. Electronically Signed   By: Valetta Mole M.D.   On: 01/16/2022 15:12   CT Hip Right Wo Contrast  Result Date: 01/15/2022 CLINICAL DATA:  Hip trauma, fracture suspected, xray done EXAM: CT OF THE RIGHT HIP WITHOUT CONTRAST TECHNIQUE: Multidetector CT imaging of the right hip was performed according to the standard protocol. Multiplanar CT image reconstructions were also generated. RADIATION DOSE REDUCTION: This exam was performed according to the departmental dose-optimization program which includes automated exposure control, adjustment of the mA and/or kV according to patient size and/or use of iterative reconstruction technique. COMPARISON:  Same day radiograph and 05/27/2012 FINDINGS: Bones/Joint/Cartilage There is a minimally displaced right inferior pubic ramus fracture. There is a nondisplaced right sacral ala fracture in zone 1 which extends inferiorly in close proximity to the anterior  aspect of the SI joint. Additionally, there is a nondisplaced fracture at the superior pubic root near the acetabulum (coronal image 31, axial image 91). There is no evidence of a femoral neck fracture. There is severe right hip osteoarthritis with bulky osteophyte formation, subchondral sclerosis and cystic change, which has by markedly progressed since 2013. Ligaments Suboptimally assessed by CT. Muscles and Tendons There is severe gluteus minimus muscle atrophy. No intramuscular collection. Soft tissues Soft tissue swelling adjacent to the fractures. Sigmoid diverticulosis. IMPRESSION: Minimally displaced right inferior pubic ramus fracture. Nondisplaced right sacral ala fracture in zone 1 extending  inferiorly in close proximity to the anterior aspect of the sacroiliac joint. Nondisplaced fracture at the superior pubic root near the acetabulum. No evidence of femoral neck fracture. Severe right hip osteoarthritis, markedly progressed since 2013. Electronically Signed   By: Maurine Simmering M.D.   On: 01/15/2022 12:11   DG Hip Unilat With Pelvis 2-3 Views Right  Result Date: 01/15/2022 CLINICAL DATA:  W a female age 74 presents following fall with hip injury, fall to RIGHT hip with pain on movement. EXAM: DG HIP (WITH OR WITHOUT PELVIS) 2-3V RIGHT COMPARISON:  Comparison made with May 27, 2012. FINDINGS: Marked interval worsening of degenerative change about the RIGHT hip since previous imaging, bone on bone contact is noted. Osteopenia limits assessment. No visible fracture. Lateral projection limited by cross-table projection. No signs of fracture of the bony pelvis. IMPRESSION: Marked interval worsening of degenerative change about the RIGHT hip. No visible fracture. Limited assessment due to osteopenia and cross-table projection. If there is high clinical suspicion for fracture CT may be helpful for further evaluation. Electronically Signed   By: Zetta Bills M.D.   On: 01/15/2022 10:00    Assessment/Plan 1. Cellulitis of right elbow Doxycycline 100 mg for 7 days   2. Other closed fracture of right ischium with routine healing, subsequent encounter Working with Therapy Pain control with Norco  3. Urinary retention On Flomax Voiding trial in 1 week  4. Paroxysmal atrial fibrillation (HCC) Continue Eliquis and Coreg  5. Anemia Iron studies and B 12 pending and repeat CBC is pending Most likely due to Blood loss 6. Essential hypertension On low dose of Coreg  7. Mitral valve disorder Has follow up with Cardiology for TEE  8. Vertigo with Acoustic Neuroma Meclizine Prn Follow with Dr Redmond Baseman     Family/ staff Communication:   Labs/tests ordered:

## 2022-01-22 NOTE — Assessment & Plan Note (Signed)
Stable, diet and Colace to control.

## 2022-01-25 ENCOUNTER — Non-Acute Institutional Stay (SKILLED_NURSING_FACILITY): Payer: Medicare Other | Admitting: Nurse Practitioner

## 2022-01-25 ENCOUNTER — Encounter: Payer: Self-pay | Admitting: Nurse Practitioner

## 2022-01-25 DIAGNOSIS — I059 Rheumatic mitral valve disease, unspecified: Secondary | ICD-10-CM

## 2022-01-25 DIAGNOSIS — I480 Paroxysmal atrial fibrillation: Secondary | ICD-10-CM

## 2022-01-25 DIAGNOSIS — Z9889 Other specified postprocedural states: Secondary | ICD-10-CM

## 2022-01-25 DIAGNOSIS — Z86018 Personal history of other benign neoplasm: Secondary | ICD-10-CM

## 2022-01-25 DIAGNOSIS — S32691D Other specified fracture of right ischium, subsequent encounter for fracture with routine healing: Secondary | ICD-10-CM

## 2022-01-25 DIAGNOSIS — E871 Hypo-osmolality and hyponatremia: Secondary | ICD-10-CM

## 2022-01-25 DIAGNOSIS — K5901 Slow transit constipation: Secondary | ICD-10-CM | POA: Diagnosis not present

## 2022-01-25 DIAGNOSIS — M25521 Pain in right elbow: Secondary | ICD-10-CM | POA: Insufficient documentation

## 2022-01-25 DIAGNOSIS — I10 Essential (primary) hypertension: Secondary | ICD-10-CM

## 2022-01-25 DIAGNOSIS — M25421 Effusion, right elbow: Secondary | ICD-10-CM

## 2022-01-25 DIAGNOSIS — R42 Dizziness and giddiness: Secondary | ICD-10-CM

## 2022-01-25 DIAGNOSIS — D509 Iron deficiency anemia, unspecified: Secondary | ICD-10-CM | POA: Diagnosis not present

## 2022-01-25 DIAGNOSIS — E7800 Pure hypercholesterolemia, unspecified: Secondary | ICD-10-CM

## 2022-01-25 DIAGNOSIS — R339 Retention of urine, unspecified: Secondary | ICD-10-CM

## 2022-01-25 LAB — HEPATIC FUNCTION PANEL
ALT: 71 — AB (ref 7–35)
AST: 33 (ref 13–35)
Alkaline Phosphatase: 158 — AB (ref 25–125)
Bilirubin, Total: 0.5

## 2022-01-25 LAB — IRON,TIBC AND FERRITIN PANEL
Ferritin: 87
Iron: 23

## 2022-01-25 LAB — TSH: TSH: 1.72 (ref 0.41–5.90)

## 2022-01-25 LAB — COMPREHENSIVE METABOLIC PANEL
Albumin: 3.3 — AB (ref 3.5–5.0)
Calcium: 8.4 — AB (ref 8.7–10.7)
Globulin: 2.3

## 2022-01-25 LAB — BASIC METABOLIC PANEL
BUN: 20 (ref 4–21)
CO2: 25 — AB (ref 13–22)
Chloride: 104 (ref 99–108)
Creatinine: 0.9 (ref 0.5–1.1)
Glucose: 88
Potassium: 3.9 (ref 3.4–5.3)
Sodium: 137 (ref 137–147)

## 2022-01-25 LAB — CBC AND DIFFERENTIAL
HCT: 26 — AB (ref 36–46)
Hemoglobin: 8.4 — AB (ref 12.0–16.0)
Neutrophils Absolute: 5757
Platelets: 238 (ref 150–399)
WBC: 9.3

## 2022-01-25 LAB — CBC: RBC: 2.99 — AB (ref 3.87–5.11)

## 2022-01-25 LAB — VITAMIN B12: Vitamin B-12: 562

## 2022-01-25 NOTE — Assessment & Plan Note (Signed)
Hgb 8.9 01/18/22>>8.4, Iron 23, Vit B23 562, 01/24/22, on Vit B12. Adding Iron MWF with meal, f/u CBC one wk.  ?

## 2022-01-25 NOTE — Assessment & Plan Note (Signed)
Blood pressure is controlled, takes Carvedilol, prn Furosemide, Bun/creat 20/0.87 01/24/22  ?

## 2022-01-25 NOTE — Assessment & Plan Note (Signed)
Mild, prn Meclizine, f/u neurology, ENT,  ?

## 2022-01-25 NOTE — Assessment & Plan Note (Signed)
diet control.   

## 2022-01-25 NOTE — Assessment & Plan Note (Signed)
,   Na 137 01/24/22 ?

## 2022-01-25 NOTE — Assessment & Plan Note (Signed)
Voiding trial today

## 2022-01-25 NOTE — Progress Notes (Signed)
Location:   SNF [facility] Room Number: 40 Place of Service:  SNF (31) [facility]  Melida QuitterWile, Laura H, MD  Patient Care Team: Melida QuitterWile, Laura H, MD as PCP - General (Internal Medicine) Quintella Reicherturner, Traci R, MD as PCP - Cardiology (Cardiology)  Extended Emergency Contact Information Primary Emergency Contact: York Ceriseouch,Holly  [place] Home Phone: [phone] Relation: Daughter  Code Status: DNR Goals of care: Advanced Directive information Advanced Directives 01/22/2022  Does Patient Have a Medical Advance Directive? Yes  Type of Advance Directive Healthcare Power of Attorney  Does patient want to make changes to medical advance directive? No - Patient declined  Copy of Healthcare Power of Attorney in Chart? -     Chief Complaint  Patient presents with   Acute Visit    Right elbow red, warm, swollen    HPI:  Pt is a 81 y.o. female seen today for an acute visit for the right elbow redness, swelling, warmth from previous contusion/abrasion sustained from fall. Able to ROM, no deformity, pain when palpated.  Hospitalized 01/15/22-01/19/22 for fall, vertigo, pelvic rami fx, WBAT, prn Norco available, f/u Ortho,  f/u ENT. Voiding trial with foley removal 01/25/22 for urinary retention, currently on Flomax.              Hx of Afib, on Eliquis, Carvedilol, Diltiazem, TSH 1.72 01/24/22             Hyperlipidemia, diet control.              HTN, takes Carvedilol, prn Furosemide, Bun/creat 20/0.87 01/24/22             Mitral valve disorder, f/u cardiology.              Ischemic cardiomyopathy, f/u cardiology             Acoustic neuroma, MRI brain with and without contrast shows ring-enhancing 1 cm x 1 cm lesion concerning for acoustic neuroma. Discussed with Dr. Jenne PaneBates.  Patient will follow-up with ENT outpatient, Neurology.              Vertigo: prn Meclizine, f/u neurology, ENT,              Hyponatremia, Na 137 01/24/22             Anemia, Hgb 8.9 01/18/22>>8.4, Iron 23,  Vit B23 562, 01/24/22, on Vit B12. Adding Iron MWF with meal, f/u CBC one wk.              Constipation, takes Colace  Past Medical History:  Diagnosis Date   Anemia    Aortic insufficiency    Mild by echo 09/2020   Aortic stenosis    mild by echo 09/2019 but not present on echo 09/2020   Arthritis    Chicken pox    Heart attack (HCC) 2006   nontransmural MI seconday to vasispasm w normal coronary arteries   High cholesterol    Ischemic dilated cardiomyopathy (HCC)    resolved   Mitral valve prolapse    with mild MR by echo 10/2015   White coat hypertension    Past Surgical History:  Procedure Laterality Date   acoustic neuroma   1991   ANKLE SURGERY  2009   CHOLECYSTECTOMY  1990   facial plastic surgery  1996   TONSILLECTOMY AND ADENOIDECTOMY  1946    Allergies  Allergen Reactions   Fosamax [Alendronate Sodium]     Dental problems   Statins  Muscle pain    Ace Inhibitors Cough    Allergies as of 01/25/2022       Reactions   Fosamax [alendronate Sodium]    Dental problems   Statins    Muscle pain    Ace Inhibitors Cough        Medication List        Accurate as of January 25, 2022 11:59 PM. If you have any questions, ask your nurse or doctor.          apixaban 5 MG Tabs tablet Commonly known as: Eliquis Take 1 tablet (5 mg total) by mouth 2 (two) times daily.   calcium-vitamin D 500-200 MG-UNIT tablet Take 1 tablet by mouth daily.   carvedilol 6.25 MG tablet Commonly known as: COREG Take 1 tablet (6.25 mg total) by mouth 2 (two) times daily.   diltiazem 30 MG tablet Commonly known as: Cardizem Take 1 tablet every 4 hours AS NEEDED for AFIB heart rate >100   diphenhydrAMINE 25 MG tablet Commonly known as: BENADRYL Take 25 mg by mouth at bedtime as needed for allergies.   docusate sodium 100 MG capsule Commonly known as: COLACE Take 1 capsule (100 mg total) by mouth 2 (two) times daily.   doxycycline 100 MG capsule Commonly known as:  VIBRAMYCIN Take 100 mg by mouth 2 (two) times daily.   furosemide 20 MG tablet Commonly known as: LASIX Take 1 tablet (20 mg total) by mouth as needed.   HYDROcodone-acetaminophen 5-325 MG tablet Commonly known as: NORCO/VICODIN Take 1-2 tablets by mouth every 6 (six) hours as needed for moderate pain or severe pain.   lactose free nutrition Liqd Take 237 mLs by mouth 2 (two) times daily between meals.   Lubricant Eye Nighttime Oint Apply 1 application to eye in the morning and at bedtime.   meclizine 12.5 MG tablet Commonly known as: ANTIVERT Take 12.5 mg by mouth 3 (three) times daily as needed for dizziness. What changed: Another medication with the same name was removed. Continue taking this medication, and follow the directions you see here. Changed by: Lesli Issa X Jersey Ravenscroft, NP   multivitamin with minerals Tabs tablet Take 1 tablet by mouth daily.   ondansetron 4 MG disintegrating tablet Commonly known as: ZOFRAN-ODT Take 1 tablet (4 mg total) by mouth every 8 (eight) hours as needed for nausea or vomiting.   saccharomyces boulardii 250 MG capsule Commonly known as: FLORASTOR Take 250 mg by mouth 2 (two) times daily.   tamsulosin 0.4 MG Caps capsule Commonly known as: FLOMAX Take 1 capsule (0.4 mg total) by mouth daily for 7 days.   vitamin B-12 100 MCG tablet Commonly known as: CYANOCOBALAMIN Take 100 mcg by mouth daily.        Review of Systems  Constitutional:  Positive for unexpected weight change. Negative for activity change, appetite change and fever.       #13Ibs weight loss in 4-5 days?-measurement mistake?   HENT:  Negative for congestion and trouble swallowing.   Eyes:  Negative for visual disturbance.  Respiratory:  Negative for cough, shortness of breath and wheezing.   Cardiovascular:  Negative for chest pain, palpitations and leg swelling.  Gastrointestinal:  Negative for abdominal pain and constipation.  Genitourinary:  Negative for difficulty  urinating, dysuria, frequency and urgency.       Foley catheter is removed.   Musculoskeletal:  Positive for arthralgias and gait problem.       Right hip/thigh pain with weight bearing or certain  way the right leg movement.   Skin:  Positive for wound.  Neurological:  Positive for dizziness, facial asymmetry and weakness. Negative for speech difficulty.  Psychiatric/Behavioral:  Negative for confusion and sleep disturbance. The patient is not nervous/anxious.    Immunization History  Administered Date(s) Administered   Fluad Quad(high Dose 65+) 08/31/2019   Influenza Split 08/28/2011, 09/04/2012   Influenza Whole 08/31/2004, 09/11/2007, 08/23/2008, 09/05/2009, 08/21/2010   Influenza, High Dose Seasonal PF 09/26/2018, 09/26/2018, 09/19/2020   Influenza, Quadrivalent, Recombinant, Inj, Pf 09/04/2021   Influenza,inj,Quad PF,6+ Mos 09/02/2013, 09/22/2014, 09/11/2015, 08/30/2016, 09/24/2017   PFIZER(Purple Top)SARS-COV-2 Vaccination 08/16/2020, 10/13/2020, 04/10/2021   Pneumococcal Conjugate-13 01/12/2016   Pneumococcal Polysaccharide-23 06/29/2012, 01/10/2022   Td 11/25/1989, 10/11/2009   Pertinent  Health Maintenance Due  Topic Date Due   MAMMOGRAM  01/07/2019   INFLUENZA VACCINE  Completed   DEXA SCAN  Completed   Fall Risk 01/16/2022 01/17/2022 01/17/2022 01/18/2022 01/18/2022  Falls in the past year? - - - - -  Patient Fall Risk Level High fall risk High fall risk High fall risk High fall risk High fall risk  Patient at Risk for Falls Due to - - - - -   Functional Status Survey:    Vitals:   01/25/22 1605  BP: (!) 151/77  Pulse: 92  Resp: 17  Temp: 97.7 F (36.5 C)  SpO2: 96%  Weight: 143 lb 4.8 oz (65 kg)  Height: 5\' 8"  (1.727 m)   Body mass index is 21.79 kg/m. Physical Exam Vitals and nursing note reviewed.  Constitutional:      Appearance: Normal appearance.  HENT:     Head: Normocephalic and atraumatic.     Nose: Nose normal.     Mouth/Throat:     Mouth:  Mucous membranes are moist.  Eyes:     Extraocular Movements: Extraocular movements intact.     Conjunctiva/sclera: Conjunctivae normal.     Pupils: Pupils are equal, round, and reactive to light.  Cardiovascular:     Rate and Rhythm: Normal rate and regular rhythm.     Heart sounds: Murmur heard.  Pulmonary:     Effort: Pulmonary effort is normal.     Breath sounds: No rales.  Abdominal:     General: Bowel sounds are normal.     Palpations: Abdomen is soft.     Tenderness: There is no abdominal tenderness.  Musculoskeletal:        General: Tenderness present.     Cervical back: Normal range of motion and neck supple.     Right lower leg: No edema.     Left lower leg: No edema.     Comments: Right hip/thigh region pain with weight bearing and movement.   Skin:    General: Skin is warm and dry.     Findings: Erythema present.     Comments: The right elbow swelling, warmth, mild redness, pain when palpated, no decreased ROM. A small scabbed over region from previous abrasion noted.   Neurological:     Mental Status: She is alert and oriented to person, place, and time. Mental status is at baseline.     Cranial Nerves: Cranial nerve deficit present.     Motor: Weakness present.     Gait: Gait abnormal.     Comments: Left facial weakness from acoustic neuroma removal about 30 years ago.   Psychiatric:        Mood and Affect: Mood normal.        Behavior: Behavior normal.  Thought Content: Thought content normal.        Judgment: Judgment normal.    Labs reviewed: Recent Labs    01/15/22 0914 01/15/22 1951 01/16/22 0515 01/18/22 0540 01/25/22 0000  NA 136  --  135 132* 137  K 3.9  --  4.2 3.7 3.9  CL 107  --  108 106 104  CO2 24  --  23 22 25*  GLUCOSE 102*  --  103* 106*  --   BUN 13  --  14 13 20   CREATININE 0.97   < > 0.79 0.77 0.9  CALCIUM 8.4*  --  7.9* 8.0* 8.4*   < > = values in this interval not displayed.   Recent Labs    01/25/22 0000  AST 33   ALT 71*  ALKPHOS 158*  ALBUMIN 3.3*   Recent Labs    01/15/22 0914 01/15/22 1951 01/16/22 0515 01/18/22 0540 01/25/22 0000  WBC 9.3 8.1 7.2 9.5 9.3  NEUTROABS 6.7  --   --   --  5,757.00  HGB 10.3* 9.6* 9.6* 8.9* 8.4*  HCT 32.0* 30.8* 30.5* 27.7* 26*  MCV 84.9 85.3 85.9 84.2  --   PLT 190 171 169 164 238   Lab Results  Component Value Date   TSH 1.72 01/25/2022   No results found for: HGBA1C Lab Results  Component Value Date   CHOL 231 (H) 01/25/2019   HDL 48.50 01/25/2019   LDLCALC 121 (H) 01/04/2016   LDLDIRECT 145.0 01/25/2019   TRIG 259.0 (H) 01/25/2019   CHOLHDL 5 01/25/2019    Significant Diagnostic Results in last 30 days:  DG Chest 1 View  Result Date: 01/15/2022 CLINICAL DATA:  An 81 year old female presents following fall. EXAM: CHEST  1 VIEW COMPARISON:  July 15, 2019 FINDINGS: Image rotated to the LEFT. Accounting for this cardiomediastinal contours and hilar structures are stable. Lungs are clear. No visible pneumothorax. On limited assessment there is no visible acute bony abnormality. IMPRESSION: No acute cardiopulmonary disease. Electronically Signed   By: Donzetta Kohut M.D.   On: 01/15/2022 10:02   MR BRAIN W WO CONTRAST  Result Date: 01/16/2022 CLINICAL DATA:  Neuro deficit, stroke suspected. Dizziness. History of acoustic neuroma removal in 1991 EXAM: MRI HEAD WITHOUT AND WITH CONTRAST TECHNIQUE: Multiplanar, multiecho pulse sequences of the brain and surrounding structures were obtained without and with intravenous contrast. CONTRAST:  8mL GADAVIST GADOBUTROL 1 MMOL/ML IV SOLN COMPARISON:  None. FINDINGS: Brain: There is no evidence of acute intracranial hemorrhage, extra-axial fluid collection, or acute infarct. Parenchymal volume is normal. The ventricles are normal in size. There is a minimal burden of chronic white matter microangiopathic change. There is no abnormal parenchymal enhancement. Postsurgical changes reflecting left suboccipital  craniectomy are seen, likely related to the reported history of vestibular schwannoma removal. There is a 0.9 cm by 1.0 cm enhancing lesion in the left IAC. There is no other mass lesion. There is no mass effect or midline shift. Vascular: Normal flow voids. Skull and upper cervical spine: Normal marrow signal. Sinuses/Orbits: The paranasal sinuses are clear. The globes are unremarkable. There is a tubular enhancing structure along the roof of the left orbit measuring 2.2 cm x 1.1 cm which appears contiguous with the superior ophthalmic vein anteriorly. There is no proptosis or bulging or asymmetric enhancement of the left cavernous sinus. Other: None. IMPRESSION: 1. 0.9 cm x 1.0 cm enhancing lesion in the left IAC consistent with a vestibular schwannoma. 2. Dilated left superior ophthalmic  vein may reflect a venous varix. A carotid cavernous fistula could have a similar appearance, but there is no proptosis or asymmetry of the cavernous sinuses, making this diagnosis less likely. Correlate with any left orbital symptoms. Electronically Signed   By: Lesia Hausen M.D.   On: 01/16/2022 15:12   CT Hip Right Wo Contrast  Result Date: 01/15/2022 CLINICAL DATA:  Hip trauma, fracture suspected, xray done EXAM: CT OF THE RIGHT HIP WITHOUT CONTRAST TECHNIQUE: Multidetector CT imaging of the right hip was performed according to the standard protocol. Multiplanar CT image reconstructions were also generated. RADIATION DOSE REDUCTION: This exam was performed according to the departmental dose-optimization program which includes automated exposure control, adjustment of the mA and/or kV according to patient size and/or use of iterative reconstruction technique. COMPARISON:  Same day radiograph and 05/27/2012 FINDINGS: Bones/Joint/Cartilage There is a minimally displaced right inferior pubic ramus fracture. There is a nondisplaced right sacral ala fracture in zone 1 which extends inferiorly in close proximity to the anterior  aspect of the SI joint. Additionally, there is a nondisplaced fracture at the superior pubic root near the acetabulum (coronal image 31, axial image 91). There is no evidence of a femoral neck fracture. There is severe right hip osteoarthritis with bulky osteophyte formation, subchondral sclerosis and cystic change, which has by markedly progressed since 2013. Ligaments Suboptimally assessed by CT. Muscles and Tendons There is severe gluteus minimus muscle atrophy. No intramuscular collection. Soft tissues Soft tissue swelling adjacent to the fractures. Sigmoid diverticulosis. IMPRESSION: Minimally displaced right inferior pubic ramus fracture. Nondisplaced right sacral ala fracture in zone 1 extending inferiorly in close proximity to the anterior aspect of the sacroiliac joint. Nondisplaced fracture at the superior pubic root near the acetabulum. No evidence of femoral neck fracture. Severe right hip osteoarthritis, markedly progressed since 2013. Electronically Signed   By: Caprice Renshaw M.D.   On: 01/15/2022 12:11   DG Hip Unilat With Pelvis 2-3 Views Right  Result Date: 01/15/2022 CLINICAL DATA:  W a female age 22 presents following fall with hip injury, fall to RIGHT hip with pain on movement. EXAM: DG HIP (WITH OR WITHOUT PELVIS) 2-3V RIGHT COMPARISON:  Comparison made with May 27, 2012. FINDINGS: Marked interval worsening of degenerative change about the RIGHT hip since previous imaging, bone on bone contact is noted. Osteopenia limits assessment. No visible fracture. Lateral projection limited by cross-table projection. No signs of fracture of the bony pelvis. IMPRESSION: Marked interval worsening of degenerative change about the RIGHT hip. No visible fracture. Limited assessment due to osteopenia and cross-table projection. If there is high clinical suspicion for fracture CT may be helpful for further evaluation. Electronically Signed   By: Donzetta Kohut M.D.   On: 01/15/2022 10:00     Assessment/Plan: Pain and swelling of right elbow he right elbow redness, swelling, warmth from previous contusion/abrasion sustained from fall. Able to ROM, no deformity, pain when palpated.  X-ray 3 views R elbow 01/26/22 no acute abnormality.  will apply 1% Diclofenac gel qid x 2 weeks, observe for s/s of infection.    Iron deficiency anemia Hgb 8.9 01/18/22>>8.4, Iron 23, Vit B23 562, 01/24/22, on Vit B12. Adding Iron MWF with meal, f/u CBC one wk.   Slow transit constipation Stable,  takes Colace  Hyponatremia , Na 137 01/24/22  Vertigo Mild, prn Meclizine, f/u neurology, ENT,   S/P excision of acoustic neuroma MRI brain with and without contrast shows ring-enhancing 1 cm x 1 cm lesion concerning for  acoustic neuroma. Discussed with Dr. Jenne Pane.  Patient will follow-up with ENT outpatient, Neurology.   Mitral valve disorder f/u cardiology.   Essential hypertension Blood pressure is controlled, takes Carvedilol, prn Furosemide, Bun/creat 20/0.87 01/24/22   HYPERCHOLESTEROLEMIA diet control.   Paroxysmal atrial fibrillation (HCC) Heart rate is in control, on Eliquis, Carvedilol, Diltiazem, TSH 1.72 01/24/22  Urinary retention Voiding trial today.   Pelvic fracture (HCC)  pelvic rami fx, WBAT, prn Norco available, f/u Ortho, working with therapy    Family/ staff Communication: plan of care reviewed with the patient and charge nurse.   Labs/tests ordered:  X-ray R elbow 3 views  Time spend 35 minutes.

## 2022-01-25 NOTE — Assessment & Plan Note (Signed)
MRI brain with and without contrast shows ring-enhancing 1 cm x 1 cm lesion concerning for acoustic neuroma. °Discussed with Dr. Bates.  Patient will follow-up with ENT outpatient, Neurology.  °

## 2022-01-25 NOTE — Assessment & Plan Note (Signed)
Heart rate is in control,  on Eliquis, Carvedilol, Diltiazem, TSH 1.72 01/24/22 ?

## 2022-01-25 NOTE — Assessment & Plan Note (Signed)
pelvic rami fx, WBAT, prn Norco available, f/u Ortho, working with therapy ?

## 2022-01-25 NOTE — Assessment & Plan Note (Addendum)
he right elbow redness, swelling, warmth from previous contusion/abrasion sustained from fall. Able to ROM, no deformity, pain when palpated.  ?X-ray 3 views R elbow 01/26/22 no acute abnormality.  will apply 1% Diclofenac gel qid x 2 weeks, observe for s/s of infection.  ? ?

## 2022-01-25 NOTE — Assessment & Plan Note (Signed)
f/u cardiology

## 2022-01-25 NOTE — Assessment & Plan Note (Signed)
Stable, takes Colace.  

## 2022-01-28 ENCOUNTER — Encounter: Payer: Self-pay | Admitting: Nurse Practitioner

## 2022-01-31 LAB — CBC AND DIFFERENTIAL
HCT: 28 — AB (ref 36–46)
Hemoglobin: 9.2 — AB (ref 12.0–16.0)
Neutrophils Absolute: 5377
Platelets: 409 10*3/uL — AB (ref 150–400)
WBC: 9.5

## 2022-01-31 LAB — CBC: RBC: 3.28 — AB (ref 3.87–5.11)

## 2022-02-07 ENCOUNTER — Encounter: Payer: Self-pay | Admitting: Nurse Practitioner

## 2022-02-07 ENCOUNTER — Non-Acute Institutional Stay (SKILLED_NURSING_FACILITY): Payer: Medicare Other | Admitting: Nurse Practitioner

## 2022-02-07 DIAGNOSIS — I059 Rheumatic mitral valve disease, unspecified: Secondary | ICD-10-CM

## 2022-02-07 DIAGNOSIS — E7800 Pure hypercholesterolemia, unspecified: Secondary | ICD-10-CM | POA: Diagnosis not present

## 2022-02-07 DIAGNOSIS — I10 Essential (primary) hypertension: Secondary | ICD-10-CM

## 2022-02-07 DIAGNOSIS — Z9889 Other specified postprocedural states: Secondary | ICD-10-CM

## 2022-02-07 DIAGNOSIS — R339 Retention of urine, unspecified: Secondary | ICD-10-CM

## 2022-02-07 DIAGNOSIS — E871 Hypo-osmolality and hyponatremia: Secondary | ICD-10-CM

## 2022-02-07 DIAGNOSIS — R42 Dizziness and giddiness: Secondary | ICD-10-CM

## 2022-02-07 DIAGNOSIS — I480 Paroxysmal atrial fibrillation: Secondary | ICD-10-CM

## 2022-02-07 DIAGNOSIS — S32691D Other specified fracture of right ischium, subsequent encounter for fracture with routine healing: Secondary | ICD-10-CM | POA: Diagnosis not present

## 2022-02-07 DIAGNOSIS — I255 Ischemic cardiomyopathy: Secondary | ICD-10-CM

## 2022-02-07 DIAGNOSIS — Z86018 Personal history of other benign neoplasm: Secondary | ICD-10-CM

## 2022-02-07 NOTE — Assessment & Plan Note (Signed)
on Eliquis, Carvedilol, Diltiazem, TSH 1.72 01/24/22 ?

## 2022-02-07 NOTE — Assessment & Plan Note (Signed)
pelvic rami fx, WBAT, prn Norco available, Tylenol is adequate for pain now, f/u Ortho, ambulates with walker, w/c to go further.  ?

## 2022-02-07 NOTE — Assessment & Plan Note (Signed)
Mitral valve disorder, f/u cardiology.  ?

## 2022-02-07 NOTE — Progress Notes (Signed)
?Location:   [facility] ?Nursing Home Room Number: 819 ?Place of Service:  ALF (13) ? ?Provider: Chipper OmanManXie Guinevere Stephenson NP ? ?PCP: Melida QuitterWile, Laura H, MD ?Patient Care Team: ?Melida QuitterWile, Laura H, MD as PCP - General (Internal Medicine) ?Quintella Reicherturner, Traci R, MD as PCP - Cardiology (Cardiology) ? ?Extended Emergency Contact Information ?Primary Emergency Contact: Rouch,Holly ? [place] ?Home Phone: [phone] ?Relation: Daughter ? ?Code Status: DNR ?Goals of care:  Advanced Directive information ?Advanced Directives 02/07/2022  ?Does Patient Have a Medical Advance Directive? Yes  ?Type of Advance Directive Healthcare Power of Attorney  ?Does patient want to make changes to medical advance directive? No - Patient declined  ?Copy of Healthcare Power of Attorney in Chart? -  ? ? ? ?Allergies  ?Allergen Reactions  ? Fosamax [Alendronate Sodium]   ?  Dental problems  ? Statins   ?  Muscle pain   ? Ace Inhibitors Cough  ? ? ?Chief Complaint  ?Patient presents with  ? Discharge Note  ?  Discharge  ? ? ?HPI:  ?81 y.o. female with PMH significant for PAF, HTN, ischemic cardiomyopathy with NSTEMI due to coronary spasms with MR and Moderate AR, Vertigo, hx of Acoustic neuroma was [facility] hospital stay for s/p fall and R pelvic rami fx, urinary retention.  ? The patient has regained physical strength, ADL function, her pain is better controlled, Foley catheter removed successfully. [facility]  ? The right elbow cellulitis, resolved, treated with Doxy.  ?  Urinary retention, resolved, off Foley, c/o occasionally dysuria, but denied increased urinary frequency or urgency, denied abd pain or general malaise. On Flomax ?Hospitalized 01/15/22-01/19/22 for fall, vertigo, pelvic rami fx, WBAT, prn Norco available, Tylenol is adequate for pain now, f/u Ortho ?f/u ENT for Hx of acoustic neuroma.  ?            Hx of Afib, on Eliquis, Carvedilol, Diltiazem, TSH 1.72  01/24/22 ?            Hyperlipidemia, diet control.  ?            HTN, takes Carvedilol, prn Furosemide, Bun/creat 20/0.87 01/24/22 ?            Mitral valve disorder, f/u cardiology.  ?            Ischemic cardiomyopathy, f/u cardiology ?            Acoustic neuroma, MRI brain with and without contrast shows ring-enhancing 1 cm x 1 cm lesion concerning for acoustic neuroma. ?Discussed with Dr. Jenne PaneBates.  Patient will follow-up with ENT outpatient, Neurology.  ?            Vertigo: prn Meclizine, f/u neurology, ENT,  ?            Hyponatremia, Na 137 01/24/22 ?            Anemia, Hgb 8.9 01/18/22>>8.4, Iron 23, Vit B23 562, 01/24/22, on Vit B12. On Iron MWF with meal ?            Constipation, takes Colace ? Chronic edema, mild prn Furosemide ?  ? ?Past Medical History:  ?Diagnosis Date  ? Anemia   ? Aortic insufficiency   ? Mild by echo 09/2020  ? Aortic stenosis   ? mild by echo 09/2019 but not present on echo 09/2020  ? Arthritis   ? Chicken pox   ? Heart attack (  HCC) 2006  ? nontransmural MI seconday to vasispasm w normal coronary arteries  ? High cholesterol   ? Ischemic dilated cardiomyopathy (HCC)   ? resolved  ? Mitral valve prolapse   ? with mild MR by echo 10/2015  ? White coat hypertension   ? ? ?Past Surgical History:  ?Procedure Laterality Date  ? acoustic neuroma   1991  ? ANKLE SURGERY  2009  ? CHOLECYSTECTOMY  1990  ? facial plastic surgery  1996  ? TONSILLECTOMY AND ADENOIDECTOMY  1946  ? ? ?  reports that she has never smoked. She has never used smokeless tobacco. She reports current alcohol use of about 7.0 standard drinks per week. She reports that she does not use drugs. ?Social History  ? ?Socioeconomic History  ? Marital status: Widowed  ?  Spouse name: Not on file  ? Number of children: Not on file  ? Years of education: Not on file  ? Highest education level: Not on file  ?Occupational History  ? Occupation: Runner, broadcasting/film/video  ?Tobacco Use  ? Smoking status: Never  ? Smokeless tobacco: Never  ?Substance and  Sexual Activity  ? Alcohol use: Yes  ?  Alcohol/week: 7.0 standard drinks  ?  Types: 7 Glasses of wine per week  ?  Comment: 1 daily  ? Drug use: No  ? Sexual activity: Not on file  ?Other Topics Concern  ? Not on file  ?Social History Narrative  ? Widowed in 2012   ? Working part time  ? ?Social Determinants of Health  ? ?Financial Resource Strain: Not on file  ?Food Insecurity: Not on file  ?Transportation Needs: Not on file  ?Physical Activity: Not on file  ?Stress: Not on file  ?Social Connections: Not on file  ?Intimate Partner Violence: Not on file  ? ?Functional Status Survey: ?  ? ?Allergies  ?Allergen Reactions  ? Fosamax [Alendronate Sodium]   ?  Dental problems  ? Statins   ?  Muscle pain   ? Ace Inhibitors Cough  ? ? ?Pertinent  Health Maintenance Due  ?Topic Date Due  ? MAMMOGRAM  01/07/2019  ? INFLUENZA VACCINE  Completed  ? DEXA SCAN  Completed  ? ? ?Medications: ?Allergies as of 02/07/2022   ? ?   Reactions  ? Fosamax [alendronate Sodium]   ? Dental problems  ? Statins   ? Muscle pain   ? Ace Inhibitors Cough  ? ?  ? ?  ?Medication List  ?  ? ?  ? Accurate as of February 07, 2022 11:59 PM. If you have any questions, ask your nurse or doctor.  ?  ?  ? ?  ? ?apixaban 5 MG Tabs tablet ?Commonly known as: Eliquis ?Take 1 tablet (5 mg total) by mouth 2 (two) times daily. ?  ?calcium-vitamin D 500-200 MG-UNIT tablet ?Take 1 tablet by mouth daily. ?  ?carvedilol 6.25 MG tablet ?Commonly known as: COREG ?Take 1 tablet (6.25 mg total) by mouth 2 (two) times daily. ?  ?diclofenac Sodium 1 % Gel ?Commonly known as: VOLTAREN ?Apply topically 4 (four) times daily. ?  ?diltiazem 30 MG tablet ?Commonly known as: Cardizem ?Take 1 tablet every 4 hours AS NEEDED for AFIB heart rate >100 ?  ?diphenhydrAMINE 25 MG tablet ?Commonly known as: BENADRYL ?Take 25 mg by mouth at bedtime as needed for allergies. ?  ?docusate sodium 100 MG capsule ?Commonly known as: COLACE ?Take 1 capsule (100 mg total) by mouth 2 (two) times  daily. ?  ?  ferrous sulfate 325 (65 FE) MG tablet ?Take 325 mg by mouth. Once A Day on Mon, Wed, Fri ?  ?furosemide 20 MG tablet ?Commonly known as: LASIX ?Take 1 tablet (20 mg total) by mouth as needed. ?  ?HYDROcodone-acetaminophen 5-325 MG tablet ?Commonly known as: NORCO/VICODIN ?Take 1-2 tablets by mouth every 6 (six) hours as needed for moderate pain or severe pain. ?  ?lactose free nutrition Liqd ?Take 237 mLs by mouth 2 (two) times daily between meals. ?  ?Lubricant Eye Nighttime Oint ?Apply 1 application to eye in the morning and at bedtime. ?  ?meclizine 12.5 MG tablet ?Commonly known as: ANTIVERT ?Take 12.5 mg by mouth 3 (three) times daily as needed for dizziness. ?  ?multivitamin with minerals Tabs tablet ?Take 1 tablet by mouth daily. ?  ?ondansetron 4 MG disintegrating tablet ?Commonly known as: ZOFRAN-ODT ?Take 1 tablet (4 mg total) by mouth every 8 (eight) hours as needed for nausea or vomiting. ?  ?vitamin B-12 100 MCG tablet ?Commonly known as: CYANOCOBALAMIN ?Take 100 mcg by mouth daily. ?  ? ?  ? ? ?Review of Systems  ?Constitutional:  Negative for activity change, appetite change and fever.  ?     Weight #156>>143<<147, fluctuating  ?HENT:  Negative for congestion and trouble swallowing.   ?Eyes:  Negative for visual disturbance.  ?Respiratory:  Negative for cough, shortness of breath and wheezing.   ?Cardiovascular:  Negative for chest pain and leg swelling.  ?Gastrointestinal:  Negative for abdominal pain and constipation.  ?Genitourinary:  Negative for difficulty urinating, dysuria, frequency and urgency.  ?     Occasional discomfort upon urination.   ?Musculoskeletal:  Positive for arthralgias and gait problem.  ?     Right hip/thigh pain with weight bearing or certain way the right leg movement-improved.   ?Skin:  Negative for color change.  ?Neurological:  Positive for dizziness, facial asymmetry and weakness. Negative for speech difficulty.  ?     Occasional dizziness.    ?Psychiatric/Behavioral:  Negative for confusion and sleep disturbance. The patient is not nervous/anxious.   ? ?Vitals:  ? 02/07/22 1629  ?BP: (!) 147/89  ?Pulse: 73  ?Resp: 18  ?Temp: (!) 96.9 ?F (36.1 ?C)  ?SpO2: 99%  ?Weight:

## 2022-02-07 NOTE — Assessment & Plan Note (Signed)
diet control.   

## 2022-02-07 NOTE — Assessment & Plan Note (Signed)
Blood pressure is controlled, takes Carvedilol, prn Furosemide, Bun/creat 20/0.87 01/24/22  ?

## 2022-02-07 NOTE — Assessment & Plan Note (Signed)
f/u ENT for Hx of acoustic neuroma.  MRI brain with and without contrast shows ring-enhancing 1 cm x 1 cm lesion concerning for acoustic neuroma. ?Discussed with Dr. Redmond Baseman.  Patient will follow-up with ENT outpatient, Neurology.  ?

## 2022-02-07 NOTE — Assessment & Plan Note (Signed)
Resolved, on Flomax, monitor for s/s of UTI in setting of c/o occasional discomfort upon urination.  ?

## 2022-02-07 NOTE — Assessment & Plan Note (Signed)
Occasional,  prn Meclizine, f/u neurology, ENT,  ?

## 2022-02-07 NOTE — Assessment & Plan Note (Signed)
f/u cardiology

## 2022-02-07 NOTE — Assessment & Plan Note (Signed)
Na 137 01/24/22 ?

## 2022-02-08 ENCOUNTER — Encounter: Payer: Self-pay | Admitting: Nurse Practitioner

## 2022-02-15 ENCOUNTER — Non-Acute Institutional Stay: Payer: Medicare Other | Admitting: Nurse Practitioner

## 2022-02-15 ENCOUNTER — Encounter: Payer: Self-pay | Admitting: Nurse Practitioner

## 2022-02-15 DIAGNOSIS — K5901 Slow transit constipation: Secondary | ICD-10-CM

## 2022-02-15 DIAGNOSIS — I10 Essential (primary) hypertension: Secondary | ICD-10-CM

## 2022-02-15 DIAGNOSIS — S32691D Other specified fracture of right ischium, subsequent encounter for fracture with routine healing: Secondary | ICD-10-CM

## 2022-02-15 DIAGNOSIS — I480 Paroxysmal atrial fibrillation: Secondary | ICD-10-CM

## 2022-02-15 DIAGNOSIS — I255 Ischemic cardiomyopathy: Secondary | ICD-10-CM

## 2022-02-15 DIAGNOSIS — Z86018 Personal history of other benign neoplasm: Secondary | ICD-10-CM

## 2022-02-15 DIAGNOSIS — E871 Hypo-osmolality and hyponatremia: Secondary | ICD-10-CM

## 2022-02-15 DIAGNOSIS — Z9889 Other specified postprocedural states: Secondary | ICD-10-CM

## 2022-02-15 DIAGNOSIS — E7800 Pure hypercholesterolemia, unspecified: Secondary | ICD-10-CM

## 2022-02-15 DIAGNOSIS — R339 Retention of urine, unspecified: Secondary | ICD-10-CM

## 2022-02-15 DIAGNOSIS — R609 Edema, unspecified: Secondary | ICD-10-CM

## 2022-02-15 DIAGNOSIS — I059 Rheumatic mitral valve disease, unspecified: Secondary | ICD-10-CM | POA: Diagnosis not present

## 2022-02-15 DIAGNOSIS — D509 Iron deficiency anemia, unspecified: Secondary | ICD-10-CM

## 2022-02-15 DIAGNOSIS — R42 Dizziness and giddiness: Secondary | ICD-10-CM

## 2022-02-15 NOTE — Progress Notes (Signed)
?Location:   [facility] ?Nursing Home Room Number: 819 A ?Place of Service:  ALF (13) ?Provider:  Lee-Ann Gal X, NP ? ?Melida Quitter, MD ? ?Patient Care Team: ?Melida Quitter, MD as PCP - General (Internal Medicine) ?Quintella Reichert, MD as PCP - Cardiology (Cardiology) ? ?Extended Emergency Contact Information ?Primary Emergency Contact: Rouch,Holly ? [place] ?Home Phone: [phone] ?Relation: Daughter ? ?Code Status:  FULL CODE ?Goals of care: Advanced Directive information ? ?  02/07/2022  ?  4:41 PM  ?Advanced Directives  ?Does Patient Have a Medical Advance Directive? Yes  ?Type of Advance Directive Healthcare Power of Attorney  ?Does patient want to make changes to medical advance directive? No - Patient declined  ? ? ? ?Chief Complaint  ?Patient presents with  ? Acute Visit  ?  Medication review  ? ? ?HPI:  ?Pt is a 81 y.o. female seen today for an acute visit for medication review, [place] [facility] ?  ?             Urinary retention, resolved, off Foley, c/o occasionally dysuria, but denied increased urinary frequency or urgency, denied abd pain or general malaise.  ?Healed pelvic rami fx, WBAT, prn Norco available, Tylenol is adequate for pain now, f/u Ortho ?f/u ENT for Hx of acoustic neuroma.  ?            Hx of Afib, on Eliquis, Carvedilol, Diltiazem, TSH 1.72 01/24/22 ?            Hyperlipidemia, diet control.  ?            HTN, takes Carvedilol, prn Furosemide, Bun/creat 20/0.87 01/24/22 ?            Mitral valve disorder, f/u cardiology.  ?            Ischemic cardiomyopathy with NSTEMI due to coronary spasms with MR and Moderate AR, f/u cardiology ?            Acoustic neuroma, MRI brain with and without contrast shows ring-enhancing 1 cm x 1 cm lesion concerning for acoustic neuroma. ?Discussed with Dr. Jenne Pane.  Patient will follow-up with ENT outpatient, Neurology.  ?            Vertigo: prn Meclizine, f/u neurology, ENT,  ?             Hyponatremia, Na 137 01/24/22 ?            Anemia, Hgb 9.2 01/31/22,  Iron 23, Vit B23 562, 01/24/22, on Vit B12. On Iron MWF with meal ?            Constipation, takes Colace ?            Chronic edema, mild prn Furosemide ?  ?Past Medical History:  ?Diagnosis Date  ? Anemia   ? Aortic insufficiency   ? Mild by echo 09/2020  ? Aortic stenosis   ? mild by echo 09/2019 but not present on echo 09/2020  ? Arthritis   ? Chicken pox   ? Heart attack [facility]) 2006  ? nontransmural MI seconday to vasispasm w normal coronary arteries  ? High cholesterol   ? Ischemic dilated cardiomyopathy (HCC)   ? resolved  ? Mitral valve prolapse   ? with mild MR by echo 10/2015  ? White coat hypertension   ? ?Past Surgical History:  ?Procedure Laterality Date  ? acoustic  neuroma   1991  ? ANKLE SURGERY  2009  ? CHOLECYSTECTOMY  1990  ? facial plastic surgery  1996  ? TONSILLECTOMY AND ADENOIDECTOMY  1946  ? ? ?Allergies  ?Allergen Reactions  ? Fosamax [Alendronate Sodium]   ?  Dental problems  ? Statins   ?  Muscle pain   ? Ace Inhibitors Cough  ? ? ?Allergies as of 02/15/2022   ? ?   Reactions  ? Fosamax [alendronate Sodium]   ? Dental problems  ? Statins   ? Muscle pain   ? Ace Inhibitors Cough  ? ?  ? ?  ?Medication List  ?  ? ?  ? Accurate as of February 15, 2022 11:59 PM. If you have any questions, ask your nurse or doctor.  ?  ?  ? ?  ? ?STOP taking these medications   ? ?lactose free nutrition Liqd ?Stopped by: Mazell Aylesworth X Nellie Pester, NP ?  ? ?  ? ?TAKE these medications   ? ?apixaban 5 MG Tabs tablet ?Commonly known as: Eliquis ?Take 1 tablet (5 mg total) by mouth 2 (two) times daily. ?  ?calcium-vitamin D 500-200 MG-UNIT tablet ?Take 1 tablet by mouth daily. ?  ?carvedilol 6.25 MG tablet ?Commonly known as: COREG ?Take 1 tablet (6.25 mg total) by mouth 2 (two) times daily. ?  ?diclofenac Sodium 1 % Gel ?Commonly known as: VOLTAREN ?Apply topically 4 (four) times daily. ?  ?diltiazem 30 MG tablet ?Commonly known as: Cardizem ?Take 1 tablet every 4  hours AS NEEDED for AFIB heart rate >100 ?  ?diphenhydrAMINE 25 MG tablet ?Commonly known as: BENADRYL ?Take 25 mg by mouth at bedtime as needed for allergies. ?  ?docusate sodium 100 MG capsule ?Commonly known as: COLACE ?Take 1 capsule (100 mg total) by mouth 2 (two) times daily. ?  ?ferrous sulfate 325 (65 FE) MG tablet ?Take 325 mg by mouth. Once A Day on Mon, Wed, Fri ?  ?furosemide 20 MG tablet ?Commonly known as: LASIX ?Take 1 tablet (20 mg total) by mouth as needed. ?  ?HYDROcodone-acetaminophen 5-325 MG tablet ?Commonly known as: NORCO/VICODIN ?Take 1-2 tablets by mouth every 6 (six) hours as needed for moderate pain or severe pain. ?  ?Lubricant Eye Nighttime Oint ?Apply 1 application to eye in the morning and at bedtime. ?  ?meclizine 12.5 MG tablet ?Commonly known as: ANTIVERT ?Take 12.5 mg by mouth 3 (three) times daily as needed for dizziness. ?  ?multivitamin with minerals Tabs tablet ?Take 1 tablet by mouth daily. ?  ?ondansetron 4 MG disintegrating tablet ?Commonly known as: ZOFRAN-ODT ?Take 1 tablet (4 mg total) by mouth every 8 (eight) hours as needed for nausea or vomiting. ?  ?vitamin B-12 100 MCG tablet ?Commonly known as: CYANOCOBALAMIN ?Take 100 mcg by mouth daily. ?  ? ?  ? ? ?Review of Systems  ?Constitutional:  Negative for activity change, appetite change and fever.  ?HENT:  Negative for congestion and trouble swallowing.   ?Eyes:  Negative for visual disturbance.  ?Respiratory:  Negative for cough, shortness of breath and wheezing.   ?Cardiovascular:  Negative for chest pain and leg swelling.  ?Gastrointestinal:  Negative for abdominal pain and constipation.  ?Genitourinary:  Negative for difficulty urinating, dysuria, frequency and urgency.  ?     Occasional discomfort upon urination.   ?Musculoskeletal:  Positive for arthralgias and gait problem.  ?Skin:  Negative for color change.  ?Neurological:  Positive for dizziness, facial asymmetry and weakness. Negative for speech  difficulty.   ?     Occasional dizziness.   ?Psychiatric/Behavioral:  Negative for confusion and sleep disturbance. The patient is not nervous/anxious.   ? ?Immunization History  ?Administered Date(s) Administered  ? Fluad Quad(high Dose 65+) 08/31/2019  ? Influenza Split 08/28/2011, 09/04/2012  ? Influenza Whole 08/31/2004, 09/11/2007, 08/23/2008, 09/05/2009, 08/21/2010  ? Influenza, High Dose Seasonal PF 09/26/2018, 09/26/2018, 09/19/2020  ? Influenza, Quadrivalent, Recombinant, Inj, Pf 09/04/2021  ? Influenza,inj,Quad PF,6+ Mos 09/02/2013, 09/22/2014, 09/11/2015, 08/30/2016, 09/24/2017  ? PFIZER(Purple Top)SARS-COV-2 Vaccination 08/16/2020, 10/13/2020, 04/10/2021  ? Pneumococcal Conjugate-13 01/12/2016  ? Pneumococcal Polysaccharide-23 06/29/2012, 01/10/2022  ? Td 11/25/1989, 10/11/2009  ? ?Pertinent  Health Maintenance Due  ?Topic Date Due  ? MAMMOGRAM  01/07/2019  ? INFLUENZA VACCINE  Completed  ? DEXA SCAN  Completed  ? ? ?  01/16/2022  ?  9:00 PM 01/17/2022  ?  9:00 AM 01/17/2022  ? 11:00 PM 01/18/2022  ? 10:17 AM 01/18/2022  ?  7:32 PM  ?Fall Risk  ?Patient Fall Risk Level High fall risk High fall risk High fall risk High fall risk High fall risk  ? ?Functional Status Survey: ?  ? ?Vitals:  ? 02/15/22 1213  ?BP: 128/64  ?Pulse: 82  ?Resp: 20  ?Temp: 97.7 ?F (36.5 ?C)  ?SpO2: 95%  ?Weight: 147 lb 9.6 oz (67 kg)  ?Height: 5\' 8"  (1.727 m)  ? ?Body mass index is 22.44 kg/m?Marland Kitchen. ?Physical Exam ?Vitals and nursing note reviewed.  ?Constitutional:   ?   Appearance: Normal appearance.  ?HENT:  ?   Head: Normocephalic and atraumatic.  ?   Nose: Nose normal.  ?   Mouth/Throat:  ?   Mouth: Mucous membranes are moist.  ?Eyes:  ?   Extraocular Movements: Extraocular movements intact.  ?   Conjunctiva/sclera: Conjunctivae normal.  ?   Pupils: Pupils are equal, round, and reactive to light.  ?Cardiovascular:  ?   Rate and Rhythm: Normal rate and regular rhythm.  ?   Heart sounds: Murmur heard.  ?Pulmonary:  ?   Effort: Pulmonary effort is  normal.  ?   Breath sounds: No rales.  ?Abdominal:  ?   General: Bowel sounds are normal.  ?   Palpations: Abdomen is soft.  ?   Tenderness: There is no abdominal tenderness.  ?Musculoskeletal:  ?   Cervic

## 2022-02-18 ENCOUNTER — Encounter: Payer: Self-pay | Admitting: Nurse Practitioner

## 2022-02-18 DIAGNOSIS — R609 Edema, unspecified: Secondary | ICD-10-CM | POA: Insufficient documentation

## 2022-02-18 NOTE — Assessment & Plan Note (Signed)
Hgb 9.2 01/31/22,  Iron 23, Vit B23 562, 01/24/22, on Vit B12. On Iron MWF with meal ?

## 2022-02-18 NOTE — Assessment & Plan Note (Signed)
Blood pressure is controlled, takes Carvedilol, prn Furosemide, Bun/creat 20/0.87 01/24/22  ?

## 2022-02-18 NOTE — Assessment & Plan Note (Signed)
Ischemic cardiomyopathy with NSTEMI due to coronary spasms with MR and Moderate AR, f/u cardiology ?

## 2022-02-18 NOTE — Assessment & Plan Note (Signed)
Healed

## 2022-02-18 NOTE — Assessment & Plan Note (Signed)
Diet control

## 2022-02-18 NOTE — Assessment & Plan Note (Signed)
Heart rate is in control,  on Eliquis, Carvedilol, Diltiazem, TSH 1.72 01/24/22 ?

## 2022-02-18 NOTE — Assessment & Plan Note (Signed)
Acoustic neuroma, MRI brain with and without contrast shows ring-enhancing 1 cm x 1 cm lesion concerning for acoustic neuroma. ?Discussed with Dr. Redmond Baseman.  Patient will follow-up with ENT outpatient, Neurology.  ?

## 2022-02-18 NOTE — Assessment & Plan Note (Signed)
On and off, prn Meclizine, f/u neurology, ENT,  ?

## 2022-02-18 NOTE — Assessment & Plan Note (Signed)
Mild, on prn Furosemide ?

## 2022-02-18 NOTE — Assessment & Plan Note (Signed)
Resolved

## 2022-02-18 NOTE — Assessment & Plan Note (Signed)
f/u cardiology

## 2022-02-18 NOTE — Assessment & Plan Note (Signed)
Stable, takes Colace.  

## 2022-02-18 NOTE — Assessment & Plan Note (Signed)
Na 137 01/24/22 ?

## 2022-04-08 ENCOUNTER — Other Ambulatory Visit: Payer: Self-pay | Admitting: Internal Medicine

## 2022-04-08 DIAGNOSIS — M810 Age-related osteoporosis without current pathological fracture: Secondary | ICD-10-CM

## 2022-05-21 ENCOUNTER — Encounter: Payer: Self-pay | Admitting: Cardiology

## 2022-05-21 ENCOUNTER — Ambulatory Visit: Payer: Medicare Other | Admitting: Cardiology

## 2022-05-21 VITALS — BP 154/70 | HR 70 | Ht 68.5 in | Wt 150.0 lb

## 2022-05-21 DIAGNOSIS — I341 Nonrheumatic mitral (valve) prolapse: Secondary | ICD-10-CM

## 2022-05-21 DIAGNOSIS — I10 Essential (primary) hypertension: Secondary | ICD-10-CM | POA: Diagnosis not present

## 2022-05-21 DIAGNOSIS — I480 Paroxysmal atrial fibrillation: Secondary | ICD-10-CM

## 2022-05-21 DIAGNOSIS — I350 Nonrheumatic aortic (valve) stenosis: Secondary | ICD-10-CM | POA: Diagnosis not present

## 2022-05-21 DIAGNOSIS — R0609 Other forms of dyspnea: Secondary | ICD-10-CM

## 2022-05-21 DIAGNOSIS — I351 Nonrheumatic aortic (valve) insufficiency: Secondary | ICD-10-CM

## 2022-05-21 DIAGNOSIS — R600 Localized edema: Secondary | ICD-10-CM

## 2022-05-21 DIAGNOSIS — R0989 Other specified symptoms and signs involving the circulatory and respiratory systems: Secondary | ICD-10-CM

## 2022-11-04 ENCOUNTER — Ambulatory Visit: Payer: Medicare Other | Admitting: Cardiology

## 2022-11-10 ENCOUNTER — Other Ambulatory Visit: Payer: Self-pay | Admitting: Cardiology

## 2022-11-11 NOTE — Telephone Encounter (Signed)
Rx refill sent to pharmacy. 

## 2022-12-12 ENCOUNTER — Other Ambulatory Visit: Payer: Self-pay | Admitting: Cardiology

## 2022-12-12 DIAGNOSIS — I480 Paroxysmal atrial fibrillation: Secondary | ICD-10-CM

## 2022-12-12 NOTE — Telephone Encounter (Signed)
Prescription refill request for Eliquis received. Indication:Afib  Last office visit: 05/21/22 Rad[place] Pax)  Scr: 0.9 (01/25/22)  Age: 82 Weight: 68kg  Appropriate dose and refill sent to requested pharmacy.

## 2024-02-27 ENCOUNTER — Other Ambulatory Visit: Payer: Self-pay | Admitting: Cardiology

## 2024-02-27 DIAGNOSIS — I480 Paroxysmal atrial fibrillation: Secondary | ICD-10-CM

## 2024-02-27 NOTE — Telephone Encounter (Signed)
 Prescription refill request for Eliquis received. Indication:afib Last office visit:needs appt EPP:IRJJO labs Age: 83 Weight:68  kg  Prescription refilled

## 2024-05-04 ENCOUNTER — Other Ambulatory Visit: Payer: Self-pay | Admitting: Cardiology

## 2024-05-04 DIAGNOSIS — I480 Paroxysmal atrial fibrillation: Secondary | ICD-10-CM
